# Patient Record
Sex: Female | Born: 1935 | Race: White | Hispanic: No | Smoking: Former smoker
Health system: Southern US, Community
[De-identification: ages and names within clinical notes are randomized; demographics above are authoritative.]

## PROBLEM LIST (undated history)

## (undated) DIAGNOSIS — D649 Anemia, unspecified: Secondary | ICD-10-CM

## (undated) DIAGNOSIS — H409 Unspecified glaucoma: Secondary | ICD-10-CM

## (undated) DIAGNOSIS — I10 Essential (primary) hypertension: Secondary | ICD-10-CM

## (undated) DIAGNOSIS — I639 Cerebral infarction, unspecified: Secondary | ICD-10-CM

## (undated) DIAGNOSIS — H269 Unspecified cataract: Secondary | ICD-10-CM

## (undated) DIAGNOSIS — K5790 Diverticulosis of intestine, part unspecified, without perforation or abscess without bleeding: Secondary | ICD-10-CM

## (undated) DIAGNOSIS — Z853 Personal history of malignant neoplasm of breast: Secondary | ICD-10-CM

## (undated) DIAGNOSIS — F32A Depression, unspecified: Secondary | ICD-10-CM

## (undated) DIAGNOSIS — R911 Solitary pulmonary nodule: Secondary | ICD-10-CM

## (undated) DIAGNOSIS — R32 Unspecified urinary incontinence: Secondary | ICD-10-CM

## (undated) DIAGNOSIS — G629 Polyneuropathy, unspecified: Secondary | ICD-10-CM

## (undated) DIAGNOSIS — F419 Anxiety disorder, unspecified: Secondary | ICD-10-CM

## (undated) DIAGNOSIS — E785 Hyperlipidemia, unspecified: Secondary | ICD-10-CM

## (undated) DIAGNOSIS — E039 Hypothyroidism, unspecified: Secondary | ICD-10-CM

## (undated) DIAGNOSIS — J309 Allergic rhinitis, unspecified: Secondary | ICD-10-CM

## (undated) DIAGNOSIS — F329 Major depressive disorder, single episode, unspecified: Secondary | ICD-10-CM

## (undated) DIAGNOSIS — N189 Chronic kidney disease, unspecified: Secondary | ICD-10-CM

## (undated) DIAGNOSIS — K589 Irritable bowel syndrome without diarrhea: Secondary | ICD-10-CM

## (undated) HISTORY — DX: Essential (primary) hypertension: I10

## (undated) HISTORY — DX: Unspecified glaucoma: H40.9

## (undated) HISTORY — DX: Personal history of malignant neoplasm of breast: Z85.3

## (undated) HISTORY — DX: Solitary pulmonary nodule: R91.1

## (undated) HISTORY — PX: VAGINAL HYSTERECTOMY: SUR661

## (undated) HISTORY — DX: Cerebral infarction, unspecified: I63.9

## (undated) HISTORY — DX: Chronic kidney disease, unspecified: N18.9

## (undated) HISTORY — DX: Hyperlipidemia, unspecified: E78.5

## (undated) HISTORY — DX: Hypothyroidism, unspecified: E03.9

## (undated) HISTORY — DX: Irritable bowel syndrome without diarrhea: K58.9

## (undated) HISTORY — DX: Allergic rhinitis, unspecified: J30.9

## (undated) HISTORY — PX: EYE SURGERY: SHX253

## (undated) HISTORY — PX: SPINE SURGERY: SHX786

## (undated) HISTORY — DX: Anxiety disorder, unspecified: F41.9

## (undated) HISTORY — DX: Unspecified cataract: H26.9

## (undated) HISTORY — DX: Major depressive disorder, single episode, unspecified: F32.9

## (undated) HISTORY — PX: OTHER SURGICAL HISTORY: SHX169

## (undated) HISTORY — DX: Polyneuropathy, unspecified: G62.9

## (undated) HISTORY — DX: Depression, unspecified: F32.A

## (undated) HISTORY — DX: Unspecified urinary incontinence: R32

## (undated) HISTORY — DX: Diverticulosis of intestine, part unspecified, without perforation or abscess without bleeding: K57.90

## (undated) HISTORY — PX: BREAST SURGERY: SHX581

## (undated) HISTORY — DX: Anemia, unspecified: D64.9

---

## 1991-01-03 ENCOUNTER — Encounter (INDEPENDENT_AMBULATORY_CARE_PROVIDER_SITE_OTHER): Payer: Self-pay | Admitting: *Deleted

## 1998-12-26 ENCOUNTER — Encounter: Payer: Self-pay | Admitting: Internal Medicine

## 1999-01-02 ENCOUNTER — Encounter: Payer: Self-pay | Admitting: Internal Medicine

## 1999-04-03 ENCOUNTER — Ambulatory Visit (HOSPITAL_COMMUNITY): Admission: RE | Admit: 1999-04-03 | Discharge: 1999-04-03 | Payer: Self-pay | Admitting: Pulmonary Disease

## 1999-04-03 ENCOUNTER — Encounter: Payer: Self-pay | Admitting: Pulmonary Disease

## 2002-03-20 ENCOUNTER — Encounter: Payer: Self-pay | Admitting: Pulmonary Disease

## 2002-03-20 ENCOUNTER — Inpatient Hospital Stay (HOSPITAL_COMMUNITY): Admission: AD | Admit: 2002-03-20 | Discharge: 2002-03-23 | Payer: Self-pay | Admitting: Pulmonary Disease

## 2002-03-20 ENCOUNTER — Encounter (INDEPENDENT_AMBULATORY_CARE_PROVIDER_SITE_OTHER): Payer: Self-pay | Admitting: *Deleted

## 2002-03-22 ENCOUNTER — Encounter: Payer: Self-pay | Admitting: Pulmonary Disease

## 2002-03-27 ENCOUNTER — Ambulatory Visit (HOSPITAL_COMMUNITY): Admission: RE | Admit: 2002-03-27 | Discharge: 2002-03-27 | Payer: Self-pay | Admitting: Pulmonary Disease

## 2002-03-27 ENCOUNTER — Encounter: Payer: Self-pay | Admitting: Pulmonary Disease

## 2003-06-04 ENCOUNTER — Ambulatory Visit (HOSPITAL_COMMUNITY): Admission: RE | Admit: 2003-06-04 | Discharge: 2003-06-04 | Payer: Self-pay | Admitting: Pulmonary Disease

## 2003-06-04 ENCOUNTER — Encounter: Payer: Self-pay | Admitting: Pulmonary Disease

## 2005-03-09 ENCOUNTER — Ambulatory Visit: Payer: Self-pay | Admitting: Pulmonary Disease

## 2005-09-17 ENCOUNTER — Ambulatory Visit: Payer: Self-pay | Admitting: Pulmonary Disease

## 2005-12-02 ENCOUNTER — Ambulatory Visit: Payer: Self-pay | Admitting: Pulmonary Disease

## 2006-02-25 ENCOUNTER — Ambulatory Visit: Payer: Self-pay | Admitting: Pulmonary Disease

## 2006-07-22 ENCOUNTER — Ambulatory Visit: Payer: Self-pay | Admitting: Pulmonary Disease

## 2006-09-15 ENCOUNTER — Ambulatory Visit: Payer: Self-pay | Admitting: Pulmonary Disease

## 2007-03-14 ENCOUNTER — Ambulatory Visit: Payer: Self-pay | Admitting: Pulmonary Disease

## 2007-03-14 ENCOUNTER — Ambulatory Visit: Payer: Self-pay | Admitting: Family Medicine

## 2007-03-14 LAB — CONVERTED CEMR LAB
ALT: 12 units/L (ref 0–40)
AST: 23 units/L (ref 0–37)
Albumin: 4.1 g/dL (ref 3.5–5.2)
Alkaline Phosphatase: 35 units/L — ABNORMAL LOW (ref 39–117)
Basophils Absolute: 0 10*3/uL (ref 0.0–0.1)
CO2: 30 meq/L (ref 19–32)
Chloride: 102 meq/L (ref 96–112)
Cholesterol: 178 mg/dL (ref 0–200)
Creatinine, Ser: 1.1 mg/dL (ref 0.4–1.2)
Eosinophils Relative: 0.7 % (ref 0.0–5.0)
GFR calc Af Amer: 62 mL/min
Glucose, Bld: 93 mg/dL (ref 70–99)
Hemoglobin: 13.7 g/dL (ref 12.0–15.0)
LDL Cholesterol: 73 mg/dL (ref 0–99)
Monocytes Relative: 9.6 % (ref 3.0–11.0)
Neutro Abs: 5 10*3/uL (ref 1.4–7.7)
Neutrophils Relative %: 69.1 % (ref 43.0–77.0)
RBC: 4.08 M/uL (ref 3.87–5.11)
RDW: 12 % (ref 11.5–14.6)
TSH: 0.33 microintl units/mL — ABNORMAL LOW (ref 0.35–5.50)
VLDL: 34 mg/dL (ref 0–40)

## 2007-09-16 ENCOUNTER — Ambulatory Visit: Payer: Self-pay | Admitting: Pulmonary Disease

## 2007-09-17 DIAGNOSIS — E7800 Pure hypercholesterolemia, unspecified: Secondary | ICD-10-CM | POA: Insufficient documentation

## 2007-09-17 DIAGNOSIS — K219 Gastro-esophageal reflux disease without esophagitis: Secondary | ICD-10-CM

## 2007-09-17 DIAGNOSIS — I10 Essential (primary) hypertension: Secondary | ICD-10-CM

## 2007-09-17 DIAGNOSIS — K589 Irritable bowel syndrome without diarrhea: Secondary | ICD-10-CM

## 2007-09-17 HISTORY — DX: Irritable bowel syndrome, unspecified: K58.9

## 2007-09-20 ENCOUNTER — Ambulatory Visit (HOSPITAL_BASED_OUTPATIENT_CLINIC_OR_DEPARTMENT_OTHER): Admission: RE | Admit: 2007-09-20 | Discharge: 2007-09-20 | Payer: Self-pay | Admitting: Urology

## 2007-10-26 ENCOUNTER — Ambulatory Visit: Payer: Self-pay | Admitting: Pulmonary Disease

## 2007-10-26 ENCOUNTER — Ambulatory Visit (HOSPITAL_COMMUNITY): Admission: RE | Admit: 2007-10-26 | Discharge: 2007-10-26 | Payer: Self-pay | Admitting: Pulmonary Disease

## 2007-10-26 LAB — CONVERTED CEMR LAB
Chloride: 101 meq/L (ref 96–112)
GFR calc non Af Amer: 51 mL/min
Potassium: 4.5 meq/L (ref 3.5–5.1)

## 2007-10-27 ENCOUNTER — Telehealth: Payer: Self-pay | Admitting: Pulmonary Disease

## 2008-03-14 ENCOUNTER — Ambulatory Visit: Payer: Self-pay | Admitting: Pulmonary Disease

## 2008-03-14 DIAGNOSIS — R32 Unspecified urinary incontinence: Secondary | ICD-10-CM

## 2008-03-14 DIAGNOSIS — J309 Allergic rhinitis, unspecified: Secondary | ICD-10-CM

## 2008-03-14 DIAGNOSIS — E039 Hypothyroidism, unspecified: Secondary | ICD-10-CM | POA: Insufficient documentation

## 2008-03-14 DIAGNOSIS — K5790 Diverticulosis of intestine, part unspecified, without perforation or abscess without bleeding: Secondary | ICD-10-CM

## 2008-03-14 HISTORY — DX: Diverticulosis of intestine, part unspecified, without perforation or abscess without bleeding: K57.90

## 2008-03-18 LAB — CONVERTED CEMR LAB
Basophils Absolute: 0 10*3/uL (ref 0.0–0.1)
Bilirubin, Direct: 0.1 mg/dL (ref 0.0–0.3)
CO2: 27 meq/L (ref 19–32)
Chloride: 100 meq/L (ref 96–112)
Cholesterol: 222 mg/dL (ref 0–200)
Creatinine, Ser: 1.1 mg/dL (ref 0.4–1.2)
Eosinophils Absolute: 0.1 10*3/uL (ref 0.0–0.7)
GFR calc Af Amer: 62 mL/min
HCT: 40 % (ref 36.0–46.0)
HDL: 108.5 mg/dL (ref >39.0)
Hemoglobin: 13.7 g/dL (ref 12.0–15.0)
MCHC: 34.1 g/dL (ref 30.0–36.0)
MCV: 96.6 fL (ref 78.0–100.0)
Neutro Abs: 2.9 10*3/uL (ref 1.4–7.7)
Platelets: 334 10*3/uL (ref 150–400)
Potassium: 3.6 meq/L (ref 3.5–5.1)
RDW: 11.9 % (ref 11.5–14.6)
Sodium: 139 meq/L (ref 135–145)
TSH: 1.33 microintl units/mL (ref 0.35–5.50)
Total Bilirubin: 1 mg/dL (ref 0.3–1.2)
Total Protein: 7.9 g/dL (ref 6.0–8.3)
Vit D, 1,25-Dihydroxy: 37 (ref 30–89)

## 2008-05-11 ENCOUNTER — Telehealth (INDEPENDENT_AMBULATORY_CARE_PROVIDER_SITE_OTHER): Payer: Self-pay | Admitting: *Deleted

## 2008-05-24 ENCOUNTER — Telehealth (INDEPENDENT_AMBULATORY_CARE_PROVIDER_SITE_OTHER): Payer: Self-pay

## 2008-06-26 ENCOUNTER — Telehealth (INDEPENDENT_AMBULATORY_CARE_PROVIDER_SITE_OTHER): Payer: Self-pay | Admitting: *Deleted

## 2008-07-03 ENCOUNTER — Encounter: Payer: Self-pay | Admitting: Pulmonary Disease

## 2008-07-12 ENCOUNTER — Encounter: Payer: Self-pay | Admitting: Pulmonary Disease

## 2008-07-19 ENCOUNTER — Telehealth: Payer: Self-pay | Admitting: Pulmonary Disease

## 2008-08-21 ENCOUNTER — Ambulatory Visit: Payer: Self-pay | Admitting: Pulmonary Disease

## 2008-09-02 DIAGNOSIS — H409 Unspecified glaucoma: Secondary | ICD-10-CM

## 2008-10-31 ENCOUNTER — Telehealth: Payer: Self-pay | Admitting: Pulmonary Disease

## 2008-12-05 ENCOUNTER — Telehealth (INDEPENDENT_AMBULATORY_CARE_PROVIDER_SITE_OTHER): Payer: Self-pay | Admitting: *Deleted

## 2009-02-20 ENCOUNTER — Ambulatory Visit: Payer: Self-pay | Admitting: Pulmonary Disease

## 2009-02-20 LAB — CONVERTED CEMR LAB
ALT: 11 units/L (ref 0–35)
BUN: 17 mg/dL (ref 6–23)
Bilirubin, Direct: 0.1 mg/dL (ref 0.0–0.3)
CO2: 26 meq/L (ref 19–32)
Cholesterol: 179 mg/dL (ref 0–200)
Eosinophils Absolute: 0.1 10*3/uL (ref 0.0–0.7)
Eosinophils Relative: 1.2 % (ref 0.0–5.0)
GFR calc non Af Amer: 56.63 mL/min (ref >60)
HDL: 80.6 mg/dL (ref >39.00)
Lymphocytes Relative: 26.4 % (ref 12.0–46.0)
Lymphs Abs: 1.6 10*3/uL (ref 0.7–4.0)
Monocytes Relative: 12 % (ref 3.0–12.0)
Platelets: 289 10*3/uL (ref 150.0–400.0)
Potassium: 3.3 meq/L — ABNORMAL LOW (ref 3.5–5.1)
Total CHOL/HDL Ratio: 2
Total Protein: 7.2 g/dL (ref 6.0–8.3)
Triglycerides: 176 mg/dL — ABNORMAL HIGH (ref 0.0–149.0)
VLDL: 35.2 mg/dL (ref 0.0–40.0)
WBC: 6 10*3/uL (ref 4.5–10.5)

## 2009-03-21 ENCOUNTER — Telehealth (INDEPENDENT_AMBULATORY_CARE_PROVIDER_SITE_OTHER): Payer: Self-pay | Admitting: *Deleted

## 2009-04-17 ENCOUNTER — Encounter: Payer: Self-pay | Admitting: Pulmonary Disease

## 2009-04-19 ENCOUNTER — Telehealth (INDEPENDENT_AMBULATORY_CARE_PROVIDER_SITE_OTHER): Payer: Self-pay | Admitting: *Deleted

## 2009-04-26 ENCOUNTER — Ambulatory Visit: Payer: Self-pay | Admitting: Pulmonary Disease

## 2009-04-26 ENCOUNTER — Telehealth (INDEPENDENT_AMBULATORY_CARE_PROVIDER_SITE_OTHER): Payer: Self-pay | Admitting: *Deleted

## 2009-05-03 ENCOUNTER — Telehealth (INDEPENDENT_AMBULATORY_CARE_PROVIDER_SITE_OTHER): Payer: Self-pay | Admitting: *Deleted

## 2009-08-21 ENCOUNTER — Ambulatory Visit: Payer: Self-pay | Admitting: Pulmonary Disease

## 2009-08-21 ENCOUNTER — Ambulatory Visit: Payer: Self-pay | Admitting: Family Medicine

## 2009-08-27 LAB — CONVERTED CEMR LAB
Albumin: 4.1 g/dL (ref 3.5–5.2)
Alkaline Phosphatase: 32 units/L — ABNORMAL LOW (ref 39–117)
Bilirubin, Direct: 0.1 mg/dL (ref 0.0–0.3)
GFR calc non Af Amer: 63.87 mL/min (ref >60)
Glucose, Bld: 93 mg/dL (ref 70–99)
HDL: 77.3 mg/dL (ref >39.00)
LDL Cholesterol: 70 mg/dL (ref 0–99)
Sodium: 139 meq/L (ref 135–145)
Total Bilirubin: 0.5 mg/dL (ref 0.3–1.2)
Total Protein: 7.5 g/dL (ref 6.0–8.3)
Triglycerides: 174 mg/dL — ABNORMAL HIGH (ref 0.0–149.0)
VLDL: 34.8 mg/dL (ref 0.0–40.0)

## 2009-08-30 ENCOUNTER — Encounter: Payer: Self-pay | Admitting: Pulmonary Disease

## 2009-09-05 ENCOUNTER — Encounter: Payer: Self-pay | Admitting: Pulmonary Disease

## 2009-09-06 ENCOUNTER — Telehealth: Payer: Self-pay | Admitting: Pulmonary Disease

## 2009-09-09 ENCOUNTER — Encounter: Payer: Self-pay | Admitting: Pulmonary Disease

## 2009-09-12 ENCOUNTER — Encounter: Payer: Self-pay | Admitting: Pulmonary Disease

## 2009-09-26 ENCOUNTER — Encounter (INDEPENDENT_AMBULATORY_CARE_PROVIDER_SITE_OTHER): Payer: Self-pay | Admitting: General Surgery

## 2009-09-26 ENCOUNTER — Ambulatory Visit (HOSPITAL_COMMUNITY): Admission: RE | Admit: 2009-09-26 | Discharge: 2009-09-27 | Payer: Self-pay | Admitting: General Surgery

## 2009-10-04 ENCOUNTER — Encounter: Payer: Self-pay | Admitting: Pulmonary Disease

## 2009-10-07 ENCOUNTER — Ambulatory Visit: Payer: Self-pay | Admitting: Oncology

## 2009-10-11 ENCOUNTER — Encounter: Payer: Self-pay | Admitting: Pulmonary Disease

## 2009-10-15 ENCOUNTER — Ambulatory Visit: Admission: RE | Admit: 2009-10-15 | Discharge: 2009-11-06 | Payer: Self-pay | Admitting: Radiation Oncology

## 2009-10-16 ENCOUNTER — Encounter: Payer: Self-pay | Admitting: Pulmonary Disease

## 2009-10-18 ENCOUNTER — Encounter: Payer: Self-pay | Admitting: Pulmonary Disease

## 2009-10-18 LAB — COMPREHENSIVE METABOLIC PANEL
ALT: 18 U/L (ref 0–35)
AST: 24 U/L (ref 0–37)
Albumin: 4.6 g/dL (ref 3.5–5.2)
Alkaline Phosphatase: 41 U/L (ref 39–117)
BUN: 21 mg/dL (ref 6–23)
Calcium: 10.2 mg/dL (ref 8.4–10.5)
Chloride: 101 mEq/L (ref 96–112)
Creatinine, Ser: 1.22 mg/dL — ABNORMAL HIGH (ref 0.40–1.20)
Potassium: 4 mEq/L (ref 3.5–5.3)

## 2009-10-18 LAB — CBC WITH DIFFERENTIAL/PLATELET
BASO%: 0.7 % (ref 0.0–2.0)
Basophils Absolute: 0 10*3/uL (ref 0.0–0.1)
EOS%: 3.6 % (ref 0.0–7.0)
MCH: 33.6 pg (ref 25.1–34.0)
MCHC: 34.4 g/dL (ref 31.5–36.0)
MCV: 97.6 fL (ref 79.5–101.0)
MONO%: 15.6 % — ABNORMAL HIGH (ref 0.0–14.0)
RDW: 12.7 % (ref 11.2–14.5)
lymph#: 2.3 10*3/uL (ref 0.9–3.3)

## 2009-11-25 ENCOUNTER — Telehealth: Payer: Self-pay | Admitting: Pulmonary Disease

## 2009-12-05 ENCOUNTER — Encounter: Payer: Self-pay | Admitting: Pulmonary Disease

## 2010-01-07 ENCOUNTER — Encounter: Payer: Self-pay | Admitting: Pulmonary Disease

## 2010-01-15 ENCOUNTER — Ambulatory Visit: Payer: Self-pay | Admitting: Oncology

## 2010-01-17 ENCOUNTER — Encounter: Payer: Self-pay | Admitting: Pulmonary Disease

## 2010-01-17 LAB — COMPREHENSIVE METABOLIC PANEL
ALT: 16 U/L (ref 0–35)
AST: 22 U/L (ref 0–37)
Glucose, Bld: 88 mg/dL (ref 70–99)
Potassium: 3.9 mEq/L (ref 3.5–5.3)

## 2010-01-17 LAB — CBC WITH DIFFERENTIAL/PLATELET
BASO%: 0.6 % (ref 0.0–2.0)
Basophils Absolute: 0 10*3/uL (ref 0.0–0.1)
EOS%: 2.6 % (ref 0.0–7.0)
Eosinophils Absolute: 0.1 10*3/uL (ref 0.0–0.5)
LYMPH%: 43.8 % (ref 14.0–49.7)
MCH: 33.9 pg (ref 25.1–34.0)
MONO#: 0.6 10*3/uL (ref 0.1–0.9)
MONO%: 15.5 % — ABNORMAL HIGH (ref 0.0–14.0)
NEUT#: 1.5 10*3/uL (ref 1.5–6.5)
NEUT%: 37.5 % — ABNORMAL LOW (ref 38.4–76.8)
Platelets: 246 10*3/uL (ref 145–400)
RDW: 13 % (ref 11.2–14.5)
lymph#: 1.8 10*3/uL (ref 0.9–3.3)

## 2010-02-19 ENCOUNTER — Ambulatory Visit: Payer: Self-pay | Admitting: Pulmonary Disease

## 2010-02-19 DIAGNOSIS — C50919 Malignant neoplasm of unspecified site of unspecified female breast: Secondary | ICD-10-CM

## 2010-02-23 LAB — CONVERTED CEMR LAB
ALT: 21 units/L (ref 0–35)
BUN: 31 mg/dL — ABNORMAL HIGH (ref 6–23)
Basophils Absolute: 0 10*3/uL (ref 0.0–0.1)
Basophils Relative: 0.8 % (ref 0.0–3.0)
CO2: 30 meq/L (ref 19–32)
Calcium: 10.7 mg/dL — ABNORMAL HIGH (ref 8.4–10.5)
Cholesterol: 199 mg/dL (ref 0–200)
Creatinine, Ser: 1.4 mg/dL — ABNORMAL HIGH (ref 0.4–1.2)
Eosinophils Absolute: 0.1 10*3/uL (ref 0.0–0.7)
HDL: 87.5 mg/dL (ref >39.00)
Lymphocytes Relative: 37.6 % (ref 12.0–46.0)
MCHC: 33.8 g/dL (ref 30.0–36.0)
MCV: 98.3 fL (ref 78.0–100.0)
Monocytes Relative: 13.6 % — ABNORMAL HIGH (ref 3.0–12.0)
Neutro Abs: 2.2 10*3/uL (ref 1.4–7.7)
Neutrophils Relative %: 45.6 % (ref 43.0–77.0)
Platelets: 283 10*3/uL (ref 150.0–400.0)
RBC: 3.8 M/uL — ABNORMAL LOW (ref 3.87–5.11)
RDW: 12.9 % (ref 11.5–14.6)
Total CHOL/HDL Ratio: 2
VLDL: 27.8 mg/dL (ref 0.0–40.0)

## 2010-04-14 ENCOUNTER — Ambulatory Visit: Payer: Self-pay | Admitting: Oncology

## 2010-04-16 ENCOUNTER — Encounter: Payer: Self-pay | Admitting: Pulmonary Disease

## 2010-04-16 LAB — COMPREHENSIVE METABOLIC PANEL
ALT: 12 U/L (ref 0–35)
AST: 20 U/L (ref 0–37)
BUN: 36 mg/dL — ABNORMAL HIGH (ref 6–23)
Chloride: 105 mEq/L (ref 96–112)
Glucose, Bld: 93 mg/dL (ref 70–99)
Total Bilirubin: 0.2 mg/dL — ABNORMAL LOW (ref 0.3–1.2)

## 2010-04-16 LAB — CBC WITH DIFFERENTIAL/PLATELET
LYMPH%: 35 % (ref 14.0–49.7)
MCHC: 34.4 g/dL (ref 31.5–36.0)
MCV: 97.3 fL (ref 79.5–101.0)
MONO#: 0.9 10*3/uL (ref 0.1–0.9)
NEUT#: 2.2 10*3/uL (ref 1.5–6.5)
RBC: 3.65 10*6/uL — ABNORMAL LOW (ref 3.70–5.45)
RDW: 12.7 % (ref 11.2–14.5)
WBC: 5 10*3/uL (ref 3.9–10.3)
lymph#: 1.7 10*3/uL (ref 0.9–3.3)

## 2010-05-29 ENCOUNTER — Encounter: Payer: Self-pay | Admitting: Pulmonary Disease

## 2010-05-30 ENCOUNTER — Telehealth: Payer: Self-pay | Admitting: Pulmonary Disease

## 2010-06-08 ENCOUNTER — Inpatient Hospital Stay (HOSPITAL_COMMUNITY): Admission: EM | Admit: 2010-06-08 | Discharge: 2010-06-10 | Payer: Self-pay | Admitting: Emergency Medicine

## 2010-06-09 ENCOUNTER — Encounter (INDEPENDENT_AMBULATORY_CARE_PROVIDER_SITE_OTHER): Payer: Self-pay | Admitting: Neurology

## 2010-06-10 ENCOUNTER — Encounter (INDEPENDENT_AMBULATORY_CARE_PROVIDER_SITE_OTHER): Payer: Self-pay | Admitting: *Deleted

## 2010-06-11 ENCOUNTER — Telehealth: Payer: Self-pay | Admitting: Pulmonary Disease

## 2010-06-26 ENCOUNTER — Ambulatory Visit: Payer: Self-pay | Admitting: Pulmonary Disease

## 2010-06-27 DIAGNOSIS — I679 Cerebrovascular disease, unspecified: Secondary | ICD-10-CM

## 2010-07-15 ENCOUNTER — Ambulatory Visit: Payer: Self-pay | Admitting: Oncology

## 2010-07-16 ENCOUNTER — Encounter: Payer: Self-pay | Admitting: Pulmonary Disease

## 2010-07-17 ENCOUNTER — Encounter: Payer: Self-pay | Admitting: Pulmonary Disease

## 2010-07-17 LAB — COMPREHENSIVE METABOLIC PANEL
ALT: 11 U/L (ref 0–35)
Albumin: 4.4 g/dL (ref 3.5–5.2)
Alkaline Phosphatase: 59 U/L (ref 39–117)
CO2: 25 mEq/L (ref 19–32)
Chloride: 104 mEq/L (ref 96–112)
Creatinine, Ser: 1.28 mg/dL — ABNORMAL HIGH (ref 0.40–1.20)
Sodium: 142 mEq/L (ref 135–145)
Total Bilirubin: 0.3 mg/dL (ref 0.3–1.2)

## 2010-07-17 LAB — CBC WITH DIFFERENTIAL/PLATELET
BASO%: 0.7 % (ref 0.0–2.0)
Eosinophils Absolute: 0.1 10*3/uL (ref 0.0–0.5)
HCT: 32.8 % — ABNORMAL LOW (ref 34.8–46.6)
HGB: 11.5 g/dL — ABNORMAL LOW (ref 11.6–15.9)
MCH: 33.9 pg (ref 25.1–34.0)
MCHC: 35 g/dL (ref 31.5–36.0)
MCV: 96.8 fL (ref 79.5–101.0)
MONO%: 19.1 % — ABNORMAL HIGH (ref 0.0–14.0)

## 2010-09-03 ENCOUNTER — Encounter: Payer: Self-pay | Admitting: Pulmonary Disease

## 2010-10-02 ENCOUNTER — Encounter: Payer: Self-pay | Admitting: Pulmonary Disease

## 2010-11-04 ENCOUNTER — Ambulatory Visit: Payer: Self-pay | Admitting: Oncology

## 2010-11-06 ENCOUNTER — Encounter: Payer: Self-pay | Admitting: Pulmonary Disease

## 2010-11-06 LAB — CBC WITH DIFFERENTIAL/PLATELET
BASO%: 0.7 % (ref 0.0–2.0)
HCT: 33.3 % — ABNORMAL LOW (ref 34.8–46.6)
MCH: 33.7 pg (ref 25.1–34.0)
MCHC: 34.3 g/dL (ref 31.5–36.0)
MCV: 98.1 fL (ref 79.5–101.0)
MONO%: 18.5 % — ABNORMAL HIGH (ref 0.0–14.0)
Platelets: 355 10*3/uL (ref 145–400)
WBC: 4.8 10*3/uL (ref 3.9–10.3)

## 2010-11-06 LAB — COMPREHENSIVE METABOLIC PANEL
Chloride: 104 mEq/L (ref 96–112)
Creatinine, Ser: 1.41 mg/dL — ABNORMAL HIGH (ref 0.40–1.20)
Potassium: 4.5 mEq/L (ref 3.5–5.3)
Sodium: 140 mEq/L (ref 135–145)
Total Bilirubin: 0.4 mg/dL (ref 0.3–1.2)

## 2010-12-25 ENCOUNTER — Ambulatory Visit: Admit: 2010-12-25 | Payer: Self-pay | Admitting: Pulmonary Disease

## 2010-12-25 ENCOUNTER — Ambulatory Visit (INDEPENDENT_AMBULATORY_CARE_PROVIDER_SITE_OTHER): Payer: Medicare Other | Admitting: Pulmonary Disease

## 2010-12-25 ENCOUNTER — Other Ambulatory Visit: Payer: Self-pay | Admitting: Pulmonary Disease

## 2010-12-25 ENCOUNTER — Other Ambulatory Visit: Payer: Medicare Other

## 2010-12-25 ENCOUNTER — Telehealth: Payer: Self-pay | Admitting: Internal Medicine

## 2010-12-25 ENCOUNTER — Encounter: Payer: Self-pay | Admitting: Pulmonary Disease

## 2010-12-25 DIAGNOSIS — D649 Anemia, unspecified: Secondary | ICD-10-CM

## 2010-12-25 DIAGNOSIS — E7800 Pure hypercholesterolemia, unspecified: Secondary | ICD-10-CM

## 2010-12-25 DIAGNOSIS — I10 Essential (primary) hypertension: Secondary | ICD-10-CM

## 2010-12-25 DIAGNOSIS — I679 Cerebrovascular disease, unspecified: Secondary | ICD-10-CM

## 2010-12-25 DIAGNOSIS — E039 Hypothyroidism, unspecified: Secondary | ICD-10-CM

## 2010-12-25 DIAGNOSIS — E785 Hyperlipidemia, unspecified: Secondary | ICD-10-CM

## 2010-12-25 LAB — HEPATIC FUNCTION PANEL
ALT: 11 U/L (ref 0–35)
AST: 22 U/L (ref 0–37)
Alkaline Phosphatase: 46 U/L (ref 39–117)
Bilirubin, Direct: 0.1 mg/dL (ref 0.0–0.3)

## 2010-12-25 LAB — BASIC METABOLIC PANEL
Calcium: 10.1 mg/dL (ref 8.4–10.5)
Creatinine, Ser: 1.3 mg/dL — ABNORMAL HIGH (ref 0.4–1.2)
GFR: 40.21 mL/min — ABNORMAL LOW (ref >60.00)

## 2010-12-25 LAB — CBC WITH DIFFERENTIAL/PLATELET
Basophils Absolute: 0 10*3/uL (ref 0.0–0.1)
Eosinophils Absolute: 0.1 10*3/uL (ref 0.0–0.7)
Eosinophils Relative: 1.7 % (ref 0.0–5.0)
Lymphocytes Relative: 28.3 % (ref 12.0–46.0)
Lymphs Abs: 1.4 10*3/uL (ref 0.7–4.0)
Monocytes Absolute: 0.7 10*3/uL (ref 0.1–1.0)
Neutro Abs: 2.8 10*3/uL (ref 1.4–7.7)
RDW: 13.1 % (ref 11.5–14.6)

## 2010-12-25 LAB — LIPID PANEL
Total CHOL/HDL Ratio: 3
VLDL: 32.6 mg/dL (ref 0.0–40.0)

## 2010-12-25 NOTE — Progress Notes (Signed)
Summary: rx req/ cough/ ears  Phone Note Call from Patient   Caller: Patient Call For: nadel Summary of Call: dry cough. ears stopped up. req rx/ nurse [facility]. [facility] [address]  Initial call taken by: Tivis Ringer,  May 11, 2008 2:18 PM  Follow-up for Phone Call        Dry cough x3-4 days. PND and ears pop. Pt is taking Zyrtec for allergies but would like to have something called to pharmacy for the cough. Michel Bickers [facility]  May 11, 2008 2:23 PM  Additional Follow-up for Phone Call Additional follow up Details #1::        per SN, pt is to take Mucinex DM 2 twice a day with lots of fluids, and tussionex 1tsp every 12 hours as needed for cough.  pt is aware, script called to pharmacy. Additional Follow-up by: Boone Master CNA,  May 11, 2008 4:55 PM    New/Updated Medications: MUCINEX DM 30-600 MG  TB12 (DEXTROMETHORPHAN-GUAIFENESIN) take 2 tablets by mouth two times a day with lots of fluids TUSSIONEX PENNKINETIC ER 8-10 MG/5ML  LQCR (CHLORPHENIRAMINE-HYDROCODONE) take one teaspoon by mouth every 12 hours as needed for cough   Prescriptions: TUSSIONEX PENNKINETIC ER 8-10 MG/5ML  LQCR (CHLORPHENIRAMINE-HYDROCODONE) take one teaspoon by mouth every 12 hours as needed for cough  #4 oz x 0   Entered and Authorized by:   Marijo File CMA   Signed by:   Boone Master CNA on 05/11/2008   Method used:   Telephoned to ...       [facility] [address]  [id number]       [address]       [place], [place]  [address]       Ph: [phone] or [phone]       Fax: [phone]   RxID:   [id number]

## 2010-12-25 NOTE — Letter (Signed)
Summary: [facility]   Imported By: Lester [place] 01/31/2010 09:20:45  _____________________________________________________________________  External Attachment:    Type:   Image     Comment:   External Document

## 2010-12-25 NOTE — Letter (Signed)
Summary: [facility]  [facility]   Imported By: Sherian Rein 08/04/2010 13:28:37  _____________________________________________________________________  External Attachment:    Type:   Image     Comment:   External Document

## 2010-12-25 NOTE — Letter (Signed)
[facility]   Imported By: Sherian Rein 05/12/2010 09:23:12  _____________________________________________________________________  External Attachment:    Type:   Image     Comment:   External Document

## 2010-12-25 NOTE — Letter (Signed)
[facility]   Imported By: Lester [place] 02/12/2010 09:25:15  _____________________________________________________________________  External Attachment:    Type:   Image     Comment:   External Document

## 2010-12-25 NOTE — Letter (Signed)
Summary: [facility]   Imported By: Sherian Rein 10/27/2010 08:34:18  _____________________________________________________________________  External Attachment:    Type:   Image     Comment:   External Document

## 2010-12-25 NOTE — Letter (Signed)
Summary: [facility]  [facility]   Imported By: Sherian Rein 11/18/2010 15:56:45  _____________________________________________________________________  External Attachment:    Type:   Image     Comment:   External Document

## 2010-12-25 NOTE — Letter (Signed)
Summary: [facility]   Imported By: Lester [place] 12/21/2009 11:35:48  _____________________________________________________________________  External Attachment:    Type:   Image     Comment:   External Document

## 2010-12-25 NOTE — Progress Notes (Signed)
Summary: need ov visit  Phone Note Call from Patient   Caller: Patient Call For: Adel Neyer Summary of Call: pt need 1 to 2 week hfu Initial call taken by: Rickard Patience,  June 11, 2010 8:16 AM  Follow-up for Phone Call        called and spoke with pt ---scheduled appt for 8-4 at 10:30 for hfu for pt.  pt is aware of date and time of appt. Randell Loop CMA  June 11, 2010 8:49 AM

## 2010-12-25 NOTE — Miscellaneous (Signed)
Summary: Plan of Care & Treatment/[facility]  Plan of Care & Treatment/[facility]   Imported By: Sherian Rein 07/23/2010 13:54:26  _____________________________________________________________________  External Attachment:    Type:   Image     Comment:   External Document

## 2010-12-25 NOTE — Progress Notes (Signed)
Summary: prescription  Phone Note Call from Patient Call back at Home Phone [phone]   Caller: Patient Call For: nadel Summary of Call: wants to speak to nurse, re: prescription. Initial call taken by: Darletta Moll,  November 25, 2009 8:39 AM  Follow-up for Phone Call        Pt request refills on Lisinopril, Atenolol, Palvix, simvastatin, levoxyl, potassium chloride. Pt also states she has stopped taking estradiol. I have removed this from med list. Pt requst rx be [facility]. RX sent. pt aware. Carron Curie CMA  November 25, 2009 9:36 AM     Prescriptions: LEVOXYL 50 MCG  TABS (LEVOTHYROXINE SODIUM) take one tablet on an empty stomach  #90 x 3   Entered by:   Carron Curie CMA   Authorized by:   Michele Mcalpine MD   Signed by:   Carron Curie CMA on 11/25/2009   Method used:   Faxed to ...       [facility]             , [place]         Ph:        Fax: [phone]   RxID:   [id number] SIMVASTATIN 20 MG  TABS (SIMVASTATIN) take one tablet at bedtime  #90 x 3   Entered by:   Carron Curie CMA   Authorized by:   Michele Mcalpine MD   Signed by:   Carron Curie CMA on 11/25/2009   Method used:   Faxed to ...       [facility]order[facility]             , [place]         Ph:        Fax: [phone]   RxID:   [id number] POTASSIUM CHLORIDE CRYS CR 20 MEQ  TBCR (POTASSIUM CHLORIDE CRYS CR) Take 1 tablet by mouth once a day  #90 x 3   Entered by:   Carron Curie CMA   Authorized by:   Michele Mcalpine MD   Signed by:   Carron Curie CMA on 11/25/2009   Method used:   Faxed to ...       [facility]order)             , [place]         Ph:        Fax: [phone]   RxID:   [id number] LISINOPRIL 20 MG  TABS (LISINOPRIL) take 1 tab by mouth once daily...  #90 x 3   Entered by:   Carron Curie CMA   Authorized by:    Michele Mcalpine MD   Signed by:   Carron Curie CMA on 11/25/2009   Method used:   Faxed to ...       [facility]             , [place]         Ph:        Fax: [phone]   RxID:   [id number] ATENOLOL-CHLORTHALIDONE 50-25 MG  TABS (ATENOLOL-CHLORTHALIDONE) Take 1 tablet by mouth once a day  #90 x 3   Entered by:   Carron Curie CMA   Authorized by:   Michele Mcalpine MD   Signed by:   Carron Curie CMA on 11/25/2009   Method used:   Faxed to .[facility] Kitchen[facility]  [facility]             , [place]         Ph:        Fax: [phone]   RxID:   [id number] PLAVIX 75 MG TABS (CLOPIDOGREL BISULFATE) take 1 tab by mouth once daily...  #90 x 3   Entered by:   Carron Curie CMA   Authorized by:   Michele Mcalpine MD   Signed by:   Carron Curie CMA on 11/25/2009   Method used:   Faxed to ...       [facility]             , [place]         Ph:        Fax: [phone]   RxID:   [id number]

## 2010-12-25 NOTE — Progress Notes (Signed)
Summary: JOINT PAIN  Phone Note Call from Patient   Caller: Patient Call For: NADEL Summary of Call: PAIN IN JOINTS ARMS  ELBOW AND FINGERS [facility] Initial call taken by: Rickard Patience,  May 30, 2010 9:43 AM  Follow-up for Phone Call        Pt c/o right elbow hot to the touch, bilateral elbow, hand pain x 1 week, not getting better even with taking Tylenol Arthritis 650mg  twice a day, and "just don't feel good". Please advise. Thanks. Zackery Barefoot CMA  May 30, 2010 10:00 AM    Additional Follow-up for Phone Call Additional follow up Details #1::        per SN---call in medrol dosepak  or a sterapred dosepak  5mg   6 day pack  #1  take as directed.  thanks Randell Loop CMA  May 30, 2010 11:41 AM     Additional Follow-up for Phone Call Additional follow up Details #2::    Rx sent pt informed. Zackery Barefoot CMA  May 30, 2010 11:56 AM   New/Updated Medications: PREDNISONE (PAK) 5 MG TABS (PREDNISONE) as directed x 6 days Prescriptions: PREDNISONE (PAK) 5 MG TABS (PREDNISONE) as directed x 6 days  #1 x 0   Entered by:   Zackery Barefoot CMA   Authorized by:   Michele Mcalpine MD   Signed by:   Zackery Barefoot CMA on 05/30/2010   Method used:   Electronically to        [facility] [address]       [address]       [place], [place]  [address]       Ph: [phone] or [phone]       Fax: [phone]   RxID:   [id number]

## 2010-12-25 NOTE — Assessment & Plan Note (Signed)
Summary: Alison Cordova   CC:  Post hosp follow up visit....  History of Present Illness: 77 y/o WF here for a follow up visit... she has mult med problems as noted below... she is HOH and had a cochlear implant yrs ago... she is 77 y/o and rather frail (lives w/ her daughter & her husb)...   ~  February 20, 2009:  she has been doing reasonably well over the last 50mo... notes some GERD symptoms intermittently and we discussed trial of Prilosec OTC daily for 1 month to see if her symptoms abate... also c/o pain on ball of left foot when walking- exam shows prob plantar wart in this area and we discussed refer to Podiatry...   ~  August 21, 2009:  DrHollander sent her to a retina specialist & she saw DrSanders 5/10 w/ macular edema & hemorrhage- s/p laser Rx (& repeated last week)... otherw stable without other complaints or concerns...   ~  February 19, 2010:  after she was here last she had an abn mammogram and subseq surg by [facility]-  left total mastectomy & sentinel node bx= 2.2cm invasive ductal carcinoma & 1/3 nodes pos for micromets... oncology per [facility] on aromatase inhibitor ARIMIDEX 1mg /d... last seen 01/17/10 f/u & note reviewed... she continues to see DrSanders for Ophthalmology w/ series of AVASTIN shots that seem to be helping according to the pt....  BP controlled on meds, due for CXR (NAD- RML scarring, left mastectomy, DJD sp) & fasting labs (all essent WNL- creat=1.4)... OK TDAP today.   ~  June 26, 2010:  Hosp 7/17-19 w/ TIA (right facial numbness & tingling which resolved spont)... MRI w/o acute change- atrophy, sm vessel dis, scat lacunes, CSpine dis;  MRA showed mild to mod atherosclerotic changes, 50% left vertebral stenosis, ?3mm aneurysmal dil right paraophthalmic art... ONLY MED CHANGE WAS PLAVIX SWITCHED TO "POINT STUDY" DRUG (Placebo vs Plavix)> followed by DrSethi... BP controlled on meds;  Chol looks good on Simva20;  Thyroid regulated w/ Levothy50;  Prilosec changed to PEPCID  20mg /d...     Current Problem List:  GLAUCOMA (ICD-365.9) - followed by DrHollander on gtts...  ~  5/10: sent to retina specialist & she saw DrSanders 5/10 w/ macular edema & hemorrhage- s/p laser Rx x2...  ~  subseq started on series of AVASTIN shots in the eyes that seem to be helping.  ALLERGIC RHINITIS (ICD-477.9) - on OTC Zyretek, FLONASE and ASTELIN prn...   BRONCHITIS, RECURRENT (ICD-491.9) - she is an ex-smoker... she had hx of LLL pneumonia in 2000... she denies cough, sputum, change in SOB, etc...  ~  CXR 3/11 showed RML scarring, otherw clear lungs, DJD sp, left mastectomy...  HYPERTENSION (ICD-401.9) - on ATENOLOL/Hct 50-25 daily + K20/d & LISINOPRIL 20mg /d...  BP=142/70 today and similar on home checks... she denies CP, palpit, change in SOB...   ~  labs 3/10 showed K= 3.3, BUN= 17, Creat= 1.0... rec> increased the KCl to .  ~  labs 9/10 showed K= 3.9, BUN= 17, Creat= 0.9... rec> OK to keep K at 1/d.  CEREBROVASCULAR DISEASE (ICD-437.9) - ** SEE ABOVE **  HYPERCHOLESTEROLEMIA (ICD-272.0) - on SIMVASTATIN 20mg /d + Fish Oil daily...  ~  FLP 4/08 on Simva20 showed TChol 178, TG 171, HDL 71, LDL 73  ~  FLP 4/09 showed TChol 222, TG 112, HDL 109, LDL 95... continue same- GREAT HDL!!!  ~  FLP 3/10 on Simva20 showed TChol 179, TG 176, HDL 81, LDL 63... continue same.  ~  FLP 9/10 on Simva 20 showed TChol 182, TG 174, HDL 77, LDL 70  ~  FLP 3/11 on Simva20 showed TChol 199, TG 139, HDL 88, LDL 84  ~  FLP 7/11 in hosp on Simva20 showed TChol 180, TG 186, HDL 60, LDL 83  HYPOTHYROIDISM (ICD-244.9) - on SYNTHROID 57mcg/d...  ~  labs 4/08 showed TSH = 0.33  ~  labs 4/09 showed TSH= 1.33  ~  labs 3/10 showed TSH= 1.42  ~  labs 3/11 showed TSH= 1.59  ~  labs 7/11 in hosp showed TSH= 1.63 GERD (ICD-530.81) - she has noted some GERD symptoms: rec> Pepcid 20mg  OTC daily but she uses Prn. DIVERTICULOSIS OF COLON (ICD-562.10) -   ~  last colonoscopy 11/06 by DrBrodie was  normal, no change from 2000... IRRITABLE BOWEL SYNDROME (ICD-564.1)  Hx of URINARY INCONTINENCE (ICD-788.30) - she saw DrPeterson and had urodynamic studies in 2008... she wears pads.  ADENOCARCINOMA, LEFT BREAST (ICD-174.9) - diagnosed w/ left breast adenocarcinoma 11/10 w/ total left mastectomy, sentinel node bx (micro mets in 1/3 nodes), & adjuvant hormonal therapy w/ ANASTROZOLE 1mg /d followed by Lenis Noon since 11/10.  ~  last seen by Oncology 5/11 & by GenSurg 7/11> notes reviewed...  DEGENERATIVE JOINT DISEASE, GENERALIZED (ICD-715.00)  CVA (ICD-434.91) - on ASA 325mg /d and now POINT Study drug (Placebo vs Plavix)...   ~  she was seen by [facility] in 2001 w/ studies showing bilat sm vessel ischemic strokes...  ~  [facility] 7/11 w/ TIA ** SEE ABOVE **  HEADACHE (ICD-784.0) Hx of PERIPHERAL NEUROPATHY (ICD-356.9) - takes NORTRIPTYLINE 10mg - 3tabsQhs (per Psyche)... c/o her feet bothering her at night...   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 incr stress- 56y/o son w/ MI...  ANEMIA (ICD-285.9)  ~  abs 4/09 showed Hg= 13.7  ~  labs 3/11 showed Hg= 12.6  ~  labs 7/11 in hosp showed Hg= 12.6  Health Maintenance - GYN = DrMcPail on Estradiol 1mg /d... Mammograms at [facility] (Mammogram 10/10 w/ left breast mass- referred to [facility] & Bx pos for infiltrating ductal cancer)...  BMD here 9/10 showed TScores +5.3 in spine & -0.2 in [facility]... Vit D level 4/09 was 37 & Rx w/ OTC 1000 u daily... she get yearly Flu shots;  had Pneumovax 104yrs ago;  given TDAP 3/11.   Preventive Screening-Counseling & Management  Alcohol-Tobacco     Smoking Status: quit     Year Quit: 1964  Allergies (verified): No Known Drug Allergies  Comments:  Nurse/Medical Assistant: The patient's medications and allergies were reviewed with the patient and were updated in the Medication and Allergy Lists.  Past History:  Past Medical  History: GLAUCOMA (ICD-365.9) - hx macular edema & hemorrhage Rx by DrSanders. ALLERGIC RHINITIS (ICD-477.9) BRONCHITIS, RECURRENT (ICD-491.9) HYPERTENSION (ICD-401.9) CEREBROVASCULAR DISEASE (ICD-437.9) HYPERCHOLESTEROLEMIA (ICD-272.0) HYPOTHYROIDISM (ICD-244.9) GERD (ICD-530.81) DIVERTICULOSIS OF COLON (ICD-562.10) IRRITABLE BOWEL SYNDROME (ICD-564.1) Hx of URINARY INCONTINENCE (ICD-788.30) ADENOCARCINOMA, LEFT BREAST (ICD-174.9) DEGENERATIVE JOINT DISEASE, GENERALIZED (ICD-715.00) CVA (ICD-434.91) HEADACHE (ICD-784.0) Hx of PERIPHERAL NEUROPATHY (ICD-356.9) DYSTHYMIA (ICD-300.4) ANEMIA (ICD-285.9)  Past Surgical History: S/P nissen fundoplication at [facility] S/P lumbar laminectomy S/P pubovaginal sling surgery for incontinence S/P left mastectomy & sentinel node bx 10/10 DrHIngram  Family History: Reviewed history from 02/19/2010 and no changes required. pt had left breast removed and 3 lymph nodes in nov 2010 due to cancer.  Review of Systems      See HPI       The  patient complains of decreased hearing and dyspnea on exertion.  The patient denies anorexia, fever, weight loss, weight gain, vision loss, hoarseness, chest pain, syncope, peripheral edema, prolonged cough, headaches, hemoptysis, abdominal pain, melena, hematochezia, severe indigestion/heartburn, hematuria, incontinence, muscle weakness, suspicious skin lesions, transient blindness, difficulty walking, depression, unusual weight change, abnormal bleeding, enlarged lymph nodes, and angioedema.    Vital Signs:  Patient profile:   77 year old female Height:      58 inches Weight:      116.25 pounds BMI:     24.38 O2 Sat:      97 % on Room air Temp:     97.6 degrees F oral Pulse rate:   80 / minute BP sitting:   142 / 78  (right arm) Cuff size:   regular  Vitals Entered By: Randell Loop CMA (June 26, 2010 10:23 AM)  O2 Sat at Rest %:  97 O2 Flow:  Room air CC: Post hosp follow up visit... Is Patient  Diabetic? No Pain Assessment Patient in pain? no      Comments meds updated today with pt   Physical Exam  Additional Exam:  WD, Thin, 77 y/o WF in NAD... she is chr ill appearing...  GENERAL:  Alert & oriented; pleasant & cooperative... HEENT:  [place]/AT, EACs-clear, TMs-wnl, Cochlear implant, NOSE-clear, THROAT-clear & wnl. NECK:  Supple w/ fairROM; no JVD; normal carotid impulses w/o bruits; no thyromegaly or nodules palpated; no lymphadenopathy. CHEST:  s/p left mastectomy, clear lungs w/o wheezing, rales, or rhonchi... HEART:  Regular Rhythm;  g 1/6 SEM without rubs or gallops... ABDOMEN:  Soft & nontender; normal bowel sounds; no organomegaly or masses detected. EXT: without deformities, mod arthritic changes; no varicose veins/ +venous insuffic/ no edema.  NEURO:  CN's intact;  no asymmetry;  no focal neuro deficits... DERM:  No lesions noted; no rash etc...    MISC. Report  Procedure date:  06/26/2010  Findings:      DATA REVIEWED:   ~  [facility] records w/ H&P, Consult note, DCSummary, XRays, & Labs all reviewed w/ pt in detail today...  ~  OV w/ DrHIngram 05/29/10...  ~  OV w/ DrHa 04/16/10...   Impression & Recommendations:  Problem # 1:  CVA (ICD-434.91) HOSP records reviewed w/ pt & her disch meds are the same x for Plavix changed to "POINT study drug" (Plavix vs Placebo) & followed by DrSethi...  The following medications were removed from the medication list:    Plavix 75 Mg Tabs (Clopidogrel bisulfate) .Marland Kitchen... Take 1 tab by mouth once daily... Her updated medication list for this problem includes:    Bufferin 325 Mg Tabs (Aspirin buf(cacarb-mgcarb-mgo)) .Marland Kitchen... Take 1 tab daily...  Problem # 2:  HYPERTENSION (ICD-401.9) Controlled>  same meds. Her updated medication list for this problem includes:    Atenolol-chlorthalidone 50-25 Mg Tabs (Atenolol-chlorthalidone) .Marland Kitchen... Take 1 tablet by mouth once a day    Lisinopril 20 Mg Tabs (Lisinopril) .Marland Kitchen... Take 1 tab by mouth  once daily...  Problem # 3:  HYPERCHOLESTEROLEMIA (ICD-272.0) Controlled>  same med + diet. Her updated medication list for this problem includes:    Simvastatin 20 Mg Tabs (Simvastatin) .Marland Kitchen... Take one tablet at bedtime  Problem # 4:  HYPOTHYROIDISM (ICD-244.9) Stable>  same med. Her updated medication list for this problem includes:    Levoxyl 50 Mcg Tabs (Levothyroxine sodium) .Marland Kitchen... Take one tablet on an empty stomach  Problem # 5:  GERD (ICD-530.81) GI is stable>  Prilosec  changed to PEPCID20 due to Plavix study but she only uses Prn anyway... The following medications were removed from the medication list:    Prilosec Otc 20 Mg Tbec (Omeprazole magnesium) .Marland Kitchen... Take 1 tab by mouth once daily (30 min before the first meal of the day) Her updated medication list for this problem includes:    Pepcid 20 Mg Tabs (Famotidine) .Marland Kitchen... Take 1 tab by mouth once daily...  Problem # 6:  ADENOCARCINOMA, LEFT BREAST (ICD-174.9) Followed by Lenis Noon & DrIngram>  stable on ARIMIDEX 1mg /d...  Problem # 7:  CEREBROVASCULAR DISEASE (ICD-437.9) Hosp studies reviewed>  sm vessel dis, atrophy, lacunar infarcts, mild to mod intracranial atherosclerotic changes, ?3mm left paraophthalmic art aneurysm...  Problem # 8:  DYSTHYMIA (ICD-300.4) Followed by Psychiatry on Luvox Rx...  Complete Medication List: 1)  Lumigan 0.03 % Soln (Bimatoprost) .Marland Kitchen.. 1 drop in each eye at night 2)  Flonase 50 Mcg/act Susp (Fluticasone propionate) .... Take 2 sprays in each nostril at bedtime.Marland KitchenMarland Kitchen 3)  Zyrtec Allergy 10 Mg Tabs (Cetirizine hcl) .... Take 1 tablet by mouth once a day 4)  Bufferin 325 Mg Tabs (Aspirin buf(cacarb-mgcarb-mgo)) .... Take 1 tab daily.Marland KitchenMarland Kitchen 5)  Study Drug  .... Take as directed by drsethi... 6)  Atenolol-chlorthalidone 50-25 Mg Tabs (Atenolol-chlorthalidone) .... Take 1 tablet by mouth once a day 7)  Lisinopril 20 Mg Tabs (Lisinopril) .... Take 1 tab by mouth once daily.Marland KitchenMarland Kitchen 8)  Potassium Chloride Crys Cr 20  Meq Tbcr (Potassium chloride crys cr) .... Take 1 tablet by mouth once a day 9)  Simvastatin 20 Mg Tabs (Simvastatin) .... Take one tablet at bedtime 10)  Fish Oil 1000 Mg Caps (Omega-3 fatty acids) .... Take 1 tablet by mouth once a day 11)  Levoxyl 50 Mcg Tabs (Levothyroxine sodium) .... Take one tablet on an empty stomach 12)  Pepcid 20 Mg Tabs (Famotidine) .... Take 1 tab by mouth once daily... 13)  Nortriptyline Hcl 10 Mg Caps (Nortriptyline hcl) .... Take 3 tablets at bedtime 14)  Fluvoxamine Maleate 100 Mg Tabs (Fluvoxamine maleate) .... Take 1 1/2 tablet at bedtime 15)  Tylenol Arthritis Pain 650 Mg Tbcr (Acetaminophen) .... Take 2 tablets in the morning and take 2 tablets at night 16)  Womens Multivitamin Plus Tabs (Multiple vitamins-minerals) .... Take 1 tab daily... 17)  Anastrozole 1 Mg Tabs (Anastrozole) .... Take one tablet by mouth every am  Patient Instructions: 1)  Today we updated your med list- see below.... 2)  Continue your current meds the same & be sure to follow up w/ DrSethi regarding your Study drug.Marland KitchenMarland Kitchen 3)  We reviewed your hospital studies and lab data... 4)  Let's plan a follow up visit in 6 months.Marland KitchenMarland Kitchen

## 2010-12-25 NOTE — Assessment & Plan Note (Signed)
Summary: 6 months/apc   CC:  6 month ROV & review of mult medical problems....  History of Present Illness: 77 y/o WF here for a follow up visit... she has mult med problems as noted below... she is HOH and had a cochlear implant yrs ago... she is 77 y/o and rather frail & lives w/ her daughter & her husb...   ~  February 20, 2009:  she has been doing reasonably well over the last 83mo... notes some GERD symptoms intermittently and we discussed trial of Prilosec OTC daily for 1 month to see if her symptoms abate... also c/o pain on ball of left foot when walking- exam shows prob plantar wart in this area and we discussed refer to Podiatry...   ~  August 21, 2009:  DrHollander sent her to a retina specialist & she saw DrSanders 5/10 w/ macular edema & hemorrhage- s/p laser Rx (& repeated last week)... otherw stable without other complaints or concerns...   ~  February 19, 2010:  after she was here lastshe had an abn mammogram and subseq surg by [facility]-  left total mastectomy & sentinel node bx= 2.2cm invasive ductal carcinoma & 1/3 nodes pos for micromets... oncology per [facility] on aromatase inhibitor ARIMIDEX 1mg /d... last seen 01/17/10 f/u & notereviewed... she continues to see DrSanders for Ophthalmology w/ series of AVASTIN shots that seem to be helping according to the pt....  BP controlled on meds, due for CXR & fasting labs... OK TDAP today.    Current Problem List:  GLAUCOMA (ICD-365.9) - followed by DrHollander on gtts...  ~  5/10: sent to retina specialist & she saw DrSanders 5/10 w/ macular edema & hemorrhage- s/p laser Rx x2...  ~  subseq started on series of AVASTIN shots in the eyes that seem to be helping.  ALLERGIC RHINITIS (ICD-477.9) - on OTC Zyretek, FLONASE and ASTELIN prn...   BRONCHITIS, RECURRENT (ICD-491.9) - she is an ex-smoker... she had hx of LLL pneumonia in 2000... she denies cough, sputum, change in SOB, etc...  ~  CXR 3/11 showed RML scarring, otherw clear lungs, DJD  sp, left mastectomy...  HYPERTENSION (ICD-401.9) - on ATENOLOL/Hct 50-25 daily + K20/d & LISINOPRIL 20mg /d...  BP=124/70 today and similar on home checks... she denies CP, palpit, change in SOB...   ~  labs 3/10 showed K= 3.3, BUN= 17, Creat= 1.0... rec> increased the KCl to .  ~  labs 9/10 showed K= 3.9, BUN= 17, Creat= 0.9... rec> OK to keep K at 1/d.  HYPERCHOLESTEROLEMIA (ICD-272.0) - on SIMVASTATIN 20mg /d + Fish Oil daily...  ~  FLP 4/08 showed TChol 178, TG 171, HDL 71, LDL 73  ~  FLP 4/09 showed TChol 222, TG 112, HDL 109, LDL 95... continue same- GREAT HDL!!!  ~  FLP 3/10 showed TChol 179, TG 176, HDL 81, LDL 63... continue same.  ~  FLP 9/10 showed TChol 182, TG 174, HDL 77, LDL 70  ~  FLP 3/11 showed TChol 199, TG 139, HDL 88, LDL 84  HYPOTHYROIDISM (ICD-244.9) - on SYNTHROID 42mcg/d...  ~  labs 4/08 showed TSH = 0.33  ~  labs 4/09 showed TSH= 1.33  ~  labs 3/10 showed TSH= 1.42  ~  labs 3/11 showed TSH= 1.59  GERD (ICD-530.81) - she has noted some GERD symptoms: rec> Prilosec OTC daily but she uses Prn. DIVERTICULOSIS OF COLON (ICD-562.10) -   ~  last colonoscopy 11/06 by DrBrodie was normal, no change from 2000... IRRITABLE BOWEL SYNDROME (ICD-564.1)  Hx of URINARY INCONTINENCE (ICD-788.30) - she saw DrPeterson and had urodynamic studies in 2008... she wears pads.  DEGENERATIVE JOINT DISEASE, GENERALIZED (ICD-715.00)  CVA (ICD-434.91) - on ASA 325mg /d and PLAVIX 75mg /d... she was seen by [facility] in [facility] w/ studies showing bilat sm vessel ischemic strokes... HEADACHE (ICD-784.0) Hx of PERIPHERAL NEUROPATHY (ICD-356.9) - takes NORTRIPTYLINE 10mg - 3tabsQhs (per Psyche)... c/o her feet bothering her at night...   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 incr stress- 56y/o son w/ MI...  ANEMIA (ICD-285.9)  ~  abs 4/09 showed Hg= 13.7  ~  labs 3/11 showed Hg= 12.6  Health Maintenance - GYN =  DrMcPail on Estradiol 1mg /d... Mammograms at [facility] (Mammogram 10/10 w/ left breast mass- referred to [facility] & Bx pos for infiltrating ductal cancer)...  BMD here 9/10 showed TScores +5.3 in spine & -0.2 in [facility]... Vit D level 4/09 was 37 & Rx w/ OTC 1000 u daily... she get yearly Flu shots;  had Pneumovax 52yrs ago;  OK TDAP today (3/11).   Allergies (verified): No Known Drug Allergies  Past History:  Past Medical History:  GLAUCOMA (ICD-365.9) ALLERGIC RHINITIS (ICD-477.9) BRONCHITIS, RECURRENT (ICD-491.9) HYPERTENSION (ICD-401.9) HYPERCHOLESTEROLEMIA (ICD-272.0) HYPOTHYROIDISM (ICD-244.9) GERD (ICD-530.81) DIVERTICULOSIS OF COLON (ICD-562.10) IRRITABLE BOWEL SYNDROME (ICD-564.1) Hx of URINARY INCONTINENCE (ICD-788.30) DEGENERATIVE JOINT DISEASE, GENERALIZED (ICD-715.00) CVA (ICD-434.91) HEADACHE (ICD-784.0) Hx of PERIPHERAL NEUROPATHY (ICD-356.9) DYSTHYMIA (ICD-300.4) ANEMIA (ICD-285.9)  Past Surgical History: S/P nissen fundoplication at [facility] S/P lumbar laminectomy S/P pubovaginal sling surgery for incontinence S/P left mastectomy & sentinel node bx 10/10 DrHIngram  Family History: Reviewed history and no changes required. pt had left breast removed and 3 lymph nodes in nov 2010 due to cancer  Social History: Reviewed history and no changes required.  Review of Systems      See HPI       The patient complains of dyspnea on exertion and difficulty walking.  The patient denies anorexia, fever, weight loss, weight gain, vision loss, decreased hearing, hoarseness, chest pain, syncope, peripheral edema, prolonged cough, headaches, hemoptysis, abdominal pain, melena, hematochezia, severe indigestion/heartburn, hematuria, incontinence, muscle weakness, suspicious skin lesions, transient blindness, depression, unusual weight change, abnormal bleeding, enlarged lymph nodes, and angioedema.    Vital Signs:  Patient profile:   77 year old female Height:      58  inches Weight:      110.50 pounds BMI:     23.18 O2 Sat:      97 % on Room air Temp:     97.0 degrees F oral Pulse rate:   62 / minute BP sitting:   124 / 70  (right arm) Cuff size:   regular  Vitals Entered By: Randell Loop CMA (February 19, 2010 9:20 AM)  O2 Sat at Rest %:  97 O2 Flow:  Room air CC: 6 month ROV & review of mult medical problems... Is Patient Diabetic? No Pain Assessment Patient in pain? no      Comments meds updated today   Physical Exam  Additional Exam:  WD, Thin, 77 y/o WF in NAD... she is chr ill appearing...  GENERAL:  Alert & oriented; pleasant & cooperative... HEENT:  [place]/AT, EACs-clear, TMs-wnl, Cochlear implant, NOSE-clear, THROAT-clear & wnl. NECK:  Supple w/ fairROM; no JVD; normal carotid impulses w/o bruits; no thyromegaly or nodules palpated; no lymphadenopathy. CHEST:  s/p left mastectomy, clear lungs w/o wheezing, rales, or rhonchi... HEART:  Regular Rhythm;  g 1/6 SEM without rubs or  gallops... ABDOMEN:  Soft & nontender; normal bowel sounds; no organomegaly or masses detected. EXT: without deformities, mod arthritic changes; no varicose veins/ +venous insuffic/ no edema.  NEURO:  CN's intact;  no focal neuro deficits... DERM:  No lesions noted; no rash etc...    MISC. Report  Procedure date:  02/19/2010  Findings:      Today pt had f/u CXR & FASTING blood work >> reviewed & pt notified by phone tree...  SN    Impression & Recommendations:  Problem # 1:  ADENOCARCINOMA, LEFT BREAST (ICD-174.9) Dx on routine mammogram 10/10, w/ surg DrIngram, and oncology from [facility]... On Arimedex 1mg /d now... Orders: T-2 View CXR (71020TC) TLB-Lipid Panel (80061-LIPID) TLB-BMP (Basic Metabolic Panel-BMET) (80048-METABOL) TLB-Hepatic/Liver Function Pnl (80076-HEPATIC) TLB-CBC Platelet - w/Differential (85025-CBCD) TLB-TSH (Thyroid Stimulating Hormone) (84443-TSH)  Problem # 2:  BRONCHITIS, RECURRENT (ICD-491.9) No recent problems...  Problem  # 3:  HYPERTENSION (ICD-401.9) Controlled-  same meds. Her updated medication list for this problem includes:    Atenolol-chlorthalidone 50-25 Mg Tabs (Atenolol-chlorthalidone) .Marland Kitchen... Take 1 tablet by mouth once a day    Lisinopril 20 Mg Tabs (Lisinopril) .Marland Kitchen... Take 1 tab by mouth once daily...  Problem # 4:  HYPERCHOLESTEROLEMIA (ICD-272.0) Controlled on med + diet... Her updated medication list for this problem includes:    Simvastatin 20 Mg Tabs (Simvastatin) .Marland Kitchen... Take one tablet at bedtime  Problem # 5:  HYPOTHYROIDISM (ICD-244.9) Stable on the Levothyroid... Her updated medication list for this problem includes:    Levoxyl 50 Mcg Tabs (Levothyroxine sodium) .Marland Kitchen... Take one tablet on an empty stomach  Problem # 6:  DIVERTICULOSIS OF COLON (ICD-562.10) GI is stable...  Problem # 7:  DEGENERATIVE JOINT DISEASE, GENERALIZED (ICD-715.00) Stable-  she exercises some & uses Tylenol Prn... Her updated medication list for this problem includes:    Bufferin 325 Mg Tabs (Aspirin buf(cacarb-mgcarb-mgo)) .Marland Kitchen... Take 1 tab daily...    Tylenol Arthritis Pain 650 Mg Tbcr (Acetaminophen) .Marland Kitchen... Take 2 tablets in the morning and take 2 tablets at night  Problem # 8:  DYSTHYMIA (ICD-300.4) Continue Rx from DrPPittman...  Complete Medication List: 1)  Lumigan 0.03 % Soln (Bimatoprost) .Marland Kitchen.. 1 drop in each eye at night 2)  Flonase 50 Mcg/act Susp (Fluticasone propionate) .... Take 2 sprays in each nostril at bedtime.Marland KitchenMarland Kitchen 3)  Zyrtec Allergy 10 Mg Tabs (Cetirizine hcl) .... Take 1 tablet by mouth once a day 4)  Bufferin 325 Mg Tabs (Aspirin buf(cacarb-mgcarb-mgo)) .... Take 1 tab daily.Marland KitchenMarland Kitchen 5)  Plavix 75 Mg Tabs (Clopidogrel bisulfate) .... Take 1 tab by mouth once daily.Marland KitchenMarland Kitchen 6)  Atenolol-chlorthalidone 50-25 Mg Tabs (Atenolol-chlorthalidone) .... Take 1 tablet by mouth once a day 7)  Lisinopril 20 Mg Tabs (Lisinopril) .... Take 1 tab by mouth once daily.Marland KitchenMarland Kitchen 8)  Potassium Chloride Crys Cr 20 Meq Tbcr  (Potassium chloride crys cr) .... Take 1 tablet by mouth once a day 9)  Simvastatin 20 Mg Tabs (Simvastatin) .... Take one tablet at bedtime 10)  Fish Oil 1000 Mg Caps (Omega-3 fatty acids) .... Take 1 tablet by mouth once a day 11)  Levoxyl 50 Mcg Tabs (Levothyroxine sodium) .... Take one tablet on an empty stomach 12)  Prilosec Otc 20 Mg Tbec (Omeprazole magnesium) .... Take 1 tab by mouth once daily (30 min before the first meal of the day) 13)  Nortriptyline Hcl 10 Mg Caps (Nortriptyline hcl) .... Take 3 tablets at bedtime 14)  Fluvoxamine Maleate 100 Mg Tabs (Fluvoxamine maleate) .... Take 1 1/2 tablet  at bedtime 15)  Tylenol Arthritis Pain 650 Mg Tbcr (Acetaminophen) .... Take 2 tablets in the morning and take 2 tablets at night 16)  Womens Multivitamin Plus Tabs (Multiple vitamins-minerals) .... Take 1 tab daily... 17)  Avastin 100 Mg/30ml Soln (Bevacizumab) .... 25 mg per syringe per dr. Allyne Gee 18)  Anastrozole 1 Mg Tabs (Anastrozole) .... Take one tablet by mouth every am  Other Orders: Tdap => 64yrs IM (09811) Admin 1st Vaccine (91478)  Patient Instructions: 1)  Today we updated your med list- see below.... 2)  Continue your current medications the same... 3)  Today we did your follow up CXR & FASTING blood work... please call the "phone tree" in a few days for your lab results.Marland KitchenMarland Kitchen 4)  We also gave you the combination TETANUS shot called the TDAP (thisshould be good for 70yrs)... 5)  Call for any problems.Marland KitchenMarland Kitchen 6)  Please schedule a follow-up appointment in 6 months.   Immunizations Administered:  Tetanus Vaccine:    Vaccine Type: Tdap    Site: right deltoid    Mfr: boostrix    Dose: 0.5 ml    Route: IM    Given by: Randell Loop CMA    Exp. Date: 01/18/2012    Lot #: [id number]    VIS given: 10/11/07 version given February 19, 2010.

## 2010-12-25 NOTE — Letter (Signed)
Summary: [facility]   Imported By: Sherian Rein 06/25/2010 14:21:30  _____________________________________________________________________  External Attachment:    Type:   Image     Comment:   External Document

## 2010-12-31 NOTE — Progress Notes (Signed)
Summary: Blood in stools-tranfer of care to Dr.Chauna Osoria?   Phone Note From Other Clinic   Caller: Almyra Free [phone] at Dr Jodelle Green office Call For: Dr Marina Goodell Reason for Call: Schedule Patient Appt Summary of Call: Would like pt seen sooner than first available on 01-29-2011 for blood in stools. Asked to see Dr Juanda Chance but when I puuled up the old system I saw  Colon on 01-02-1999 & office visit on 12-26-1998 by Dr Marina Goodell. PA appt would be ok.  Initial call taken by: Leanor Kail [facility],  December 25, 2010 12:21 PM  Follow-up for Phone Call        Message left for [facility] to call back.  Teryl Lucy RN  December 25, 2010 2:21 PM Chart received and pt. contacted.She would like to transfer her care to Dr.Eulala Newcombe.Says she was not unhappy with Dr.Perry's care she just prefers a female G.I. Dr.Is transfer o.k.if Dr.Eugen Jeansonne will accept her. Follow-up by: Teryl Lucy RN,  December 25, 2010 3:24 PM  Additional Follow-up for Phone Call Additional follow up Details #1::        Sure, no problem. Hilarie Fredrickson MD  December 25, 2010 5:05 PM     Additional Follow-up for Phone Call Additional follow up Details #2::    Dr. Juanda Chance will  you accept this pt.? She was a former Dr.Perry pt. but prefers a female Dr. Atlee Abide is o.k. with Dr.Perry.Please advise. Follow-up by: Teryl Lucy RN,  December 26, 2010 8:08 AM  Additional Follow-up for Phone Call Additional follow up Details #3:: Details for Additional Follow-up Action Taken: Please scan pt's GI record to EMR for my review. OK to transfer .She is on Aggrenox for TIA. Additional Follow-up by: Hart Carwin MD,  December 26, 2010 8:27 AM   Appended Document: Blood in stools-tranfer of care to Dr.Jaylyne Breese? Pt. ntfd. and given appt. with Dr.Manal Kreutzer for 01/12/11.

## 2011-01-01 ENCOUNTER — Other Ambulatory Visit: Payer: Medicare Other

## 2011-01-01 ENCOUNTER — Encounter (INDEPENDENT_AMBULATORY_CARE_PROVIDER_SITE_OTHER): Payer: Self-pay | Admitting: *Deleted

## 2011-01-01 ENCOUNTER — Other Ambulatory Visit: Payer: Self-pay | Admitting: Pulmonary Disease

## 2011-01-01 DIAGNOSIS — Z1289 Encounter for screening for malignant neoplasm of other sites: Secondary | ICD-10-CM

## 2011-01-01 LAB — HEMOCCULT SLIDES (X 3 CARDS)
Fecal Occult Blood: NEGATIVE
OCCULT 2: NEGATIVE
OCCULT 5: NEGATIVE

## 2011-01-06 ENCOUNTER — Telehealth (INDEPENDENT_AMBULATORY_CARE_PROVIDER_SITE_OTHER): Payer: Self-pay | Admitting: *Deleted

## 2011-01-12 ENCOUNTER — Ambulatory Visit (INDEPENDENT_AMBULATORY_CARE_PROVIDER_SITE_OTHER): Payer: Medicare Other | Admitting: Internal Medicine

## 2011-01-12 ENCOUNTER — Encounter: Payer: Self-pay | Admitting: Internal Medicine

## 2011-01-12 DIAGNOSIS — R195 Other fecal abnormalities: Secondary | ICD-10-CM

## 2011-01-14 NOTE — Discharge Summary (Signed)
Summary: TIA    NAME:  Cordova, Alison                  ACCOUNT NO.:  [id number]      MEDICAL RECORD NO.:  [id number]          PATIENT TYPE:  INP      LOCATION:  3739                         FACILITY:  [facility]      PHYSICIAN:  Conley Canal, MD      DATE OF BIRTH:  07-19-1926      DATE OF ADMISSION:  06/08/2010   DATE OF DISCHARGE:  06/10/2010                                  DISCHARGE SUMMARY      PRIMARY CARE PHYSICIAN:  Dr. Alroy Dust from [facility].      PSYCHIATRIST:  Dr. Jamas Lav.      OPHTHALMOLOGIST:  Dr. Nelle Don.      ONCOLOGIST:  Dr. Conley Canal PHYSICIANS:  Dr. Delia Heady, Neurology.      DISCHARGE DIAGNOSES:   1. Transient ischemic attack.  The patient participating in POINT       trial.   2. 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   3. Hypertension.   4. Hyperlipidemia.   5. Hypothyroidism.   6. Gastroesophageal reflux disease.   7. Irritable bowel syndrome.   8. History of urinary incontinence.   9. Degenerative joint disease.   10.History of CVA in 1997.   11.Peripheral neuropathy.   12.Dysthymia, depression.      DISCHARGE MEDICATIONS:   1. Famotidine 20 mg daily for cerebral stroke.   2. Plavix once daily.   3. Anastrozole 1 mg daily.   4. Atenolol/chlorthalidone combination 50/25 mg daily.   5. Cetirizine OTC 1 tablet daily.   6. Ecotrin 325 mg daily.   7. Fluvoxamine 150 mg daily.   8. Synthroid 50 mcg daily.   9. Lisinopril 20 mg daily.   10.Lumigan eyedrops in both eyes q.h.s.   11.Multivitamins 1 tablet daily.   12.Nortriptyline 30 mg q.h.s.   13.KCl 20 mEq daily.   14.Zocor 20 mg q.h.s.   15.Tobramycin ophthalmic eyedrops daily.   16.Tylenol OTC 1 tablet daily.   17.MegaRed 300 mg OTC 1 tablet daily.      PROCEDURES PERFORMED:   1. MRI, MRA of the brain without contrast on July 18 showed no       significant flow-reducing lesion observed.  There was question of 3-       mm  aneurysm, right para-ophthalmic internal carotid artery.   2. Chest x-ray on July 17 showed no acute cardiopulmonary disease.       Also showed a 1-cm nodule at right lung base.      1. Carotid duplex on July 18 showed EF 55% to 60%, mild aortic and       mitral regurgitation.   2. A 2-D echocardiogram on July 18, result pending at time of       discharge.      HOSPITAL COURSE:  [name] is an 77 year old female with multiple   comorbidities admitted with right facial numbness and tingling with   suspicion for TIA.  The  patient was seen by Neurology in the emergency   room and was enrolled for POINT trial.  She had workup for possible CVA   with MRI, 2-D echocardiogram, carotid duplex as reported above.  Her   symptoms improved on their own.  Her labs were unremarkable, including   normal CBC.  Electrolytes significant for sodium 140, potassium 4, BUN   24, creatinine 1.22, calcium 9.4.  Hemoglobin A1c 5.6.  TSH, sed rate,   vitamin B12 level, cardiac enzymes negative.  She is being discharged to   follow with Neurology in 2 months.  She will call Dr. Marlis Edelson office for   her appointment.      Time spent for discharge preparation less than 30 minutes.               Conley Canal, MD            SR/MEDQ  D:  06/10/2010  T:  06/10/2010  Job:  [id number]      Electronically Signed by Conley Canal  on 06/15/2010 10:08:52 PM

## 2011-01-14 NOTE — Procedures (Signed)
Summary: 24 Hour PH Probe  24 Hour PH Probe   Imported By: Lamona Curl CMA (AAMA) 01/08/2011 10:57:24  _____________________________________________________________________  External Attachment:    Type:   Image     Comment:   External Document

## 2011-01-14 NOTE — Procedures (Signed)
Summary: Office Note  Office Note   Imported By: Lamona Curl CMA (AAMA) 01/06/2011 15:17:18  _____________________________________________________________________  External Attachment:    Type:   Image     Comment:   External Document

## 2011-01-14 NOTE — Progress Notes (Signed)
Summary: clarification on prescription  Phone Note From Pharmacy   Caller: megan with  Caller: megan with [facility] Call For: nadel  Summary of Call: Megan with [employer] called regarding Ms. Patella she needs clarification on the prescription Levoxyl. She can be reached at [phone] and reference number [id number].  Initial call taken by: Vedia Coffer,  January 06, 2011 1:20 PM  Follow-up for Phone Call        Spoke with Aundra Millet and advised levoxyl should be taken 1 once daily on empty stomach.   Follow-up by: Vernie Murders,  January 06, 2011 2:40 PM
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
## 2011-01-14 NOTE — Procedures (Signed)
Summary: COLON (Dr Marina Goodell)  COLON (Dr Marina Goodell)   Imported By: Lamona Curl CMA (AAMA) 01/06/2011 15:16:52  _____________________________________________________________________  External Attachment:    Type:   Image     Comment:   External Document

## 2011-01-14 NOTE — Assessment & Plan Note (Signed)
Summary: 6 months   Vital Signs:  Patient profile:   76 year old female Height:      58 inches Weight:      111.50 pounds O2 Sat:      99 % on Room air Temp:     98.0 degrees F oral Pulse rate:   91 / minute BP sitting:   142 / 70  (right arm) Cuff size:   regular  Vitals Entered By: Randell Loop CMA (December 25, 2010 10:19 AM)  O2 Sat at Rest %:  99 O2 Flow:  Room air CC: 6 month ROV & review of mult medical problems... Is Patient Diabetic? No Pain Assessment Patient in pain? no      Comments meds updated today with pt   CC:  6 month ROV & review of mult medical problems....  History of Present Illness: 77 y/o WF here for a follow up visit... she has mult med problems as noted below... she is HOH and had a cochlear implant yrs ago... she is 77 y/o and rather frail (lives w/ her daughter & her husb)...   ~  February 19, 2010:  after she was here last she had an abn mammogram and surg 11/10 by [facility]-  left total mastectomy & sentinel node bx= 2.2cm invasive ductal carcinoma & 1/3 nodes pos for micromets... oncology per [facility] on aromatase inhibitor ARIMIDEX 1mg /d... last seen 01/17/10 f/u & note reviewed... she continues to see DrSanders for Ophthalmology w/ series of AVASTIN shots that seem to be helping according to the pt....  BP controlled on meds, due for CXR (NAD- RML scarring, left mastectomy, DJD sp) & fasting labs (all essent WNL- creat=1.4)... OK TDAP today.   ~  June 26, 2010:  Hosp 7/17-19 w/ TIA (right facial numbness & tingling which resolved spont)... MRI w/o acute change- atrophy, sm vessel dis, scat lacunes, CSpine dis;  MRA showed mild to mod atherosclerotic changes, 50% left vertebral stenosis, ?3mm aneurysmal dil right paraophthalmic art... ONLY MED CHANGE WAS PLAVIX SWITCHED TO "POINT STUDY" DRUG (Placebo vs Plavix)> followed by DrSethi... BP controlled on meds;  Chol looks good on Simva20;  Thyroid regulated w/ Levothy50;  Prilosec changed to PEPCID 20mg /d...      ~  December 25, 2010:    99mo ROV- doing satis overall> f/u labs are satis x FLP not at goal on Simva20 & reminded re: low chol/ low fat diet... she states stools were pos for blood per GYN & needs f/u DrDBrodie (stool cards here are neg)...    She tells me that DrSethi switched her from ASA + study drug to Aggrenox Rx but she is upset w/ the cost factor & will discuss alternatives w/ Neurology...    She had Oncology f/u [facility] 12/11> doing satis on Anasttozole1mg /d, no changes made... she also saw her surgeon, DrIngram 11/11 & similarly stable, doing well, no changes.   Current Problem List:  GLAUCOMA (ICD-365.9) - followed by DrHollander on gtts...  ~  5/10: sent to retina spec & saw DrSanders 5/10 w/ macular edema & hemorrhage- s/p laser Rx x2.  ~  subseq started on series of AVASTIN shots in the eyes that seem to be helping.  ALLERGIC RHINITIS (ICD-477.9) - on OTC Zyretek, FLONASE and ASTELIN prn...   BRONCHITIS, RECURRENT (ICD-491.9) - she is an ex-smoker... she had hx of LLL pneumonia in 2000... she denies cough, sputum, change in SOB, etc...  ~  CXR 3/11 showed RML scarring, otherw clear  lungs, DJD sp, left mastectomy...  HYPERTENSION (ICD-401.9) - on ATENOLOL/Hct 50-25 daily + K20/d & LISINOPRIL 20mg /d...  BP=142/70 today and similar on home checks... she denies CP, palpit, change in SOB...   CEREBROVASCULAR DISEASE (ICD-437.9) - Hosp 7/11 w/ TIA (right facial numbness & tingling which resolved spont)...   ~  MRI 7/11w/o acute change- atrophy, sm vessel dis, scat lacunes, CSpine dis;  MRA showed mild to mod atherosclerotic changes, 50% left vertebral stenosis, ?3mm aneurysmal dil right paraophthalmic art... followed by DrSethi> initially on study drug, then switched to AGGRENOX Bid.  HYPERCHOLESTEROLEMIA (ICD-272.0) - on SIMVASTATIN 20mg /d + Fish Oil daily...  ~  FLP 4/08 on Simva20 showed TChol 178, TG 171, HDL 71, LDL 73  ~  FLP 4/09 showed TChol 222, TG 112, HDL 109, LDL 95...  continue same- GREAT HDL!!!  ~  FLP 3/10 on Simva20 showed TChol 179, TG 176, HDL 81, LDL 63... continue same.  ~  FLP 9/10 on Simva 20 showed TChol 182, TG 174, HDL 77, LDL 70  ~  FLP 3/11 on Simva20 showed TChol 199, TG 139, HDL 88, LDL 84  ~  FLP 7/11 in hosp on Simva20 showed TChol 180, TG 186, HDL 60, LDL 83  ~  FLP 21/12 on Simva20 showed TChol 217, TG 163, HDL 68, LDL 128... reminded about diet + med.  HYPOTHYROIDISM (ICD-244.9) - on SYNTHROID 45mcg/d...  ~  labs 4/08 showed TSH = 0.33  ~  labs 4/09 showed TSH= 1.33  ~  labs 3/10 showed TSH= 1.42  ~  labs 3/11 showed TSH= 1.59  ~  labs 7/11 in hosp showed TSH= 1.63  ~  labs 2/12 showed TSH= 1.89  GERD (ICD-530.81) - she has noted some GERD symptoms: rec> Pepcid 20mg  OTC daily but she uses Prn. DIVERTICULOSIS OF COLON (ICD-562.10) -   ~  last colonoscopy 11/06 by DrBrodie was normal, no change from 2000... IRRITABLE BOWEL SYNDROME (ICD-564.1)  Hx of URINARY INCONTINENCE (ICD-788.30) - she saw DrPeterson and had urodynamic studies in 2008... she wears pads.  ADENOCARCINOMA, LEFT BREAST (ICD-174.9) - diagnosed w/ left breast adenocarcinoma 11/10 w/ total left mastectomy, sentinel node bx (micro mets in 1/3 nodes), & adjuvant hormonal therapy w/ ANASTROZOLE 1mg /d followed by Lenis Noon since 11/10.  DEGENERATIVE JOINT DISEASE, GENERALIZED (ICD-715.00)  CVA (ICD-434.91) - on ASA 325mg /d and then POINT Study drug (Placebo vs Plavix)> now on AGGRENOX Bid per neuro.  ~  she was seen by [facility] in [facility] w/ studies showing bilat sm vessel ischemic strokes...  ~  [facility] 7/11 w/ TIA ** SEE ABOVE **  HEADACHE (ICD-784.0) Hx of PERIPHERAL NEUROPATHY (ICD-356.9) - takes NORTRIPTYLINE 10mg - 3tabsQhs (per Psyche)... c/o her feet bothering her at night...   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 incr stress- 56y/o son w/ MI...  ANEMIA (ICD-285.9)  ~  abs 4/09 showed Hg= 13.7   ~  labs 3/11 showed Hg= 12.6  ~  labs 7/11 in hosp showed Hg= 12.6  ~  labs 2/12 showed Hg= 12.6  Health Maintenance - GYN = prev DrMcPail, but saw new GYN 2011 & told no further evals needed... Mammograms at [facility] (Mammogram 10/10 w/ left breast mass- referred to [facility] & Bx pos for infiltrating ductal cancer)...  BMD here 9/10 showed TScores +5.3 in spine & -0.2 in [facility]... Vit D level 4/09 was 37 & Rx w/ OTC 1000 u daily... she gets yearly Flu shots;  had  Pneumovax 23yrs ago;  given TDAP 3/11.   Preventive Screening-Counseling & Management  Alcohol-Tobacco     Smoking Status: quit     Year Quit: 1964  Allergies (verified): No Known Drug Allergies  Comments:  Nurse/Medical Assistant: The patient's medications and allergies were reviewed with the patient and were updated in the Medication and Allergy Lists.  Past History:  Past Medical History: GLAUCOMA (ICD-365.9) - hx macular edema & hemorrhage Rx by DrSanders. ALLERGIC RHINITIS (ICD-477.9) BRONCHITIS, RECURRENT (ICD-491.9) HYPERTENSION (ICD-401.9) CEREBROVASCULAR DISEASE (ICD-437.9) HYPERCHOLESTEROLEMIA (ICD-272.0) HYPOTHYROIDISM (ICD-244.9) GERD (ICD-530.81) DIVERTICULOSIS OF COLON (ICD-562.10) IRRITABLE BOWEL SYNDROME (ICD-564.1) Hx of URINARY INCONTINENCE (ICD-788.30) ADENOCARCINOMA, LEFT BREAST (ICD-174.9) DEGENERATIVE JOINT DISEASE, GENERALIZED (ICD-715.00) CVA (ICD-434.91) HEADACHE (ICD-784.0) Hx of PERIPHERAL NEUROPATHY (ICD-356.9) DYSTHYMIA (ICD-300.4) ANEMIA (ICD-285.9)  Past Surgical History: S/P nissen fundoplication at [facility] S/P lumbar laminectomy S/P pubovaginal sling surgery for incontinence S/P left mastectomy & sentinel node bx 10/10 DrHIngram  Family History: Reviewed history from 06/26/2010 and no changes required. pt had left breast removed and 3 lymph nodes in nov 2010 due to cancer.  Social History: Reviewed history and no changes required.  Review of Systems      See HPI        The patient complains of dyspnea on exertion.  The patient denies anorexia, fever, weight loss, weight gain, vision loss, decreased hearing, hoarseness, chest pain, syncope, peripheral edema, prolonged cough, headaches, hemoptysis, abdominal pain, melena, hematochezia, severe indigestion/heartburn, hematuria, incontinence, muscle weakness, suspicious skin lesions, transient blindness, difficulty walking, depression, unusual weight change, abnormal bleeding, enlarged lymph nodes, and angioedema.    Physical Exam  Additional Exam:  WD, Thin, 77 y/o WF in NAD... she is chr ill appearing...  GENERAL:  Alert & oriented; pleasant & cooperative... HEENT:  [place]/AT, EACs-clear, TMs-wnl, Cochlear implant, NOSE-clear, THROAT-clear & wnl. NECK:  Supple w/ fairROM; no JVD; normal carotid impulses w/o bruits; no thyromegaly or nodules palpated; no lymphadenopathy. CHEST:  s/p left mastectomy, clear lungs w/o wheezing, rales, or rhonchi... HEART:  Regular Rhythm;  g 1/6 SEM without rubs or gallops... ABDOMEN:  Soft & nontender; normal bowel sounds; no organomegaly or masses detected. EXT: without deformities, mod arthritic changes; no varicose veins/ +venous insuffic/ no edema.  NEURO:  CN's intact;  no asymmetry;  no focal neuro deficits... DERM:  No lesions noted; no rash etc...    Impression & Recommendations:  Problem # 1:  GLAUCOMA (ICD-365.9) Visual problems & macular degen w/ shots per DrSanders...  Problem # 2:  HYPERTENSION (ICD-401.9) Control is fair> reminded to take meds regularly & monitor BP at home... Her updated medication list for this problem includes:    Atenolol-chlorthalidone 50-25 Mg Tabs (Atenolol-chlorthalidone) .Marland Kitchen... Take 1 tablet by mouth once a day    Lisinopril 20 Mg Tabs (Lisinopril) .Marland Kitchen... Take 1 tab by mouth once daily...  Orders: TLB-BMP (Basic Metabolic Panel-BMET) (80048-METABOL) TLB-Hepatic/Liver Function Pnl (80076-HEPATIC) TLB-CBC Platelet - w/Differential  (85025-CBCD) TLB-Lipid Panel (80061-LIPID) TLB-TSH (Thyroid Stimulating Hormone) (84443-TSH)  Problem # 3:  CVA (ICD-434.91) She had TIA that cleared>  still followed by drSethi & she reports ch to [facility]... asked to request notes sent to [place] for our records... The following medications were removed from the medication list:    Bufferin 325 Mg Tabs (Aspirin buf(cacarb-mgcarb-mgo)) .Marland Kitchen... Take 1 tab daily... Her updated medication list for this problem includes:    Aggrenox 25-200 Mg Xr12h-cap (Aspirin-dipyridamole) .Marland Kitchen... Take one tablet by mouth two times a day  Problem # 4:  HYPERCHOLESTEROLEMIA (ICD-272.0) FLP looks worse &  she doesn't want to incr the dose of simva... therefore reminded to take the simva20 daily + diet/ exercise... Her updated medication list for this problem includes:    Simvastatin 20 Mg Tabs (Simvastatin) .Marland Kitchen... Take one tablet at bedtime  Problem # 5:  HYPOTHYROIDISM (ICD-244.9) Stable, same med... Her updated medication list for this problem includes:    Levoxyl 50 Mcg Tabs (Levothyroxine sodium) .Marland Kitchen... Take one tablet on an empty stomach  Problem # 6:  GI >>> She reports +hidden blood per GYN... we will recheck stool cards and refer to DrDBrodie for further eval...  Problem # 7:  ADENOCARCINOMA, LEFT BREAST (ICD-174.9) Followed by Lenis Noon & DrIngram... continue Anastrozole...  Problem # 8:  OTHER MEEDICAL PROBLEMS AS NOTED>>>  Complete Medication List: 1)  Lumigan 0.03 % Soln (Bimatoprost) .Marland Kitchen.. 1 drop in each eye at night 2)  Flonase 50 Mcg/act Susp (Fluticasone propionate) .... Take 2 sprays in each nostril at bedtime.Marland KitchenMarland Kitchen 3)  Zyrtec Allergy 10 Mg Tabs (Cetirizine hcl) .... Take 1 tablet by mouth once a day 4)  Aggrenox 25-200 Mg Xr12h-cap (Aspirin-dipyridamole) .... Take one tablet by mouth two times a day 5)  Atenolol-chlorthalidone 50-25 Mg Tabs (Atenolol-chlorthalidone) .... Take 1 tablet by mouth once a day 6)  Lisinopril 20 Mg Tabs (Lisinopril) .... Take 1  tab by mouth once daily.Marland KitchenMarland Kitchen 7)  Potassium Chloride Crys Cr 20 Meq Tbcr (Potassium chloride crys cr) .... Take 1 tablet by mouth once a day 8)  Simvastatin 20 Mg Tabs (Simvastatin) .... Take one tablet at bedtime 9)  Fish Oil 1000 Mg Caps (Omega-3 fatty acids) .... Take 1 tablet by mouth once a day 10)  Levoxyl 50 Mcg Tabs (Levothyroxine sodium) .... Take one tablet on an empty stomach 11)  Pepcid 20 Mg Tabs (Famotidine) .... Take 1 tab by mouth once daily... 12)  Nortriptyline Hcl 10 Mg Caps (Nortriptyline hcl) .... Take 3 tablets at bedtime 13)  Fluvoxamine Maleate 100 Mg Tabs (Fluvoxamine maleate) .... Take 1 1/2 tablet at bedtime 14)  Tylenol Arthritis Pain 650 Mg Tbcr (Acetaminophen) .... Take 2 tablets in the morning and take 2 tablets at night 15)  Womens Multivitamin Plus Tabs (Multiple vitamins-minerals) .... Take 1 tab daily... 16)  Anastrozole 1 Mg Tabs (Anastrozole) .... Take one tablet by mouth every am 17)  Fluocinonide 0.05 % Crea (Fluocinonide) .... Apply as directed...  Other Orders: Gastroenterology Referral (GI)  Patient Instructions: 1)  Today we updated your med list- see below.... 2)  We refilled your meds for 2012 per request... 3)  Today we did your follow up FASTING lab work... 4)  please call the "phone tree" in a few days for your lab results.Marland KitchenMarland Kitchen 5)  Stay as active as poss... 6)  Please collect the "stool cards" & return them to [place] so that we may check for hidden blood... 7)  We will set up an appt w/ Dr. Lina Sar.Marland KitchenMarland Kitchen 8)  Call for any questions.Marland KitchenMarland Kitchen 9)  Please schedule a follow-up appointment in 6 months. Prescriptions: LEVOXYL 50 MCG  TABS (LEVOTHYROXINE SODIUM) take one tablet on an empty stomach  #90 x 4   Entered and Authorized by:   Michele Mcalpine MD   Signed by:   Michele Mcalpine MD on 12/25/2010   Method used:   Print then Give to Patient   RxID:   [id number] SIMVASTATIN 20 MG  TABS (SIMVASTATIN) take one tablet at bedtime  #90 x 4   Entered and  Authorized by:  Michele Mcalpine MD   Signed by:   Michele Mcalpine MD on 12/25/2010   Method used:   Print then Give to Patient   RxID:   [id number] POTASSIUM CHLORIDE CRYS CR 20 MEQ  TBCR (POTASSIUM CHLORIDE CRYS CR) Take 1 tablet by mouth once a day  #90 x 4   Entered and Authorized by:   Michele Mcalpine MD   Signed by:   Michele Mcalpine MD on 12/25/2010   Method used:   Print then Give to Patient   RxID:   [id number] LISINOPRIL 20 MG  TABS (LISINOPRIL) take 1 tab by mouth once daily...  #90 x 4   Entered and Authorized by:   Michele Mcalpine MD   Signed by:   Michele Mcalpine MD on 12/25/2010   Method used:   Print then Give to Patient   RxID:   [id number] ATENOLOL-CHLORTHALIDONE 50-25 MG  TABS (ATENOLOL-CHLORTHALIDONE) Take 1 tablet by mouth once a day  #90 x 4   Entered and Authorized by:   Michele Mcalpine MD   Signed by:   Michele Mcalpine MD on 12/25/2010   Method used:   Print then Give to Patient   RxID:   [id number] FLONASE 50 MCG/ACT SUSP (FLUTICASONE PROPIONATE) Take 2 sprays in each nostril at bedtime...  #1 x prn   Entered and Authorized by:   Michele Mcalpine MD   Signed by:   Michele Mcalpine MD on 12/25/2010   Method used:   Print then Give to Patient   RxID:   [id number] FLUOCINONIDE 0.05 % CREA (FLUOCINONIDE) apply as directed...  #1 tube x prn   Entered and Authorized by:   Michele Mcalpine MD   Signed by:   Michele Mcalpine MD on 12/25/2010   Method used:   Print then Mail to Patient   RxID:   [id number]    Orders Added: 1)  Est. Patient Level IV [76283] 2)  Gastroenterology Referral [GI] 3)  TLB-BMP (Basic Metabolic Panel-BMET) [80048-METABOL] 4)  TLB-Hepatic/Liver Function Pnl [80076-HEPATIC] 5)  TLB-CBC Platelet - w/Differential [85025-CBCD] 6)  TLB-Lipid Panel [80061-LIPID] 7)  TLB-TSH (Thyroid Stimulating Hormone) [15176-HYW]

## 2011-01-16 ENCOUNTER — Encounter: Payer: Self-pay | Admitting: Internal Medicine

## 2011-01-16 ENCOUNTER — Telehealth: Payer: Self-pay | Admitting: Internal Medicine

## 2011-01-20 NOTE — Progress Notes (Addendum)
Summary: Questions   Phone Note Call from Patient Call back at Home Phone [phone]   Caller: Patient Call For: Dr. Juanda Chance Reason for Call: Talk to Nurse Summary of Call: Pt has questions about her prep and when to stop her medication prior to procedure Initial call taken by: [place] Johnson,  January 16, 2011 11:00 AM  Follow-up for Phone Call        Patient calling to report she is on Aggrenox and will be on it until the end of March then she will start Aspirin 325 mg daily. She is scheduled for an endo/colon on 02/19/11. She wants to know what she should do about taking the Aggrenox. Please, advise. Follow-up by: Jesse Fall RN,  January 16, 2011 11:12 AM  Additional Follow-up for Phone Call Additional follow up Details #1::        She  was intentionally scheduled for EGD/Colon after Aggrenox d/c'd,  so she will have to be off Aggrenox foe 5 days before EGD/Colon. Please check with her PCP if it is OK to D/C Aggrenox 1 week earlier or just reschedule both procedures for April what ever is easier for You, Additional Follow-up by: Hart Carwin MD,  January 16, 2011 12:02 PM    Additional Follow-up for Phone Call Additional follow up Details #2::    Spoke with patient and the MD that has her on the Aggrenox is Dr. Darcella Cheshire. Letter faxed re: anticoagulation therapy. Patient aware that we are checking with Dr. Pearlean Brownie to see if she can hold the Aggrenox. Flag sent to me to look for the answer from Dr. Pearlean Brownie. Follow-up by: Jesse Fall RN,  January 16, 2011 2:40 PM   Appended Document: Questions Spoke with patient. Dr. Pearlean Brownie has agreed to stopping the Aggrenox 5 days prior to procedure(02/14/11) Patient aware of this.

## 2011-01-20 NOTE — Assessment & Plan Note (Signed)
Summary: BLOOD IN STOOLS/YF    History of Present Illness Visit Type: Initial Consult Primary GI MD: Lina Sar MD Primary Provider: Alroy Dust, MD Requesting Provider: Alroy Dust, MD Chief Complaint: On a physical exam in November at pt's OBGYN, she has pos heme cards. Pt states she has noticed darker stools. Pt denies any change in bowels. Dr. Kriste Basque did Hemoccult slides that were all negative. History of Present Illness:   This is an 77 year old [place] female with Hemoccult positive stool on pelvic exam by her gynecologist. 6 Hemoccult cards given by Dr. Kriste Basque were negative. She denies any GI symptoms. Her last screening colonoscopy in 2000 was done because of Hemoccult-positive stool and it was a normal exam ti TI by Dr Marina Goodell. She denies any upper or lower GI symptoms. Her most recent hemoglobin and lab tests in January 2012 were normal with a hemoglobin of 12.6, hematocrit of 36.2 and MCV of 100.3. Patient denies taking alcohol. She has a history of CVA in 1997 and TIA in August 2011. She was on Plavix and subsequently on Aggrenox. Aggrenox will be discontinued in 30 days due to high cost  and she will take  aspirin 325 mg a day. There is a history of breast cancer and  is status post left mastectomy. She also had a Nissen fundoplication in the past.   GI Review of Systems    Reports belching and  dysphagia with solids.      Denies abdominal pain, acid reflux, bloating, chest pain, dysphagia with liquids, heartburn, loss of appetite, nausea, vomiting, vomiting blood, weight loss, and  weight gain.      Reports constipation, diarrhea, and  heme positive stool.     Denies anal fissure, black tarry stools, change in bowel habit, diverticulosis, fecal incontinence, hemorrhoids, irritable bowel syndrome, jaundice, light color stool, liver problems, rectal bleeding, and  rectal pain.    Current Medications (verified): 1)  Lumigan 0.03 %  Soln (Bimatoprost) .Marland Kitchen.. 1 Drop in Each Eye At Night 2)   Flonase 50 Mcg/act Susp (Fluticasone Propionate) .... Take 2 Sprays in Each Nostril At Bedtime.Marland KitchenMarland Kitchen 3)  Zyrtec Allergy 10 Mg  Tabs (Cetirizine Hcl) .... Take 1 Tablet By Mouth Once A Day 4)  Aggrenox 25-200 Mg Xr12h-Cap (Aspirin-Dipyridamole) .... Take One Tablet By Mouth Two Times A Day 5)  Atenolol-Chlorthalidone 50-25 Mg  Tabs (Atenolol-Chlorthalidone) .... Take 1 Tablet By Mouth Once A Day 6)  Lisinopril 20 Mg  Tabs (Lisinopril) .... Take 1 Tab By Mouth Once Daily.Marland KitchenMarland Kitchen 7)  Potassium Chloride Crys Cr 20 Meq  Tbcr (Potassium Chloride Crys Cr) .... Take 1 Tablet By Mouth Once A Day 8)  Simvastatin 20 Mg  Tabs (Simvastatin) .... Take One Tablet At Bedtime 9)  Fish Oil 1000 Mg  Caps (Omega-3 Fatty Acids) .... Take 1 Tablet By Mouth Once A Day 10)  Levoxyl 50 Mcg  Tabs (Levothyroxine Sodium) .... Take One Tablet On An Empty Stomach 11)  Pepcid 20 Mg Tabs (Famotidine) .... Take 1 Tab By Mouth Once Daily... 12)  Nortriptyline Hcl 10 Mg  Caps (Nortriptyline Hcl) .... Take 3 Tablets At Bedtime 13)  Fluvoxamine Maleate 100 Mg  Tabs (Fluvoxamine Maleate) .... Take 1 1/2 Tablet At Bedtime 14)  Tylenol Arthritis Pain 650 Mg  Tbcr (Acetaminophen) .... Take 2 Tablets in The Morning and Take 2 Tablets At Night 15)  Womens Multivitamin Plus  Tabs (Multiple Vitamins-Minerals) .... Take 1 Tab Daily... 16)  Anastrozole 1 Mg Tabs (Anastrozole) .... Take One  Tablet By Mouth Every Am 17)  Fluocinonide 0.05 % Crea (Fluocinonide) .... Apply As Directed...  Allergies (verified): No Known Drug Allergies  Past History:  Past Medical History: Reviewed history from 12/25/2010 and no changes required. GLAUCOMA (ICD-365.9) - hx macular edema & hemorrhage Rx by DrSanders. ALLERGIC RHINITIS (ICD-477.9) BRONCHITIS, RECURRENT (ICD-491.9) HYPERTENSION (ICD-401.9) CEREBROVASCULAR DISEASE (ICD-437.9) HYPERCHOLESTEROLEMIA (ICD-272.0) HYPOTHYROIDISM (ICD-244.9) GERD (ICD-530.81) DIVERTICULOSIS OF COLON (ICD-562.10) IRRITABLE  BOWEL SYNDROME (ICD-564.1) Hx of URINARY INCONTINENCE (ICD-788.30) ADENOCARCINOMA, LEFT BREAST (ICD-174.9) DEGENERATIVE JOINT DISEASE, GENERALIZED (ICD-715.00) CVA (ICD-434.91) HEADACHE (ICD-784.0) Hx of PERIPHERAL NEUROPATHY (ICD-356.9) DYSTHYMIA (ICD-300.4) ANEMIA (ICD-285.9)  Past Surgical History: Reviewed history from 01/06/2011 and no changes required. S/P [facility]issen fundoplication at [facility] S/P lumbar laminectomy S/P pubovaginal sling surgery for incontinence S/P left mastectomy & sentinel node bx 10/10 [facility] hysterectomy Tonsillectomy & Adnoidectomy  Family History: Reviewed history from 01/06/2011 and no changes required. pt had left breast removed and 3 lymph nodes in nov 2010 due to cancer. Family History of Heart Disease: Mother  Social History: Reviewed history from 01/06/2011 and no changes required. Widowed Patient is a former smoker. Alcohol Use - yes-occasional wine Illicit Drug Use - no  Review of Systems       The patient complains of allergy/sinus, arthritis/joint pain, change in vision, depression-new, fatigue, shortness of breath, sleeping problems, and urine leakage.  The patient denies anemia, anxiety-new, back pain, blood in urine, breast changes/lumps, confusion, cough, coughing up blood, fainting, fever, headaches-new, hearing problems, heart murmur, heart rhythm changes, itching, menstrual pain, muscle pains/cramps, night sweats, nosebleeds, pregnancy symptoms, skin rash, sore throat, swelling of feet/legs, swollen lymph glands, thirst - excessive , urination - excessive , urination changes/pain, vision changes, and voice change.         Pertinent positive and negative review of systems were noted in the above HPI. All other ROS was otherwise negative.   Vital Signs:  Patient profile:   77 year old female Height:      58 inches Weight:      111.50 pounds Pulse rate:   90 / minute Pulse rhythm:   regular BP sitting:   146 / 74  (right arm) Cuff  size:   regular  Vitals Entered By: Christie Nottingham CMA Duncan Dull) (January 12, 2011 2:53 PM)  Physical Exam  General:  Well developed, well nourished, no acute distress. Eyes:  PERRLA, no icterus. Mouth:  No deformity or lesions, dentition normal. Neck:  Supple; no masses or thyromegaly. Lungs:  Clear throughout to auscultation. Heart:  Regular rate and rhythm; no murmurs, rubs,  or bruits. Abdomen:  protuberant abdomen with tympany and normal bowel sounds. No tenderness. No palpable mass. No fluid wave. Liver edge at costal margin. Rectal:  normal perianal area. Normal rectal sphincter tone. Formed Hemoccult-negative stool. Extremities:  No clubbing, cyanosis, edema or deformities noted. Skin:  Intact without significant lesions or rashes. Psych:  Alert and cooperative. Normal mood and affect.   Impression & Recommendations:  Problem # 1:  ANEMIA (ICD-285.9) Patient's most recent hemoglobin was 12.6 and her hematocrit was 36.2 with an MCV of 100.3  Problem # 2:  DIVERTICULOSIS OF COLON (ICD-562.10) Patient had Hemoccult-positive stool on a her GYN  exam. However, it was not re produced on today's exam in the office. Her last colonoscopy was 12 years ago. We will proceed with a colonoscopy exam to rule out colon neoplasm. If negative, we will also proceed with an upper endoscopy. She has a history of Nissen fundoplication but has no  symptoms of reflux.  Problem # 3:  CEREBROVASCULAR DISEASE (ICD-437.9) on Aggrenox till end of March 2012,  Problem # 4:  ADENOCARCINOMA, LEFT BREAST (ICD-174.9) Assessment: Comment Only  Other Orders: Colon/Endo (Colon/Endo)  Patient Instructions: 1)  You have been scheduled for a colonoscopy for evaluation of heme positive stool. 2)  You have been scheduled for a possible upper endoscopy if colonoscopy is negative. We will schedule both procedures. Continue Citrucel 2 teaspoons daily. 3)  Procedures will be scheduled for late March 2012. She is  scheduled to Dr Kriste Basque. (patient will be off of Aggrenox by that time) 4)  Copy sent to : Alroy Dust, MD 5)  The medication list was reviewed and reconciled.  All changed / newly prescribed medications were explained.  A complete medication list was provided to the patient / caregiver. Prescriptions: MOVIPREP 100 GM  SOLR (PEG-KCL-NACL-NASULF-NA ASC-C) As per prep instructions.  #1 x 0   Entered by:   Lamona Curl CMA (AAMA)   Authorized by:   Hart Carwin MD   Signed by:   Lamona Curl CMA (AAMA) on 01/12/2011   Method used:   Electronically to        [facility] [phone]* (retail)       [address] [place], [place]  [address]       Ph: [phone]       Fax: [phone]   RxID:   [id number]

## 2011-01-20 NOTE — Letter (Signed)
Summary: Anticoagulation Modification Letter  [facility]  [address] [place], [place] [address]   Phone: [phone]  Fax: [phone]    January 16, 2011  Re:    Alison Cordova DOB:    1926/10/02 MRN:    [id number]    Dear Dr. Pearlean Brownie:  We have scheduled the above patient for an endoscopic procedure. Our records show that  he/she is on anticoagulation therapy. Please advise as to how long the patient may come off their therapy of  Aggrenox  prior to the scheduled procedure(s) on 02/19/2011.   Please fax back/or route the completed form to [facility] at [phone].  Thank you for your help with this matter.  Sincerely,  Jesse Fall RN   Physician Recommendation:    Other: Hold Aggrenox x 5 days prior to procedure_______     Appended Document: Anticoagulation Modification Letter Letter faxed to Dr. Marlis Edelson office

## 2011-01-20 NOTE — Letter (Signed)
Summary: [facility] [place] [facility] Instructions  [facility]  [address] [place], [place] [address]   Phone: [phone]  Fax: [phone]       Alison Cordova    [birth year]    MRN: [id number]        Procedure Day /Date: Thursday 02/19/11     Arrival Time: 2:30 pm     Procedure Time: 3:30 pm     Location of Procedure:                    _ x_  [facility] (4th Floor)  PREPARATION FOR COLONOSCOPY WITH MOVIPREP   Starting 5 days prior to your procedure 02/14/11 do not eat nuts, seeds, popcorn, corn, beans, peas,  salads, or any raw vegetables.  Do not take any fiber supplements (e.g. Metamucil, Citrucel, and Benefiber).  THE DAY BEFORE YOUR PROCEDURE         DATE: 02/18/11  DAY: Wednsday  1.  Drink clear liquids the entire day-NO SOLID FOOD  2.  Do not drink anything colored red or purple.  Avoid juices with pulp.  No orange juice.  3.  Drink at least 64 oz. (8 glasses) of fluid/clear liquids during the day to prevent dehydration and help the prep work efficiently.  CLEAR LIQUIDS INCLUDE: Water Jello Ice Popsicles Tea (sugar ok, no milk/cream) Powdered fruit flavored drinks Coffee (sugar ok, no milk/cream) Gatorade Juice: apple, Franko grape, Herendeen cranberry  Lemonade Clear bullion, consomm, broth Carbonated beverages (any kind) Strained chicken noodle soup Hard Candy                             4.  In the morning, mix first dose of MoviPrep solution:    Empty 1 Pouch A and 1 Pouch B into the disposable container    Add lukewarm drinking water to the top line of the container. Mix to dissolve    Refrigerate (mixed solution should be used within 24 hrs)  5.  Begin drinking the prep at 5:00 p.m. The MoviPrep container is divided by 4 marks.   Every 15 minutes drink the solution down to the next mark (approximately 8 oz) until the full liter is complete.   6.  Follow completed prep with 16 oz of clear liquid of your choice (Nothing red or purple).  Continue  to drink clear liquids until bedtime.  7.  Before going to bed, mix second dose of MoviPrep solution:    Empty 1 Pouch A and 1 Pouch B into the disposable container    Add lukewarm drinking water to the top line of the container. Mix to dissolve    Refrigerate  THE DAY OF YOUR PROCEDURE      DATE: 02/19/11 DAY: Thursday  Beginning at 10:30 a.m. (5 hours before procedure):         1. Every 15 minutes, drink the solution down to the next mark (approx 8 oz) until the full liter is complete.  2. Follow completed prep with 16 oz. of clear liquid of your choice.    3. You may drink clear liquids until 1:30 pm (2 HOURS BEFORE PROCEDURE).   MEDICATION INSTRUCTIONS  Unless otherwise instructed, you should take regular prescription medications with a small sip of water   as early as possible the morning of your procedure.  You may continue Aspirin per Dr Juanda Chance         OTHER INSTRUCTIONS  You  will need a responsible adult at least 75 years of age to accompany you and drive you home.   This person must remain in the waiting room during your procedure.  Wear loose fitting clothing that is easily removed.  Leave jewelry and other valuables at home.  However, you may wish to bring a book to read or  an iPod/MP3 player to listen to music as you wait for your procedure to start.  Remove all body piercing jewelry and leave at home.  Total time from sign-in until discharge is approximately 2-3 hours.  You should go home directly after your procedure and rest.  You can resume normal activities the  day after your procedure.  The day of your procedure you should not:   Drive   Make legal decisions   Operate machinery   Drink alcohol   Return to work  You will receive specific instructions about eating, activities and medications before you leave.    The above instructions have been reviewed and explained to me by   _______________________    I fully understand and can  verbalize these instructions _____________________________ Date _________

## 2011-01-29 ENCOUNTER — Ambulatory Visit: Payer: Medicare Other | Admitting: Internal Medicine

## 2011-01-29 NOTE — Letter (Signed)
Summary: Anticoagulation/[facility]  Anticoagulation/[facility]   Imported By: Sherian Rein 01/21/2011 14:59:32  _____________________________________________________________________  External Attachment:    Type:   Image     Comment:   External Document

## 2011-02-07 LAB — DIFFERENTIAL
Basophils Absolute: 0.1 10*3/uL (ref 0.0–0.1)
Basophils Relative: 1 % (ref 0–1)
Eosinophils Absolute: 0.2 10*3/uL (ref 0.0–0.7)
Eosinophils Relative: 3 % (ref 0–5)
Monocytes Absolute: 1.1 10*3/uL — ABNORMAL HIGH (ref 0.1–1.0)

## 2011-02-07 LAB — COMPREHENSIVE METABOLIC PANEL
AST: 17 U/L (ref 0–37)
Albumin: 3.7 g/dL (ref 3.5–5.2)
Alkaline Phosphatase: 45 U/L (ref 39–117)
CO2: 25 mEq/L (ref 19–32)
Chloride: 103 mEq/L (ref 96–112)
Creatinine, Ser: 1.18 mg/dL (ref 0.4–1.2)
GFR calc Af Amer: 53 mL/min — ABNORMAL LOW (ref >60)
GFR calc non Af Amer: 44 mL/min — ABNORMAL LOW (ref >60)
Potassium: 4.3 mEq/L (ref 3.5–5.1)
Total Bilirubin: 0.4 mg/dL (ref 0.3–1.2)

## 2011-02-07 LAB — CBC
HCT: 35.7 % — ABNORMAL LOW (ref 36.0–46.0)
HCT: 36.7 % (ref 36.0–46.0)
Hemoglobin: 12.4 g/dL (ref 12.0–15.0)
MCH: 33.7 pg (ref 26.0–34.0)
MCV: 97.5 fL (ref 78.0–100.0)
MCV: 97.8 fL (ref 78.0–100.0)
Platelets: 370 10*3/uL (ref 150–400)
RBC: 3.67 MIL/uL — ABNORMAL LOW (ref 3.87–5.11)
RBC: 3.75 MIL/uL — ABNORMAL LOW (ref 3.87–5.11)
RDW: 12.9 % (ref 11.5–15.5)
RDW: 13 % (ref 11.5–15.5)
WBC: 5.6 10*3/uL (ref 4.0–10.5)
WBC: 7.4 10*3/uL (ref 4.0–10.5)

## 2011-02-07 LAB — ABO/RH: ABO/RH(D): O POS

## 2011-02-07 LAB — SEDIMENTATION RATE: Sed Rate: 25 mm/hr — ABNORMAL HIGH (ref 0–22)

## 2011-02-07 LAB — URINALYSIS, ROUTINE W REFLEX MICROSCOPIC
Glucose, UA: NEGATIVE mg/dL
Hgb urine dipstick: NEGATIVE
Specific Gravity, Urine: 1.012 (ref 1.005–1.030)

## 2011-02-07 LAB — BASIC METABOLIC PANEL
BUN: 24 mg/dL — ABNORMAL HIGH (ref 6–23)
Chloride: 104 mEq/L (ref 96–112)
Creatinine, Ser: 1.22 mg/dL — ABNORMAL HIGH (ref 0.4–1.2)
Glucose, Bld: 89 mg/dL (ref 70–99)

## 2011-02-07 LAB — GLUCOSE, CAPILLARY: Glucose-Capillary: 86 mg/dL (ref 70–99)

## 2011-02-07 LAB — LIPID PANEL
Cholesterol: 180 mg/dL (ref 0–200)
LDL Cholesterol: 83 mg/dL (ref 0–99)
Triglycerides: 186 mg/dL — ABNORMAL HIGH (ref <150)
VLDL: 37 mg/dL (ref 0–40)

## 2011-02-07 LAB — TYPE AND SCREEN

## 2011-02-07 LAB — CARDIAC PANEL(CRET KIN+CKTOT+MB+TROPI)
Troponin I: 0.02 ng/mL (ref 0.00–0.06)
Troponin I: 0.02 ng/mL (ref 0.00–0.06)

## 2011-02-07 LAB — URINE MICROSCOPIC-ADD ON

## 2011-02-07 LAB — CK TOTAL AND CKMB (NOT AT ARMC): CK, MB: 1.2 ng/mL (ref 0.3–4.0)

## 2011-02-07 LAB — PROTIME-INR: Prothrombin Time: 12.9 seconds (ref 11.6–15.2)

## 2011-02-07 LAB — VITAMIN B12: Vitamin B-12: 440 pg/mL (ref 211–911)

## 2011-02-07 LAB — HEMOGLOBIN A1C: Hgb A1c MFr Bld: 5.6 % (ref <5.7)

## 2011-02-18 ENCOUNTER — Encounter: Payer: Self-pay | Admitting: Internal Medicine

## 2011-02-19 ENCOUNTER — Ambulatory Visit (AMBULATORY_SURGERY_CENTER): Payer: Medicare Other | Admitting: Internal Medicine

## 2011-02-19 ENCOUNTER — Encounter: Payer: Self-pay | Admitting: Internal Medicine

## 2011-02-19 VITALS — BP 145/78 | HR 76 | Temp 98.3°F | Resp 12 | Ht 59.0 in | Wt 109.0 lb

## 2011-02-19 DIAGNOSIS — K219 Gastro-esophageal reflux disease without esophagitis: Secondary | ICD-10-CM

## 2011-02-19 DIAGNOSIS — K2900 Acute gastritis without bleeding: Secondary | ICD-10-CM

## 2011-02-19 DIAGNOSIS — D649 Anemia, unspecified: Secondary | ICD-10-CM

## 2011-02-19 DIAGNOSIS — D1330 Benign neoplasm of unspecified part of small intestine: Secondary | ICD-10-CM

## 2011-02-19 DIAGNOSIS — R195 Other fecal abnormalities: Secondary | ICD-10-CM

## 2011-02-19 DIAGNOSIS — K5730 Diverticulosis of large intestine without perforation or abscess without bleeding: Secondary | ICD-10-CM

## 2011-02-19 DIAGNOSIS — K648 Other hemorrhoids: Secondary | ICD-10-CM

## 2011-02-19 DIAGNOSIS — D131 Benign neoplasm of stomach: Secondary | ICD-10-CM

## 2011-02-19 NOTE — Patient Instructions (Addendum)
Please read over Patient Discharge Instructions and Patient teaching(green sheet). Finding per Dr.Brodie are Hemorrhoids and Gastritis, see patient education sheets given on Hemorrhoids,gastritis, and high fiber diet please review. Stomach biopsies taken and you will receive a letter in your mail with result in about 2-3 weeks. Resume medical care with primary physician.

## 2011-02-20 ENCOUNTER — Telehealth: Payer: Self-pay | Admitting: *Deleted

## 2011-02-20 NOTE — Telephone Encounter (Signed)

## 2011-02-24 ENCOUNTER — Encounter: Payer: Self-pay | Admitting: Internal Medicine

## 2011-02-24 NOTE — Procedures (Signed)
Summary: Upper Endoscopy  Patient: [name] Note: All result statuses are Final unless otherwise noted.  Tests: (1) Upper Endoscopy (EGD)   EGD Upper Endoscopy       DONE     [facility]     [address]     [place], [place]  [address]          ENDOSCOPY PROCEDURE REPORT          PATIENT:  Alison Cordova, Alison Cordova  MR#:  [id number]     BIRTHDATE:  Sep 12, 1926, 84 yrs. old  GENDER:  female          ENDOSCOPIST:  Hedwig Morton. Juanda Chance, MD     Referred by:  Ancil Boozer, M.D.     Alroy Dust, M.D.          PROCEDURE DATE:  02/19/2011     PROCEDURE:  EGD with biopsy, [address]     ASA CLASS:  Class III     INDICATIONS:  hemoccult positive stool hx of Nissen Fundoplication               MEDICATIONS:   There was residual sedation effect present from     prior procedure. 4 mg IV, Fentanyl 37.5 mcg IV     TOPICAL ANESTHETIC:  Exactacain Spray          DESCRIPTION OF PROCEDURE:   After the risks and benefits of the     procedure were explained, informed consent was obtained.  The LB     GIF-H180 [id number] endoscope was introduced through the mouth and     advanced to the first portion of the duodenum.  The instrument was     slowly withdrawn as the mucosa was fully examined.     <<PROCEDUREIMAGES>>          s/p fundoplication (see image7 and image9).  Mild gastritis was     found. With standard forceps, a biopsy was obtained and sent to     pathology (see image8, image6, and image5).  Otherwise the     examination was normal. With standard forceps, a biopsy was     obtained and sent to pathology (see image2 and image3). duodenal     biopsies to r/o sprue    Retroflexed views revealed prior     fundoplication and no abnormalities.    The scope was then     withdrawn from the patient and the procedure completed.          COMPLICATIONS:  None          ENDOSCOPIC IMPRESSION:     1) S/p fundoplication     2) Mild gastritis     3) Otherwise normal examination     4) Prior  fundoplication     nothing to account for heme positive stool     RECOMMENDATIONS:     1) Await biopsy results     cont. meds          ______________________________     Hedwig Morton. Juanda Chance, MD          CC:          n.     eSIGNED:   Hedwig Morton. Lindyn Vossler at 02/19/2011 04:30 PM          [place], [place], [id number]  Note: An exclamation mark (!) indicates a result that was not dispersed into the flowsheet. Document Creation Date: 02/19/2011 4:30 PM _______________________________________________________________________  (1) Order result status: Final Collection or observation date-time:  02/19/2011 15:57 Requested date-time:  Receipt date-time:  Reported date-time:  Referring Physician:   Ordering Physician: Lina Sar [phone]) Specimen Source:  Source: Launa Grill Order Number: [id number] Lab site:

## 2011-02-24 NOTE — Procedures (Signed)
Summary: Colonoscopy  Patient: Alison Cordova Note: All result statuses are Final unless otherwise noted.  Tests: (1) Colonoscopy (COL)   COL Colonoscopy           DONE     [facility]     [address]     [place], [place]  [address]          COLONOSCOPY PROCEDURE REPORT          PATIENT:  Alison Cordova, Alison Cordova  MR#:  [id number]     BIRTHDATE:  1926/01/02, 84 yrs. old  GENDER:  female     ENDOSCOPIST:  Hedwig Morton. Juanda Chance, MD     REF. BY:  DR Alroy Dust     PROCEDURE DATE:  02/19/2011     PROCEDURE:  Colonoscopy 95[address]     ASA CLASS:  Class II     INDICATIONS:  heme positive stool on GYN exam, home stool test     negative for blood, last colon 2000 was normal     MEDICATIONS:   none          DESCRIPTION OF PROCEDURE:   After the risks benefits and     alternatives of the procedure were thoroughly explained, informed     consent was obtained.  Digital rectal exam was performed and     revealed no rectal masses.   The  endoscope was introduced through     the anus and advanced to the cecum, which was identified by both     the appendix and ileocecal valve, without limitations.  The     quality of the prep was good, using MoviPrep.  The instrument was     then slowly withdrawn as the colon was fully examined.          FINDINGS:  The examination was normal with no endoscopic findings.     Retroflexion was not performed.  The scope was then withdrawn from     the patient and the procedure completed. There were first grae     internal hemorrhoids          COMPLICATIONS:  None     ENDOSCOPIC IMPRESSION: First grade internal hemorrhoids, likely     source of heme positive stool          RECOMMENDATIONS: High fiber diet,          REPEAT EXAM:  No recall due to age          ______________________________     Hedwig Morton. Juanda Chance, MD          CC:          n.     eSIGNED:   Hedwig Morton. Luigi Stuckey at 02/19/2011 04:20 PM          [name], [name], [id number]  Note: An exclamation mark (!)  indicates a result that was not dispersed into the flowsheet. Document Creation Date: 02/19/2011 4:20 PM _______________________________________________________________________  (1) Order result status: Final Collection or observation date-time: 02/19/2011 16:11 Requested date-time:  Receipt date-time:  Reported date-time:  Referring Physician:   Ordering Physician: Lina Sar [phone]) Specimen Source:  Source: Launa Grill Order Number: [id number] Lab site:

## 2011-02-25 LAB — CBC
HCT: 39.2 % (ref 36.0–46.0)
Hemoglobin: 13.8 g/dL (ref 12.0–15.0)
MCHC: 35.1 g/dL (ref 30.0–36.0)
RDW: 11.9 % (ref 11.5–15.5)

## 2011-02-25 LAB — COMPREHENSIVE METABOLIC PANEL
Alkaline Phosphatase: 38 U/L — ABNORMAL LOW (ref 39–117)
BUN: 18 mg/dL (ref 6–23)
CO2: 26 mEq/L (ref 19–32)
GFR calc non Af Amer: 39 mL/min — ABNORMAL LOW (ref >60)
Glucose, Bld: 85 mg/dL (ref 70–99)
Potassium: 3.7 mEq/L (ref 3.5–5.1)
Total Protein: 8.3 g/dL (ref 6.0–8.3)

## 2011-02-25 LAB — DIFFERENTIAL
Basophils Absolute: 0 10*3/uL (ref 0.0–0.1)
Basophils Relative: 1 % (ref 0–1)
Monocytes Relative: 16 % — ABNORMAL HIGH (ref 3–12)
Neutro Abs: 2 10*3/uL (ref 1.7–7.7)
Neutrophils Relative %: 42 % — ABNORMAL LOW (ref 43–77)

## 2011-02-25 LAB — URINALYSIS, ROUTINE W REFLEX MICROSCOPIC
Glucose, UA: NEGATIVE mg/dL
Nitrite: NEGATIVE
Protein, ur: NEGATIVE mg/dL
pH: 6 (ref 5.0–8.0)

## 2011-02-25 LAB — URINE MICROSCOPIC-ADD ON

## 2011-02-25 LAB — CANCER ANTIGEN 27.29: CA 27.29: 21 U/mL (ref 0–39)

## 2011-03-19 ENCOUNTER — Other Ambulatory Visit: Payer: Self-pay | Admitting: Oncology

## 2011-03-19 ENCOUNTER — Encounter (HOSPITAL_BASED_OUTPATIENT_CLINIC_OR_DEPARTMENT_OTHER): Payer: Medicare Other | Admitting: Oncology

## 2011-03-19 DIAGNOSIS — Z8673 Personal history of transient ischemic attack (TIA), and cerebral infarction without residual deficits: Secondary | ICD-10-CM

## 2011-03-19 DIAGNOSIS — E876 Hypokalemia: Secondary | ICD-10-CM

## 2011-03-19 DIAGNOSIS — Z170 Estrogen receptor positive status [ER+]: Secondary | ICD-10-CM

## 2011-03-19 DIAGNOSIS — C50919 Malignant neoplasm of unspecified site of unspecified female breast: Secondary | ICD-10-CM

## 2011-03-19 LAB — CBC WITH DIFFERENTIAL/PLATELET
BASO%: 0.6 % (ref 0.0–2.0)
EOS%: 2.5 % (ref 0.0–7.0)
LYMPH%: 39.2 % (ref 14.0–49.7)
MCH: 34.6 pg — ABNORMAL HIGH (ref 25.1–34.0)
MCHC: 34.8 g/dL (ref 31.5–36.0)
MONO#: 1 10*3/uL — ABNORMAL HIGH (ref 0.1–0.9)
Platelets: 301 10*3/uL (ref 145–400)
RBC: 3.72 10*6/uL (ref 3.70–5.45)
WBC: 5.6 10*3/uL (ref 3.9–10.3)

## 2011-03-19 LAB — COMPREHENSIVE METABOLIC PANEL
ALT: 10 U/L (ref 0–35)
AST: 18 U/L (ref 0–37)
Alkaline Phosphatase: 48 U/L (ref 39–117)
CO2: 23 mEq/L (ref 19–32)
Creatinine, Ser: 1.53 mg/dL — ABNORMAL HIGH (ref 0.40–1.20)
Sodium: 142 mEq/L (ref 135–145)
Total Bilirubin: 0.4 mg/dL (ref 0.3–1.2)
Total Protein: 7.6 g/dL (ref 6.0–8.3)

## 2011-03-23 ENCOUNTER — Telehealth: Payer: Self-pay | Admitting: Internal Medicine

## 2011-03-23 NOTE — Telephone Encounter (Signed)
Spoke with patient and read her the letter mailed on 03/10/11. She states she did not get the letter. Mailed patient a copy again.

## 2011-04-07 NOTE — Op Note (Signed)
NAME:  Radle, Shamya                  ACCOUNT NO.:  [id number]   MEDICAL RECORD NO.:  [id number]          PATIENT TYPE:  AMB   LOCATION:  [facility]                         FACILITY:  [facility]   PHYSICIAN:  Martina Sinner, MD DATE OF BIRTH:  01/18/1926   DATE OF PROCEDURE:  09/20/2007  DATE OF DISCHARGE:                               OPERATIVE REPORT   PREOPERATIVE DIAGNOSIS:  Urethral stricture.   POSTOPERATIVE DIAGNOSIS:  Urethral stricture.   PROCEDURE:  Cystoscopy, urethral dilation.   Murle Hellstrom has urinary incontinence, risk factors for neurogenic  bladder, and on physical examination and during attempted cystoscopy,  she has a working diagnosis of a urethral stricture.   She does have obstructive voiding symptoms.   The patient was prepped and draped in the usual fashion.  She was given  preoperative antibiotics.   I used a 21-French scope for the examination.  With the obturator in  place, I was able to negotiate the scope on different angles and  navigate through the stricture.  I then cystoscoped the patient.  She  had grade 1 out for 4 bladder trabeculation.  The bladder mucosa and  trigone were normal.  There was no stitch, foreign body, or carcinoma.  I had a good look inside the urethra, and the urethra actually looked  soft and supple.   It is interesting to note whether or not she truly had a stricture or  whether or not the urethral axis was such that the usual scope or  catheter was catching at 6 o'clock.  When I felt tilted the scope  upward, it easily pass.  Having said that, I still felt that she was  dilated with the 21-French sheath.  I dilated again at the end of the  case with the obturator.  I did not feel it needed to be dilated further  to minimize urethral trauma.   I suspect Ms. Koppen will have a successful trial of voiding.  Hopefully  this will help some of her symptoms.  We will then start managing her  incontinence and poor flow.     ______________________________  Martina Sinner, MD  Electronically Signed     SAM/MEDQ  D:  09/20/2007  T:  09/20/2007  Job:  [id number]

## 2011-04-10 NOTE — Consult Note (Signed)
[place]. [facility]  Patient:    Alison Cordova, Alison Cordova Visit Number: [id number] MRN: [id number]          Service Type: MED Location: [phone] Attending Physician:  Verdene Lennert Dictated by:   Lewayne Bunting, M.D. [facility] Proc. Date: 03/20/02 Admit Date:  03/20/2002   CC:         Lorin Picket M. Kriste Basque, M.D. [facility]   Consultation Report  CHIEF COMPLAINT:  "I dont feel good, and I have chest pain as well as back pain (between shoulder blades."  HISTORY OF PRESENT ILLNESS:  The patient is a 77 year old female with a history of previous TIA and hypertension.  The patients husband was a former patient of Glennon Hamilton and died approximately a year ago.  The patient is followed by Dr. Kriste Basque.  Over the last few days, she has complained of chest pressure associated with back pain, she reports on the was back to Thursday. She was seen a week ago, was found to have some pollen exposures and was treated with antibiotics.  Due to unrelenting pain over the last few days, the patient took some muscle relaxants.  However, when she went back to see Dr. Kriste Basque, an electrocardiogram was done and showed no significant abnormalities.  However, due to ongoing pain which has been unrelenting with radiation to the back, she is now being admitted for further evaluation.  The patient also reports increased shortness of breath on exertion.  This is also a new finding over the last several days.  She has no orthopnea, PND, or signs of syncope.  Overall she feels weak and worn down.  The patient currently has pain of approximately 5/10 with radiation to the back and into the lower neck area.  However, there are no associated electrocardiographic changes.  ALLERGIES:  No known drug allergies.  MEDICATIONS: 1. Aspirin 325 mg. 2. Monopril 5 mg a day. 3. Tenoretic 50/12.5 mg p.o. q.d. 4. Synthroid 0.05 mg p.o. q.d. 5. Estradiol 1 q.d. 6. Pamelor 10 q.d. 7. Luvox 50 one and one-half tablets a  day. 8. Aricept q.h.s. 9. K-Dur 20 mEq q.d.  PAST MEDICAL HISTORY:  Ischemia workup in June 1997 negative Cardiolite.  TE May 1988, normal LV, mild MR and TR.  History of multiple TIAs. Diverticulosis, history of degenerative spine disease including lumbar spine. She has incontinence, hypothyroidism.  Cataracts, glaucoma, questionable surgery for reflux, history of hysterectomy, history of lumbar laminectomy.  SOCIAL HISTORY:  The patient lives in [place] with her daughter and son-in-law.  The patient is retired.  She has been widowed for one year.  The patients son is also a cardiac patient.  The patient stopped smoking three years ago, drinks occasional wine.  She denies drug use.  FAMILY HISTORY:  Mother died in her 49s of valvular heart disease.  Father died of suicide in World War I.  She has no siblings.  REVIEW OF SYSTEMS:  Occasional chills, no fevers.  No headache or sore throat. No rash or lesions.  Positive for chest pain and shortness of breath as outlined above.  No orthopnea or PND.  No frequency or dysuria.  Positive for weakness.  Back and shoulder pain, also myalgia and arthralgia.   No polyuria or polydipsia.  PHYSICAL EXAMINATION:  VITAL SIGNS:  Blood pressure 145/75, heart rate 67 beats per minute, respirations 16, O2 saturation 98% on room air.  GENERAL:  [place] female in no apparent distress.  HEENT:  Normocephalic, atraumatic.  PERRLA.  EOMI.  Sclerae clear.  NECK:  Supple.  No bruit, no JVD, no lymphadenopathy.  HEART:  Regular rate and rhythm, normal S1 and S2.   No murmurs, rubs, or gallops.  Pulses 2+ and equal without bruits.  LUNGS:  Clear, no wheezes.  SKIN:  No rash or lesions.  ABDOMEN:  Soft, nontender.  No hepatosplenomegaly.  GU/RECTAL:  Deferred.  EXTREMITIES:  No cyanosis, clubbing, or edema.  NEUROLOGIC:  The patient is alert and oriented.  Exam is nonfocal.  MUSCULOSKELETAL:  There is point tenderness in the lower back as well  as along the entire spinal column.  DIAGNOSTIC DATA:  Portable chest x-ray currently pending; it was done stat.  EKG: Sinus bradycardia, heart rate 57.  Normal intervals.  Nonspecific ST-T wave changes.  Hemoglobin 12.6, hematocrit 35.6, Tre[place]ow count 12.4, platelet count 372. Sodium 140, potassium 3.7, chloride 99, bicarb 30, BUN 15, creatinine 0.9. Cardiac enzymes within normal limits.  IMPRESSION:  Chest pain.  The patient reports both anterior and posterior chest pain radiation to the back.  Her presentation is rather atypical for an acute coronary syndrome despite her risk factor profile.  Chest pain has been going on now for several days, never really abated, and not associated with electrocardiographic changes.  She was also ruled out by one set of enzymes. The pain, to some extent, appeared to be positional and is reproduced by palpation of the spinal column.  However, I would suggest to rule out aortic dissection due to referred back pain.  A CT scan of the chest will be obtained tonight to rule out dissection.  Also, D-dimer level will be obtained to rule out the possibility of pulmonary embolism.  I do feel the patient will require an ischemia workup in hospital, and this can be achieved by doing a 2-D echocardiogram and Cardiolite study in the morning if at all possible.  DISPOSITION:  The patient will be evaluated with the above tests, and further recommendations will be given pending results. Dictated by:   Lewayne Bunting, M.D. [facility] Attending Physician:  Verdene Lennert DD:  03/20/02 TD:  03/20/02 Job: [id number]
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
## 2011-04-10 NOTE — H&P (Signed)
[place]. [facility]  Patient:    Alison Cordova, Alison Cordova Visit Number: [id number] MRN: [id number]          Service Type: MED Location: [phone] Attending Physician:  Verdene Lennert Dictated by:   Tammy Parrett, N.P. Admit Date:  03/20/2002   CC:         Cecil Cranker, M.D. [facility], cardiologist   History and Physical  DATE OF BIRTH:  13-Dec-1925  CARDIOLOGIST:  Cecil Cranker, M.D.  CHIEF COMPLAINT:  Chest pain, "feels as if something is sitting on my chest."  HISTORY OF PRESENT ILLNESS:  The patient is a 77 year old [name] patient of Dr. Kirstie Mirza who presents to the office for acute work-in visit for a three day history of chest pressure which she describes as constant in nature with intermittent heavy sensations of pressure.  Pain radiates into the back shoulder blades bilaterally, is increased with inspiration and movement. Patient has had a recent upper respiratory infection and treated with a Z pack on March 13, 2002 and states her cough has resolved.  She denies any associated diaphoresis, nausea, vomiting, extremity pain, palpitations, syncope, leg swelling, fever, recent travel.  Does complain that she has felt more short of breath, especially with exertion.  EKG was done in the office with noted lateral ST changes and poor R wave progression which seems to be a change since her EKG on January 2002.  Patient has a history significant for hypertension, hyperlipidemia, cerebrovascular disease, and a positive family history.  Her last Cardiolite was in 1997 which showed no ischemia and ejection fraction of 55%.  Due to her EKG changes and increased risk factors patient will need to be admitted for further evaluation to rule out possible myocardial infarction.  PAST MEDICAL HISTORY:  1. Asthmatic bronchitis in a former smoker.  2. Hypertension.  3. History of ischemic stroke in 1997.  4. Diet controlled diabetes mellitus.  5.  Hyperlipidemia.  6. Irritable bowel syndrome.  7. Diverticulosis.  8. Gastroesophageal reflux disease status post hiatal hernia repair.  9. Osteoarthritis status post lumbar laminectomy. 10. Hypothyroidism. 11. Depression. 12. Stress/urge urinary incontinence status post suprapubic vaginal sling. 13. Status post hysterectomy. 14. Status post tonsillectomy and adenoidectomy.  CURRENT MEDICATIONS:  1. Aspirin 325 mg daily.  2. Monopril 10 mg one-half tablet daily.  3. Tenoretic 50 mg/25 mg daily.  4. Synthroid 0.05 mg daily.  5. Estradiol 1 mg daily.  6. Xanax 0.25 mg one-half q.h.s. and p.r.n.  7. Pamelor 10 mg daily.  8. Luvox 50 mg one-and-a-half q.h.s.  9. Aricept 10 mg q.h.s. 10. Klor-Con 20 mEq daily. 11. Protegra daily. 12. Calcium 600 mg b.i.d. 13. Multivitamin daily. 14. Tylenol Arthritis p.r.n. 15. Lactaid p.r.n.  DRUG ALLERGIES:  No known drug allergies.  SOCIAL HISTORY:  Patient is widowed.  She has three children.  Remote smoking greater than 40 years ago.  Drinks one glass of wine daily.  FAMILY HISTORY:  Positive for coronary artery disease; otherwise, noncontributory.  REVIEW OF SYSTEMS:  NEUROLOGIC:  Patient denies any slurred speech, visual changes, extremity weakness.  HEENT:  Denies any headaches.  PULMONARY: Denies any wheezing, cough, fever.  CARDIAC:  Denies any presyncopal or syncopal episodes, palpitations.  GI:  Denies any melena, abdominal pain, nausea, vomiting, hematochezia.  EXTREMITIES:  Denies any joint pain or swelling.  SKIN:  Denies any rash.  PHYSICAL EXAMINATION:  GENERAL:  Patient is a frail, elderly, somewhat anxious [place] female  in no acute distress.  VITAL SIGNS:  Temperature 97.1, blood pressure 130/60, pulse 59, respiratory rate 16, O2 saturation 99% on room air, weight 106.5.  HEENT:  PERRLA.  EOMI without nystagmus.  Tongue is midline.  Posterior pharynx is clear.  TMs are normal.  Sclerae nonicteric.  NECK:  Supple  without adenopathy.  No JVD.  Carotids are equal with positive upstrokes bilaterally without any bruits.  No thyromegaly.  LUNGS:  Clear to auscultation bilaterally without any wheezing or crackles. Patient does have anterior chest wall tenderness to palpation.  No deformity or ecchymosis noted.  CARDIAC:  S1 and S2 with a grade 1/6 systolic murmur.  PMI is nondisplaced.  ABDOMEN:  Bowel sounds are positive throughout all four quadrants.  No palpable hepatosplenomegaly.  No abdominal masses or bruits appreciated.  No guarding or rebound noted.  Femoral pulses are intact without any bruits.  EXTREMITIES:  No calf tenderness, cyanosis, clubbing, or edema.  Moves all extremities well.  Positive pulses.  SKIN:  Without rash.  NEUROLOGIC:  Alert and oriented x3.  Somewhat anxious.  No focal deficits detected.  LABORATORY DATA:  EKG revealed some lateral ST changes and poor R wave progression.  Rate is at 58.  IMPRESSION AND PLAN:  Chest pain, rule out myocardial infarction.  Patient will be admitted to [facility] for further evaluation and possible management.  Cardiac enzymes, chest x-ray, EKG, and 2-D echo are currently pending.  Will follow up accordingly.  [facility] has been contacted for consultation.  We will follow up accordingly.Dictated by:   Tammy Parrett, N.P. Attending Physician:  Verdene Lennert DD:  03/20/02 TD:  03/20/02 Job: [id number]

## 2011-04-24 ENCOUNTER — Encounter (INDEPENDENT_AMBULATORY_CARE_PROVIDER_SITE_OTHER): Payer: Self-pay | Admitting: General Surgery

## 2011-06-25 ENCOUNTER — Ambulatory Visit: Payer: Medicare Other | Admitting: Pulmonary Disease

## 2011-07-03 ENCOUNTER — Encounter: Payer: Self-pay | Admitting: Pulmonary Disease

## 2011-07-03 ENCOUNTER — Ambulatory Visit (INDEPENDENT_AMBULATORY_CARE_PROVIDER_SITE_OTHER)
Admission: RE | Admit: 2011-07-03 | Discharge: 2011-07-03 | Disposition: A | Discharge status: Home / Self Care | Payer: Medicare Other | Source: Ambulatory Visit | Attending: Pulmonary Disease | Admitting: Pulmonary Disease

## 2011-07-03 ENCOUNTER — Ambulatory Visit (INDEPENDENT_AMBULATORY_CARE_PROVIDER_SITE_OTHER): Payer: Medicare Other | Admitting: Pulmonary Disease

## 2011-07-03 ENCOUNTER — Telehealth: Payer: Self-pay | Admitting: Pulmonary Disease

## 2011-07-03 DIAGNOSIS — I679 Cerebrovascular disease, unspecified: Secondary | ICD-10-CM

## 2011-07-03 DIAGNOSIS — J42 Unspecified chronic bronchitis: Secondary | ICD-10-CM

## 2011-07-03 DIAGNOSIS — I6350 Cerebral infarction due to unspecified occlusion or stenosis of unspecified cerebral artery: Secondary | ICD-10-CM

## 2011-07-03 DIAGNOSIS — I10 Essential (primary) hypertension: Secondary | ICD-10-CM

## 2011-07-03 DIAGNOSIS — M159 Polyosteoarthritis, unspecified: Secondary | ICD-10-CM

## 2011-07-03 DIAGNOSIS — C50919 Malignant neoplasm of unspecified site of unspecified female breast: Secondary | ICD-10-CM

## 2011-07-03 DIAGNOSIS — K589 Irritable bowel syndrome without diarrhea: Secondary | ICD-10-CM

## 2011-07-03 DIAGNOSIS — E7800 Pure hypercholesterolemia, unspecified: Secondary | ICD-10-CM

## 2011-07-03 DIAGNOSIS — E039 Hypothyroidism, unspecified: Secondary | ICD-10-CM

## 2011-07-03 DIAGNOSIS — K5730 Diverticulosis of large intestine without perforation or abscess without bleeding: Secondary | ICD-10-CM

## 2011-07-03 DIAGNOSIS — F341 Dysthymic disorder: Secondary | ICD-10-CM

## 2011-07-03 DIAGNOSIS — K219 Gastro-esophageal reflux disease without esophagitis: Secondary | ICD-10-CM

## 2011-07-03 DIAGNOSIS — J309 Allergic rhinitis, unspecified: Secondary | ICD-10-CM

## 2011-07-03 MED ORDER — FAMOTIDINE 20 MG PO TABS: 20.0000 mg | ORAL_TABLET | Freq: Two times a day (BID) | ORAL | Status: DC | PRN

## 2011-07-03 MED ORDER — FLUTICASONE PROPIONATE 50 MCG/ACT NA SUSP: 2.0000 | Freq: Every day | NASAL | Status: DC

## 2011-07-03 NOTE — Patient Instructions (Signed)
Today we updated your med list in EPIC...    We refilled your [facility] per request...  Today we did your follow up CXR...    Please call the [facility]REE in a few days for your results...    Dial [id number] & when prompted enter your patient number followed by the # symbol...    Your patient number is:  [id number]#  Stay as active as possible...  Call for any questions...  Let's plan a follow up visit in 6 months.Marland KitchenMarland Kitchen

## 2011-07-03 NOTE — Progress Notes (Signed)
Subjective:    Patient ID: Alison Cordova, female    DOB: August 28, 1926, 77 y.o.   MRN: [id number]  HPI 77 y/o WF here for a follow up visit... she has mult med problems as noted below... she is HOH and had a cochlear implant yrs ago... she is 77 y/o and rather frail (lives w/ her daughter & her family)...  ~  February 19, 2010:  after she was here last she had an abn mammogram and surg 11/10 by [facility]-  left total mastectomy & sentinel node bx= 2.2cm invasive ductal carcinoma & 1/3 nodes pos for micromets... oncology per [facility] on aromatase inhibitor ARIMIDEX 1mg /d... last seen 01/17/10 f/u & note reviewed... she continues to see DrSanders for Ophthalmology w/ series of AVASTIN shots that seem to be helping according to the pt....  BP controlled on meds, due for CXR (NAD- RML scarring, left mastectomy, DJD sp) & fasting labs (all essent WNL- creat=1.4)... OK TDAP today.  ~  June 26, 2010:  Hosp 7/17-19 w/ TIA (right facial numbness & tingling which resolved spont)... MRI w/o acute change- atrophy, sm vessel dis, scat lacunes, CSpine dis;  MRA showed mild to mod atherosclerotic changes, 50% left vertebral stenosis, ?3mm aneurysmal dil right paraophthalmic art... ONLY MED CHANGE WAS PLAVIX SWITCHED TO "POINT STUDY" DRUG (Placebo vs Plavix)> followed by DrSethi... BP controlled on meds;  Chol looks good on Simva20;  Thyroid regulated w/ Levothy50;  Prilosec changed to PEPCID 20mg /d...   ~  December 25, 2010:  32mo ROV- doing satis overall> f/u labs are satis x FLP not at goal on Simva20 & reminded re: low chol/ low fat diet... she states stools were pos for blood per GYN & needs f/u DrDBrodie (stool cards here are neg)...    She tells me that DrSethi switched her from ASA + study drug to Aggrenox Rx but she is upset w/ the cost factor & will discuss alternatives w/ Neurology...    She had Oncology f/u [facility] 12/11> doing satis on Anastozole1mg /d, no changes made... she also saw her surgeon, DrIngram 11/11 &  similarly stable, doing well, no changes.  ~  July 03, 2011:  32mo ROV & she reports stable; we reviewed meds but she didn't bring her bottles> she notes that DrSethi changed her Aggrenox (too $$) to a coated ASA 325mg /d & she's been stable w/o cerebral ischemic symptoms;  She has hx recurrent bronchitic infections in the past but no recent problems;  BP controlled on Aten/Hct;  Chol stable on simva20 but needs better diet;  Etc...    She saw DrDBrodie 3/12 for GI eval due to heme pos stools> Colonoscopy showed Hems otherw neg, & rec hi fiber diet; She also has hx remote Nissen & EGD showed mild gastitis- REC to take Prilosec vs Pepcid...     She also saw DrHa for Oncology f/u 4/12 & stable on Arimidex...    CXR today shows ?12mm nodule right lung base> ?assoc w/ ant tip of rib? We will sched CT Chest for Further delineation...          Problem List:  GLAUCOMA (ICD-365.9) - followed by DrHollander on gtts... ~  5/10: sent to retina spec & saw DrSanders 5/10 w/ macular edema & hemorrhage- s/p laser Rx x2. ~  subseq started on series of AVASTIN shots in the eyes that seem to be helping.  ALLERGIC RHINITIS (ICD-477.9) - on OTC Zyretek, & FLONASE...  BRONCHITIS, RECURRENT (ICD-491.9) - she is an ex-smoker... she  had hx of LLL pneumonia in 2000... she denies cough, sputum, change in SOB, etc... ~  CXR 3/11 showed RML scarring, otherw clear lungs, DJD sp, left mastectomy... ~  CXR 8/12 showed ?12mm RLL nodule (near an ant rib tip) ==> we will proceed w/ CTChest  HYPERTENSION (ICD-401.9) - on ATENOLOL/Hct 50-25 daily + K20/d & LISINOPRIL 20mg /d...  BP=136/64 today and similar on home checks... she denies CP, palpit, change in SOB...   CEREBROVASCULAR DISEASE (ICD-437.9) - Hosp 7/11 w/ TIA (right facial numbness & tingling which resolved spont)...  ~  MRI 7/11w/o acute change- atrophy, sm vessel dis, scat lacunes, CSpine dis;  MRA showed mild to mod atherosclerotic changes, 50% left vertebral  stenosis, ?3mm aneurysmal dil right paraophthalmic art... followed by DrSethi> initially on study drug, then switched to AGGRENOX Bid, but she refused due to cost, then changed to ASA 325mg /d.  HYPERCHOLESTEROLEMIA (ICD-272.0) - on SIMVASTATIN 20mg /d + Fish Oil daily... ~  FLP 4/08 on Simva20 showed TChol 178, TG 171, HDL 71, LDL 73 ~  FLP 4/09 showed TChol 222, TG 112, HDL 109, LDL 95... continue same- GREAT HDL!!! ~  FLP 3/10 on Simva20 showed TChol 179, TG 176, HDL 81, LDL 63... continue same. ~  FLP 9/10 on Simva 20 showed TChol 182, TG 174, HDL 77, LDL 70 ~  FLP 3/11 on Simva20 showed TChol 199, TG 139, HDL 88, LDL 84 ~  FLP 7/11 in hosp on Simva20 showed TChol 180, TG 186, HDL 60, LDL 83 ~  FLP 2/12 on Simva20 showed TChol 217, TG 163, HDL 68, LDL 128... reminded about diet + med.  HYPOTHYROIDISM (ICD-244.9) - on SYNTHROID 16mcg/d... ~  labs 4/08 showed TSH = 0.33 ~  labs 4/09 showed TSH= 1.33 ~  labs 3/10 showed TSH= 1.42 ~  labs 3/11 showed TSH= 1.59 ~  labs 7/11 in hosp showed TSH= 1.63 ~  labs 2/12 showed TSH= 1.89  GERD (ICD-530.81) - she has noted some GERD symptoms: rec> Pepcid 20mg  OTC daily but she uses Prn. ~  EGD 3/12 by DrDBrodie showed evid of prev fundoplication, mild gastitis... DIVERTICULOSIS OF COLON (ICD-562.10) -  ~  Colonoscopy 11/06 by DrBrodie was normal, no change from 2000... IRRITABLE BOWEL SYNDROME (ICD-564.1) HEMORRHOIDS >> heme pos stool 3/12 was evaluated by DrDBrodie & determined to be due to Hems... ~  Colonoscopy 3/12 was neg x internal hemorrhoids  Hx of URINARY INCONTINENCE (ICD-788.30) - she saw DrPeterson and had urodynamic studies in 2008... she wears pads.  ADENOCARCINOMA, LEFT BREAST (ICD-174.9) - diagnosed w/ left breast adenocarcinoma 11/10 w/ total left mastectomy, sentinel node bx (micro mets in 1/3 nodes), & adjuvant hormonal therapy w/ ANASTROZOLE 1mg /d followed by Lenis Noon since 11/10.  DEGENERATIVE JOINT DISEASE, GENERALIZED  (ICD-715.00)  CVA (ICD-434.91) - on ASA 325mg /d and then POINT Study drug (Placebo vs Plavix)> then AGGRENOX Bid per neuro but she couldn't afford & switched to ASA 325mg /d. ~  she was seen by Autumn Patty in 2001 w/ studies showing bilat sm vessel ischemic strokes... ~  [facility] 7/11 w/ TIA << SEE ABOVE >>  HEADACHE (ICD-784.0) Hx of PERIPHERAL NEUROPATHY (ICD-356.9) - takes NORTRIPTYLINE 10mg - 2tabsQhs (per Psyche)... c/o her feet bothering her at night...   DYSTHYMIA (ICD-300.4) - on LUVOX + NORTRIPTYLINE per Psychiatry, DrPamPittman... incr stress- 56y/o son w/ MI...  ANEMIA (ICD-285.9) ~  abs 4/09 showed Hg= 13.7 ~  labs 3/11 showed Hg= 12.6 ~  labs 7/11 in hosp showed Hg= 12.6 ~  labs 2/12 showed  Hg= 12.6  Health Maintenance - GYN = prev DrMcPail, but saw new GYN 2011 & told no further evals needed... Mammograms at [facility] (Mammogram 10/10 w/ left breast mass- referred to [facility] & Bx pos for infiltrating ductal cancer)...  BMD here 9/10 showed TScores +5.3 in spine & -0.2 in [facility]... Vit D level 4/09 was 37 & Rx w/ OTC 1000 u daily... she gets yearly Flu shots;  had Pneumovax 71yrs ago;  given TDAP 3/11.   Past Surgical History  Procedure Date  . Vaginal hysterectomy   . Spine surgery   .  fundiplication   . Eye surgery     TO DESOLVE AN AREA IN HER LEFT EYE?    Outpatient Encounter Prescriptions as of 07/03/2011  Medication Sig Dispense Refill  . acetaminophen (TYLENOL) 650 MG CR tablet Take 650 mg by mouth every 8 (eight) hours as needed.        Marland Kitchen anastrozole (ARIMIDEX) 1 MG tablet Take 1 mg by mouth daily.        Marland Kitchen aspirin 325 MG tablet Take 325 mg by mouth daily.        Marland Kitchen atenolol-chlorthalidone (TENORETIC) 50-25 MG per tablet Take 1 tablet by mouth daily.        . Calcium Carbonate-Vitamin D (CALCIUM 600+D) 600-400 MG-UNIT per tablet Take 1 tablet by mouth 2 (two) times daily.        . cetirizine (ZYRTEC) 10 MG tablet Take 10 mg by mouth daily.        . famotidine (PEPCID)  20 MG tablet Take 20 mg by mouth 2 (two) times daily as needed.        . fish oil-omega-3 fatty acids 1000 MG capsule Take 2 g by mouth daily.        . fluocinonide (LIDEX) 0.05 % cream Apply topically 2 (two) times daily.        . fluticasone (FLONASE) 50 MCG/ACT nasal spray 2 sprays by Nasal route daily.        . fluvoxaMINE (LUVOX) 100 MG tablet Take 100 mg by mouth at bedtime.        Marland Kitchen latanoprost (XALATAN) 0.005 % ophthalmic solution Place 1 drop into both eyes at bedtime.        Marland Kitchen levothyroxine (SYNTHROID, LEVOTHROID) 50 MCG tablet Take 50 mcg by mouth daily.        Marland Kitchen lisinopril (PRINIVIL,ZESTRIL) 20 MG tablet Take 20 mg by mouth daily.        . Multiple Vitamins-Minerals (WOMENS MULTIVITAMIN PLUS PO) Take 1 tablet by mouth daily.        . nortriptyline (PAMELOR) 10 MG capsule Take 10 mg by mouth at bedtime.        . potassium chloride SA (K-DUR,KLOR-CON) 20 MEQ tablet Take 20 mEq by mouth 2 (two) times daily.        . simvastatin (ZOCOR) 20 MG tablet Take 20 mg by mouth at bedtime.          No Known Allergies   Current Medications, Allergies, Past Medical History, Past Surgical History, Family History, and Social History were reviewed in [employer] record.    Review of Systems       See HPI - all other systems neg except as noted...  The patient complains of dyspnea on exertion.  The patient denies anorexia, fever, weight loss, weight gain, vision loss, decreased hearing, hoarseness, chest pain, syncope, peripheral edema, prolonged cough, headaches, hemoptysis, abdominal pain, melena, hematochezia, severe indigestion/heartburn,  hematuria, incontinence, muscle weakness, suspicious skin lesions, transient blindness, difficulty walking, depression, unusual weight change, abnormal bleeding, enlarged lymph nodes, and angioedema.     Objective:   Physical Exam     WD, Thin, 77 y/o WF in NAD... she is chr ill appearing...  GENERAL:  Alert & oriented; pleasant &  cooperative... HEENT:  [place]/AT, EACs-clear, TMs-wnl, Cochlear implant, NOSE-clear, THROAT-clear & wnl. NECK:  Supple w/ fairROM; no JVD; normal carotid impulses w/o bruits; no thyromegaly or nodules palpated; no lymphadenopathy. CHEST:  s/p left mastectomy, clear lungs w/o wheezing, rales, or rhonchi... HEART:  Regular Rhythm;  g 1/6 SEM without rubs or gallops... ABDOMEN:  Soft & nontender; normal bowel sounds; no organomegaly or masses detected. EXT: without deformities, mod arthritic changes; no varicose veins/ +venous insuffic/ no edema.  NEURO:  CN's intact;  no asymmetry;  no focal neuro deficits... DERM:  No lesions noted; no rash etc...   Assessment & Plan:   Hx recurrent bronchitis/ ?RLL nodule>  We will proceed w/ CTChest for further eval...  HBP>  Controlled on meds, continue same...  CEREBROVASC Dis/ TIA>  As noted she is currently taking ASA 325mg /d & has not had any cerebral ischemic symptoms.  CHOL>  On Simva20 + diet & reminded to take med regularly...  HYPOTHYROID>  Stable on Synthroid 68mcg/d...  GI>  Gastitis, Hx NissenFundoplication, Divertics, IBS, Hems>  Recent GI eval DrDBrodie reviewed- BRB in stool [facility] from [facility] & rx by GI...  Breast Cancer>  Followed by Lenis Noon on Armidex & stable...  DJD>  She uses OTC anti-inflamm as needed...  DYSTHYMIA>  Followed by DrPPittman on Luvox & Nortriptyline.Marland KitchenMarland Kitchen

## 2011-07-03 NOTE — Telephone Encounter (Signed)
pepcid has been sent to the pharmacy---called and spoke with the pt and she is aware.

## 2011-07-04 ENCOUNTER — Encounter: Payer: Self-pay | Admitting: Pulmonary Disease

## 2011-07-06 ENCOUNTER — Telehealth: Payer: Self-pay | Admitting: Pulmonary Disease

## 2011-07-06 ENCOUNTER — Other Ambulatory Visit: Payer: Self-pay | Admitting: Pulmonary Disease

## 2011-07-06 DIAGNOSIS — R911 Solitary pulmonary nodule: Secondary | ICD-10-CM

## 2011-07-06 DIAGNOSIS — I10 Essential (primary) hypertension: Secondary | ICD-10-CM

## 2011-07-06 HISTORY — DX: Solitary pulmonary nodule: R91.1

## 2011-07-06 NOTE — Telephone Encounter (Signed)
Pt returned call unsure who to call, checked with LA and she advised she did call pt, transferred to LA.

## 2011-07-08 ENCOUNTER — Other Ambulatory Visit (INDEPENDENT_AMBULATORY_CARE_PROVIDER_SITE_OTHER): Payer: Medicare Other

## 2011-07-08 DIAGNOSIS — I10 Essential (primary) hypertension: Secondary | ICD-10-CM

## 2011-07-08 LAB — BASIC METABOLIC PANEL
BUN: 25 mg/dL — ABNORMAL HIGH (ref 6–23)
CO2: 29 mEq/L (ref 19–32)
Calcium: 10.1 mg/dL (ref 8.4–10.5)
Creatinine, Ser: 1.6 mg/dL — ABNORMAL HIGH (ref 0.4–1.2)
GFR: 32.73 mL/min — ABNORMAL LOW (ref >60.00)
Glucose, Bld: 96 mg/dL (ref 70–99)

## 2011-07-10 ENCOUNTER — Ambulatory Visit (INDEPENDENT_AMBULATORY_CARE_PROVIDER_SITE_OTHER)
Admission: RE | Admit: 2011-07-10 | Discharge: 2011-07-10 | Disposition: A | Discharge status: Home / Self Care | Payer: Medicare Other | Source: Ambulatory Visit | Attending: Pulmonary Disease | Admitting: Pulmonary Disease

## 2011-07-10 DIAGNOSIS — J984 Other disorders of lung: Secondary | ICD-10-CM

## 2011-07-10 DIAGNOSIS — R911 Solitary pulmonary nodule: Secondary | ICD-10-CM

## 2011-07-10 MED ORDER — IOHEXOL 300 MG/ML  SOLN
80.0000 mL | Freq: Once | INTRAMUSCULAR | Status: AC | PRN
  Administered 2011-07-10: 80 mL via INTRAVENOUS

## 2011-07-13 ENCOUNTER — Telehealth: Payer: Self-pay | Admitting: *Deleted

## 2011-07-13 NOTE — Telephone Encounter (Signed)
Per SN advise the pt that CT chest on 8-17 was negative-it looks good. No suspicious nodules, no adenopathy, just some hardening of the arteries and arthritis in spine. Pt aware of results.Carron Curie, CMA

## 2011-07-22 ENCOUNTER — Encounter (HOSPITAL_BASED_OUTPATIENT_CLINIC_OR_DEPARTMENT_OTHER): Payer: Medicare Other | Admitting: Oncology

## 2011-07-22 ENCOUNTER — Telehealth: Payer: Self-pay | Admitting: Pulmonary Disease

## 2011-07-22 ENCOUNTER — Other Ambulatory Visit: Payer: Self-pay | Admitting: Oncology

## 2011-07-22 DIAGNOSIS — M1990 Unspecified osteoarthritis, unspecified site: Secondary | ICD-10-CM

## 2011-07-22 DIAGNOSIS — Z8673 Personal history of transient ischemic attack (TIA), and cerebral infarction without residual deficits: Secondary | ICD-10-CM

## 2011-07-22 DIAGNOSIS — E876 Hypokalemia: Secondary | ICD-10-CM

## 2011-07-22 DIAGNOSIS — Z170 Estrogen receptor positive status [ER+]: Secondary | ICD-10-CM

## 2011-07-22 DIAGNOSIS — C50919 Malignant neoplasm of unspecified site of unspecified female breast: Secondary | ICD-10-CM

## 2011-07-22 LAB — COMPREHENSIVE METABOLIC PANEL
ALT: 13 U/L (ref 0–35)
AST: 18 U/L (ref 0–37)
Alkaline Phosphatase: 42 U/L (ref 39–117)
BUN: 29 mg/dL — ABNORMAL HIGH (ref 6–23)
Calcium: 10.2 mg/dL (ref 8.4–10.5)
Creatinine, Ser: 1.66 mg/dL — ABNORMAL HIGH (ref 0.50–1.10)
Total Bilirubin: 0.4 mg/dL (ref 0.3–1.2)

## 2011-07-22 LAB — CBC WITH DIFFERENTIAL/PLATELET
BASO%: 0.5 % (ref 0.0–2.0)
Basophils Absolute: 0 10*3/uL (ref 0.0–0.1)
EOS%: 2.2 % (ref 0.0–7.0)
HCT: 33.6 % — ABNORMAL LOW (ref 34.8–46.6)
HGB: 11.8 g/dL (ref 11.6–15.9)
MCH: 34 pg (ref 25.1–34.0)
MCHC: 35 g/dL (ref 31.5–36.0)
MCV: 97.2 fL (ref 79.5–101.0)
MONO%: 15.7 % — ABNORMAL HIGH (ref 0.0–14.0)
NEUT%: 44 % (ref 38.4–76.8)

## 2011-07-22 NOTE — Telephone Encounter (Signed)
[id number].Jennifer Castillo, CMA  

## 2011-07-23 NOTE — Telephone Encounter (Signed)
Pt says that Dr. Gaylyn Rong is requesting that the pt have a BMD due to chemo treatment and pt wants to have this done here at our facility. Pls advise if this is okay to order under SN. Also, will diagnosis of DJD cover this test? Pls advise.

## 2011-07-23 NOTE — Telephone Encounter (Signed)
Per Leigh, it is okay to send order for BMD under SN and DJD should be a good diagnosis code for this. Order sent to [facility]. Pt made aware

## 2011-07-28 ENCOUNTER — Ambulatory Visit
Admission: RE | Admit: 2011-07-28 | Discharge: 2011-07-28 | Disposition: A | Discharge status: Home / Self Care | Payer: Medicare Other | Source: Ambulatory Visit | Attending: Pulmonary Disease | Admitting: Pulmonary Disease

## 2011-07-28 DIAGNOSIS — M1990 Unspecified osteoarthritis, unspecified site: Secondary | ICD-10-CM

## 2011-08-27 ENCOUNTER — Ambulatory Visit (INDEPENDENT_AMBULATORY_CARE_PROVIDER_SITE_OTHER)
Admission: RE | Admit: 2011-08-27 | Discharge: 2011-08-27 | Disposition: A | Discharge status: Home / Self Care | Payer: Medicare Other | Source: Ambulatory Visit | Attending: Pulmonary Disease | Admitting: Pulmonary Disease

## 2011-08-27 DIAGNOSIS — Z13820 Encounter for screening for osteoporosis: Secondary | ICD-10-CM

## 2011-09-01 ENCOUNTER — Encounter: Payer: Self-pay | Admitting: Pulmonary Disease

## 2011-09-02 LAB — POCT I-STAT 4, (NA,K, GLUC, HGB,HCT)
Glucose, Bld: 83
HCT: 45
Hemoglobin: 15.3 — ABNORMAL HIGH
Potassium: 3.4 — ABNORMAL LOW

## 2011-09-17 ENCOUNTER — Encounter: Payer: Self-pay | Admitting: Pulmonary Disease

## 2011-10-26 ENCOUNTER — Encounter (INDEPENDENT_AMBULATORY_CARE_PROVIDER_SITE_OTHER): Payer: Self-pay | Admitting: General Surgery

## 2011-10-26 ENCOUNTER — Encounter: Payer: Self-pay | Admitting: *Deleted

## 2011-10-26 ENCOUNTER — Ambulatory Visit (INDEPENDENT_AMBULATORY_CARE_PROVIDER_SITE_OTHER): Payer: Medicare Other | Admitting: General Surgery

## 2011-10-26 VITALS — BP 162/72 | HR 68 | Temp 97.6°F | Resp 18 | Ht 59.0 in | Wt 115.1 lb

## 2011-10-26 DIAGNOSIS — Z853 Personal history of malignant neoplasm of breast: Secondary | ICD-10-CM

## 2011-10-26 NOTE — Patient Instructions (Signed)
Your breast exam today shows no evidence of cancer on either side. Your recent mammograms of the right breast are normal. We have discussed long-term followup. You have decided that you will get annual mammograms and you will get annual breast exam either through Dr. Gaylyn Rong or Dr. Kriste Basque. Return to see me if new problems arise.

## 2011-10-26 NOTE — Progress Notes (Signed)
Patient ID: Alison Cordova, female   DOB: 07/14/1926, 77 y.o.   MRN: [id number]  Chief Complaint  Patient presents with  . Breast Cancer Long Term Follow Up    breast check     HPI Alison Cordova is a 77 y.o. female.    Alison Cordova underwent left total mastectomy and left sentinel biopsy on September 26, 2009.  Final pathologic stage was T2, N1, receptor positive, Ki-67 26%, HER-2 negative.  She has no complaints about her left mastectomy wound or her right breast. Mammograms were performed at [facility] on September 08, 2011. They are category 2, benign findings.  She continues to follow with Dr. Jethro Bolus on a regular basis. She is taking a rim and fracture. She continues to follow regularly with Dr. Alroy Dust who manages her numerous medical problems, including hypertension, stroke, hyperlipidemia, GERD, hypothyroidism. HPI  Past Medical History  Diagnosis Date  . Allergy     SEASONAL  . Depression   . Anxiety   . Arthritis   . Cancer     LEFT BREAST CANCER  . Cataract   . Stroke   . Glaucoma   . Hyperlipidemia   . Hypertension   . Thyroid disease     Past Surgical History  Procedure Date  . Vaginal hysterectomy   . Spine surgery   .  fundiplication   . Eye surgery     TO DESOLVE AN AREA IN HER LEFT EYE?  . Breast surgery     left breast mastectomy     History reviewed. No pertinent family history.  Social History History  Substance Use Topics  . Smoking status: Former Smoker    Types: Cigarettes    Quit date: 11/23/1962  . Smokeless tobacco: Never Used  . Alcohol Use: Yes     PT STATES DRINKS A BEER OR WINE OCCASSIONALLY, NOT WEEKLY    No Known Allergies  Current Outpatient Prescriptions  Medication Sig Dispense Refill  . acetaminophen (TYLENOL) 650 MG CR tablet Take 650 mg by mouth every 8 (eight) hours as needed.        Marland Kitchen anastrozole (ARIMIDEX) 1 MG tablet Take 1 mg by mouth daily.        Marland Kitchen aspirin 325 MG tablet Take 325 mg by mouth daily.          Marland Kitchen atenolol-chlorthalidone (TENORETIC) 50-25 MG per tablet Take 1 tablet by mouth daily.        . Calcium Carbonate-Vitamin D (CALCIUM 600+D) 600-400 MG-UNIT per tablet Take 1 tablet by mouth 2 (two) times daily.        . cetirizine (ZYRTEC) 10 MG tablet Take 10 mg by mouth daily.        . famotidine (PEPCID) 20 MG tablet Take 1 tablet (20 mg total) by mouth 2 (two) times daily as needed.  60 tablet  11  . fish oil-omega-3 fatty acids 1000 MG capsule Take 2 g by mouth daily.        . fluocinonide (LIDEX) 0.05 % cream Apply topically 2 (two) times daily.        . fluticasone (FLONASE) 50 MCG/ACT nasal spray Place 2 sprays into the nose daily.  16 g  11  . fluvoxaMINE (LUVOX) 100 MG tablet Take as directed by Dr. Raquel James      . latanoprost (XALATAN) 0.005 % ophthalmic solution Place 1 drop into both eyes at bedtime.        Marland Kitchen levothyroxine (SYNTHROID, LEVOTHROID) 50  MCG tablet Take 50 mcg by mouth daily.        Marland Kitchen lisinopril (PRINIVIL,ZESTRIL) 20 MG tablet Take 20 mg by mouth daily.        . Multiple Vitamins-Minerals (WOMENS MULTIVITAMIN PLUS PO) Take 1 tablet by mouth daily.        . nortriptyline (PAMELOR) 10 MG capsule Take 10 mg by mouth at bedtime.        . potassium chloride SA (K-DUR,KLOR-CON) 20 MEQ tablet Take 20 mEq by mouth daily.       . simvastatin (ZOCOR) 20 MG tablet Take 20 mg by mouth at bedtime.          Review of Systems Review of Systems 12 system review of systems is performed and is negative except as described above. Blood pressure 162/72, pulse 68, temperature 97.6 F (36.4 C), temperature source Temporal, resp. rate 18, height 4\' 11"  (1.499 m), weight 115 lb 2 oz (52.22 kg).  Physical Exam Physical Exam Constitutional: the patient is elderly but alert. She and her legs independently. In no distress.  Neck:       no adenopathy. No mass. No jugular venous distention.  Lungs:     clear to auscultation. No chest wall tenderness.  Breasts: left mastectomy wound is  well-healed. No nodules, ulceration, or adenopathy. Right breast is soft, a little atrophic, no skin changes, no mass, no adenopathy.  Skin:        warm and dry. No rashes. Data Reviewed I have reviewed her old surgical records and pathology reports. I have reviewed the notes from the [facility] cancer [facility]. I reviewed her recent mammograms.  Assessment    Invasive carcinoma, left breast, 2.2 cm, pathologic stage T2, N1, receptor positive, Ki-67 of 26%, HER-2 negative.  No evidence of recurrence 2 years following left total mastectomy and sentinel liver biopsy.  Local medical problems include hypertension, stroke, hyperlipidemia, hypothyroidism, GERD.    Plan    She is advised to get annual mammography, and she states she will do that.  She will need annual breast exam. We spent a long time talking about this, and it was her desire to continue regular followup with Dr. Milus Glazier Dr. Alroy Dust and she will ask them to do her annual breast exam.  Return to see me p.r.n.       Reno Clasby M 10/26/2011, 9:44 AM

## 2011-11-02 ENCOUNTER — Telehealth: Payer: Self-pay | Admitting: Pulmonary Disease

## 2011-11-02 MED ORDER — AZITHROMYCIN 250 MG PO TABS: ORAL_TABLET | ORAL | Status: AC

## 2011-11-02 NOTE — Telephone Encounter (Signed)
Spoke with pt. She is c/o dry cough and chest congestion x 2 days- states that she has had no fever, but has had chills and sweats. She states that she generally just "feels weak and bad". She states would like something called to pharm for this. Please advise, thanks! No Known Allergies

## 2011-11-02 NOTE — Telephone Encounter (Signed)
Called and spoke with pt and she is aware per SN---zpak to take as directed, tylenol, rest and increase fluids.  Pt voiced her understanding of this and will call back for any problems

## 2011-11-04 ENCOUNTER — Other Ambulatory Visit: Payer: Self-pay | Admitting: Oncology

## 2011-11-04 ENCOUNTER — Telehealth: Payer: Self-pay | Admitting: *Deleted

## 2011-11-04 ENCOUNTER — Telehealth: Payer: Self-pay | Admitting: Oncology

## 2011-11-04 NOTE — Telephone Encounter (Signed)
S/w the pt's dtr regarding the pt's r/s appts from dec to [facility] 2013

## 2011-11-04 NOTE — Telephone Encounter (Signed)
Rec'd call in triage from patient that she and several family members have been sick, she has been started on antibiotics and does not feel she needs to be here near patients tomorrow for the risk of infecting others. Agreed with patient that it would be best to stay home if this is a routine appt. Patient states she has enough of meds to last until she can reschedule appt on 12/13 @0830 . Call forwarded to scheduling. Will notify MD and desk nurse.

## 2011-11-05 ENCOUNTER — Ambulatory Visit: Payer: Medicare Other | Admitting: Oncology

## 2011-11-05 ENCOUNTER — Other Ambulatory Visit: Payer: Medicare Other | Admitting: Lab

## 2011-11-23 ENCOUNTER — Other Ambulatory Visit: Payer: Self-pay | Admitting: *Deleted

## 2011-11-23 MED ORDER — FLUTICASONE PROPIONATE 50 MCG/ACT NA SUSP: 2.0000 | Freq: Every day | NASAL | Status: DC

## 2011-11-23 MED ORDER — FAMOTIDINE 20 MG PO TABS: 20.0000 mg | ORAL_TABLET | Freq: Two times a day (BID) | ORAL | Status: DC | PRN

## 2011-11-23 MED ORDER — FLUOCINONIDE 0.05 % EX CREA: TOPICAL_CREAM | Freq: Two times a day (BID) | CUTANEOUS | Status: DC

## 2011-11-23 MED ORDER — LEVOTHYROXINE SODIUM 50 MCG PO TABS: 50.0000 ug | ORAL_TABLET | Freq: Every day | ORAL | Status: DC

## 2011-11-23 MED ORDER — SIMVASTATIN 20 MG PO TABS: 20.0000 mg | ORAL_TABLET | Freq: Every day | ORAL | Status: DC

## 2011-11-23 MED ORDER — POTASSIUM CHLORIDE CRYS ER 20 MEQ PO TBCR: 20.0000 meq | EXTENDED_RELEASE_TABLET | Freq: Every day | ORAL | Status: DC

## 2011-11-23 MED ORDER — LISINOPRIL 20 MG PO TABS: 20.0000 mg | ORAL_TABLET | Freq: Every day | ORAL | Status: DC

## 2011-11-23 MED ORDER — ATENOLOL-CHLORTHALIDONE 50-25 MG PO TABS: 1.0000 | ORAL_TABLET | Freq: Every day | ORAL | Status: DC

## 2011-11-23 MED ORDER — ANASTROZOLE 1 MG PO TABS: 1.0000 mg | ORAL_TABLET | Freq: Every day | ORAL | Status: DC

## 2011-12-03 ENCOUNTER — Telehealth: Payer: Self-pay | Admitting: Pulmonary Disease

## 2011-12-03 MED ORDER — LEVOTHYROXINE SODIUM 50 MCG PO TABS: 50.0000 ug | ORAL_TABLET | Freq: Every day | ORAL | Status: DC

## 2011-12-03 NOTE — Telephone Encounter (Signed)
RX HAS BEEN SENT AND PT Alison Cordova

## 2011-12-07 ENCOUNTER — Other Ambulatory Visit: Payer: Medicare Other | Admitting: Lab

## 2011-12-07 ENCOUNTER — Ambulatory Visit: Payer: Medicare Other | Admitting: Oncology

## 2011-12-10 ENCOUNTER — Ambulatory Visit (HOSPITAL_BASED_OUTPATIENT_CLINIC_OR_DEPARTMENT_OTHER): Payer: Medicare Other | Admitting: Oncology

## 2011-12-10 ENCOUNTER — Other Ambulatory Visit (HOSPITAL_BASED_OUTPATIENT_CLINIC_OR_DEPARTMENT_OTHER): Payer: Medicare Other

## 2011-12-10 VITALS — BP 149/69 | HR 66 | Temp 97.0°F | Ht 59.0 in | Wt 113.0 lb

## 2011-12-10 DIAGNOSIS — D649 Anemia, unspecified: Secondary | ICD-10-CM

## 2011-12-10 DIAGNOSIS — C50919 Malignant neoplasm of unspecified site of unspecified female breast: Secondary | ICD-10-CM

## 2011-12-10 LAB — CBC WITH DIFFERENTIAL/PLATELET
Basophils Absolute: 0 10*3/uL (ref 0.0–0.1)
Eosinophils Absolute: 0.1 10*3/uL (ref 0.0–0.5)
HCT: 33.2 % — ABNORMAL LOW (ref 34.8–46.6)
HGB: 11 g/dL — ABNORMAL LOW (ref 11.6–15.9)
LYMPH%: 42.7 % (ref 14.0–49.7)
MONO#: 0.7 10*3/uL (ref 0.1–0.9)
NEUT#: 1.9 10*3/uL (ref 1.5–6.5)
NEUT%: 39.3 % (ref 38.4–76.8)
Platelets: 227 10*3/uL (ref 145–400)
WBC: 4.7 10*3/uL (ref 3.9–10.3)
lymph#: 2 10*3/uL (ref 0.9–3.3)

## 2011-12-10 LAB — COMPREHENSIVE METABOLIC PANEL
Albumin: 4.6 g/dL (ref 3.5–5.2)
BUN: 32 mg/dL — ABNORMAL HIGH (ref 6–23)
Calcium: 10.6 mg/dL — ABNORMAL HIGH (ref 8.4–10.5)
Chloride: 103 mEq/L (ref 96–112)
Glucose, Bld: 86 mg/dL (ref 70–99)
Potassium: 4.9 mEq/L (ref 3.5–5.3)

## 2011-12-10 NOTE — Progress Notes (Signed)
[facility] OFFICE PROGRESS NOTE  Cc:  Michele Mcalpine, MD, MD  DIAGNOSIS: History of a left breast 2.2cm, grade 2 invasive ductal carcinoma, status post simple mastectomy on September 26, 2009 without angiolymphatic invasions or perineural invasion.  Margin of the invasive component was 0.2 cm from the deep margin and in situ component of 0.5 cm from the deep margin; ER 96%, PR 100%, Ki-67 26%, HER2/neu by CISH was negative for ratio of 0.81.  One out of 3 nodes with micrometastatic disease.    CURENT THERAPY:  adjuvant hormonal therapy Arimidex started in November 2010.  INTERVAL HISTORY: Alison Cordova 78 y.o. female returns for regular follow up.  She reports doing well.  She has not had any more CVA episode since the one last year.  She has not noticed any palpable breast mass, skin thickening, erythema.  She tolerates Arimidex without hot flash, myalgia/arthralgia.    Patient denies fatigue, headache, visual changes, confusion, drenching night sweats, palpable lymph node swelling, mucositis, odynophagia, dysphagia, nausea vomiting, jaundice, chest pain, palpitation, shortness of breath, dyspnea on exertion, productive cough, gum bleeding, epistaxis, hematemesis, hemoptysis, abdominal pain, abdominal swelling, early satiety, melena, hematochezia, hematuria, skin rash, spontaneous bleeding, joint swelling, joint pain, heat or cold intolerance, bowel bladder incontinence, back pain, focal motor weakness, paresthesia, depression, suicidal or homocidal ideation, feeling hopelessness.   MEDICAL HISTORY: Past Medical History  Diagnosis Date  . Allergy     SEASONAL  . Depression   . Anxiety   . Arthritis   . Cancer     LEFT BREAST CANCER  . Cataract   . Stroke   . Glaucoma   . Hyperlipidemia   . Hypertension   . Thyroid disease     SURGICAL HISTORY:  Past Surgical History  Procedure Date  . Vaginal hysterectomy   . Spine surgery   .  fundiplication   . Eye surgery     TO  DESOLVE AN AREA IN HER LEFT EYE?  . Breast surgery     left breast mastectomy     MEDICATIONS: Current Outpatient Prescriptions  Medication Sig Dispense Refill  . acetaminophen (TYLENOL) 650 MG CR tablet Take 650 mg by mouth every 8 (eight) hours as needed.        Marland Kitchen anastrozole (ARIMIDEX) 1 MG tablet Take 1 tablet (1 mg total) by mouth daily.  90 tablet  3  . aspirin 325 MG tablet Take 325 mg by mouth daily.        Marland Kitchen atenolol-chlorthalidone (TENORETIC) 50-25 MG per tablet Take 1 tablet by mouth daily.  90 tablet  3  . Calcium Carbonate-Vitamin D (CALCIUM 600+D) 600-400 MG-UNIT per tablet Take 1 tablet by mouth 2 (two) times daily.        . cetirizine (ZYRTEC) 10 MG tablet Take 10 mg by mouth daily.        . famotidine (PEPCID) 20 MG tablet Take 1 tablet (20 mg total) by mouth 2 (two) times daily as needed.  180 tablet  3  . fish oil-omega-3 fatty acids 1000 MG capsule Take 2 g by mouth daily.        . fluocinonide cream (LIDEX) 0.05 % Apply topically 2 (two) times daily.  90 g  3  . fluticasone (FLONASE) 50 MCG/ACT nasal spray Place 2 sprays into the nose daily.  48 g  3  . fluvoxaMINE (LUVOX) 100 MG tablet Take as directed by Dr. Raquel James      . latanoprost (XALATAN) 0.005 %  ophthalmic solution Place 1 drop into both eyes at bedtime.        Marland Kitchen levothyroxine (SYNTHROID, LEVOTHROID) 50 MCG tablet Take 1 tablet (50 mcg total) by mouth daily.  90 tablet  3  . lisinopril (PRINIVIL,ZESTRIL) 20 MG tablet Take 1 tablet (20 mg total) by mouth daily.  90 tablet  3  . Multiple Vitamins-Minerals (WOMENS MULTIVITAMIN PLUS PO) Take 1 tablet by mouth daily.        . nortriptyline (PAMELOR) 10 MG capsule Take 10 mg by mouth at bedtime.        . potassium chloride SA (K-DUR,KLOR-CON) 20 MEQ tablet Take 1 tablet (20 mEq total) by mouth daily.  90 tablet  3  . simvastatin (ZOCOR) 20 MG tablet Take 1 tablet (20 mg total) by mouth at bedtime.  90 tablet  3    ALLERGIES:   has no known allergies.  REVIEW OF  SYSTEMS:  The rest of the 14-point review of system was negative.   Filed Vitals:   12/10/11 1028  BP: 149/69  Pulse: 66  Temp: 97 F (36.1 C)   Wt Readings from Last 3 Encounters:  12/10/11 113 lb (51.256 kg)  07/22/11 114 lb 4.8 oz (51.846 kg)  10/26/11 115 lb 2 oz (52.22 kg)   ECOG Performance status: 0-1  PHYSICAL EXAMINATION:  General:  Thin-appearing woman in no acute distress.  Eyes:  no scleral icterus.  ENT:  There were no oropharyngeal lesions.  Neck was without thyromegaly.  Lymphatics:  Negative cervical, supraclavicular or axillary adenopathy.  Respiratory: lungs were clear bilaterally without wheezing or crackles.  Cardiovascular:  Regular rate and rhythm, S1/S2, without murmur, rub or gallop.  There was no pedal edema.  GI:  abdomen was soft, flat, nontender, nondistended, without organomegaly.  Muscoloskeletal:  no spinal tenderness of palpation of vertebral spine.  Skin exam was without echymosis, petichae.  Neuro exam was nonfocal.  Patient was able to get on and off exam table without assistance.  Gait was normal.  Patient was alerted and oriented.  Attention was good.   Language was appropriate.  Mood was normal without depression.  Speech was not pressured.  Thought content was not tangential.  Bilateral breast exam showed left mastectomy scar without nodule, thickening.  Right breast was negative.    LABORATORY/RADIOLOGY DATA:  Lab Results  Component Value Date   WBC 4.7 12/10/2011   HGB 11.0* 12/10/2011   HCT 33.2* 12/10/2011   PLT 227 12/10/2011   GLUCOSE 91 07/22/2011   GLUCOSE 91 07/22/2011   CHOL 217* 12/25/2010   TRIG 163.0* 12/25/2010   HDL 67.60 12/25/2010   LDLDIRECT 127.9 12/25/2010   LDLCALC  Value: 83        Total Cholesterol/HDL:CHD Risk Coronary Heart Disease Risk Table                     Men   Women  1/2 Average Risk   3.4   3.3  Average Risk       5.0   4.4  2 X Average Risk   9.6   7.1  3 X Average Risk  23.4   11.0        Use the calculated Patient Ratio  above and the CHD Risk Table to determine the patient's CHD Risk.        ATP III CLASSIFICATION (LDL):  <100     mg/dL   Optimal  657-846  mg/dL   Near or Above  Optimal  130-159  mg/dL   Borderline  161-096  mg/dL   High  >045     mg/dL   Very High 03/01/8118   ALT 13 07/22/2011   ALT 13 07/22/2011   AST 18 07/22/2011   AST 18 07/22/2011   NA 141 07/22/2011   NA 141 07/22/2011   K 4.3 07/22/2011   K 4.3 07/22/2011   CL 104 07/22/2011   CL 104 07/22/2011   CREATININE 1.66* 07/22/2011   CREATININE 1.66* 07/22/2011   BUN 29* 07/22/2011   BUN 29* 07/22/2011   CO2 28 07/22/2011   CO2 28 07/22/2011   TSH 1.89 12/25/2010   INR 0.98 06/08/2010   HGBA1C  Value: 5.6 (NOTE)                                                                       According to the ADA Clinical Practice Recommendations for 2011, when HbA1c is used as a screening test:   >=6.5%   Diagnostic of Diabetes Mellitus           (if abnormal result  is confirmed)  5.7-6.4%   Increased risk of developing Diabetes Mellitus  References:Diagnosis and Classification of Diabetes Mellitus,Diabetes Care,2011,34(Suppl 1):S62-S69 and Standards of Medical Care in         Diabetes - 2011,Diabetes Care,2011,34  (Suppl 1):S11-S61. 06/08/2010     ASSESSMENT AND PLAN:   1. History of breast cancer.  I discussed with Ms. Umland that she has no evidence of disease recurrence or metastatic disease on clinical history, physical exam, laboratory tests.  She had a mammogram done with [facility] in October 2012 which was negative.  Next mammogram is due in October 2013.  Bone density in 07/2011 did not showed osteoporosis.  I encouraged her to continue with Cal/Vitamin D prophylaxis.  2. Hypertension.  She is on atenolol, lisinopril per PCP. 3. Hyperlipidemia.  She is on simvastatin per PCP. 4. History of CVA.  She is on aspirin, simvastatin and beta blocker per PCP. 5. Hypothyroidism.  She is on levothyroxine per PCP. 6. Renal insufficiency:  most likely due  to chronic HTN vs. slight elevation of Cr from Lisinopril.   I appreciate follow up by PCP.  7. Slight anemia:  Most likely due to anemia from chronic renal insufficiency.  There is no active bleeding.  There is no need for transfusion. Past fecal occult was negative. I may consider further work up if her anemia significantly worsens.  8. Followup.  With me in about 6 months.   The length of time of the face-to-face encounter was 15 minutes. More than 50% of time was spent counseling and coordination of care.

## 2011-12-28 NOTE — Progress Notes (Signed)
Faxed most recent office notes and labs to Dr. Alroy Dust @ [facility] [phone]), per patient's request.

## 2012-01-04 ENCOUNTER — Ambulatory Visit (INDEPENDENT_AMBULATORY_CARE_PROVIDER_SITE_OTHER): Payer: Medicare Other | Admitting: Pulmonary Disease

## 2012-01-04 ENCOUNTER — Other Ambulatory Visit (INDEPENDENT_AMBULATORY_CARE_PROVIDER_SITE_OTHER): Payer: Medicare Other

## 2012-01-04 ENCOUNTER — Encounter: Payer: Self-pay | Admitting: Pulmonary Disease

## 2012-01-04 DIAGNOSIS — J42 Unspecified chronic bronchitis: Secondary | ICD-10-CM

## 2012-01-04 DIAGNOSIS — M159 Polyosteoarthritis, unspecified: Secondary | ICD-10-CM

## 2012-01-04 DIAGNOSIS — E7800 Pure hypercholesterolemia, unspecified: Secondary | ICD-10-CM

## 2012-01-04 DIAGNOSIS — E039 Hypothyroidism, unspecified: Secondary | ICD-10-CM

## 2012-01-04 DIAGNOSIS — I10 Essential (primary) hypertension: Secondary | ICD-10-CM

## 2012-01-04 DIAGNOSIS — K219 Gastro-esophageal reflux disease without esophagitis: Secondary | ICD-10-CM

## 2012-01-04 DIAGNOSIS — F341 Dysthymic disorder: Secondary | ICD-10-CM

## 2012-01-04 DIAGNOSIS — K5730 Diverticulosis of large intestine without perforation or abscess without bleeding: Secondary | ICD-10-CM

## 2012-01-04 DIAGNOSIS — R51 Headache: Secondary | ICD-10-CM

## 2012-01-04 DIAGNOSIS — I679 Cerebrovascular disease, unspecified: Secondary | ICD-10-CM

## 2012-01-04 DIAGNOSIS — K589 Irritable bowel syndrome without diarrhea: Secondary | ICD-10-CM

## 2012-01-04 DIAGNOSIS — C50919 Malignant neoplasm of unspecified site of unspecified female breast: Secondary | ICD-10-CM

## 2012-01-04 LAB — LIPID PANEL
HDL: 70.2 mg/dL (ref >39.00)
Triglycerides: 115 mg/dL (ref 0.0–149.0)

## 2012-01-04 NOTE — Patient Instructions (Signed)
Today we updated your med list in our EPIC system...    Continue your current medications the same...  We reviewed your recent labs by [facility]... Today we did your follow up fasting blood work...    Please call the [facility]REE in a few days for your results...    Dial [id number] & when prompted enter your patient number followed by the # symbol...    Your patient number is:  [id number]#  Remember to take a 1-a-day w/ Iron multivit...  Stay as active as possible...  We gave you the requested handicap sticker...  Call for any questions...  Let's plan a follow up visit in  6 months.Marland KitchenMarland Kitchen

## 2012-01-04 NOTE — Progress Notes (Signed)
Subjective:    Patient ID: Alison Cordova, female    DOB: Oct 24, 1926, 78 y.o.   MRN: [id number]  HPI 78 y/o WF here for a follow up visit... she has mult med problems as noted below... she is HOH and had a cochlear implant yrs ago... she is 78 y/o and rather frail (lives w/ her daughter & her family)...  ~  February 19, 2010:  after she was here last she had an abn mammogram and surg 11/10 by [facility]-  left total mastectomy & sentinel node bx= 2.2cm invasive ductal carcinoma & 1/3 nodes pos for micromets... oncology per [facility] on aromatase inhibitor ARIMIDEX 1mg /d... last seen 01/17/10 f/u & note reviewed... she continues to see DrSanders for Ophthalmology w/ series of AVASTIN shots that seem to be helping according to the pt....  BP controlled on meds, due for CXR (NAD- RML scarring, left mastectomy, DJD sp) & fasting labs (all essent WNL- creat=1.4)... OK TDAP today.  ~  June 26, 2010:  Hosp 7/17-19 w/ TIA (right facial numbness & tingling which resolved spont)... MRI w/o acute change- atrophy, sm vessel dis, scat lacunes, CSpine dis;  MRA showed mild to mod atherosclerotic changes, 50% left vertebral stenosis, ?3mm aneurysmal dil right paraophthalmic art... ONLY MED CHANGE WAS PLAVIX SWITCHED TO "POINT STUDY" DRUG (Placebo vs Plavix)> followed by DrSethi... BP controlled on meds;  Chol looks good on Simva20;  Thyroid regulated w/ Levothy50;  Prilosec changed to PEPCID 20mg /d...   ~  December 25, 2010:  77mo ROV- doing satis overall> f/u labs are satis x FLP not at goal on Simva20 & reminded re: low chol/ low fat diet... she states stools were pos for blood per GYN & needs f/u DrDBrodie (stool cards here are neg)...    She tells me that DrSethi switched her from ASA + study drug to Aggrenox Rx but she is upset w/ the cost factor & will discuss alternatives w/ Neurology...    She had Oncology f/u [facility] 12/11> doing satis on Anastozole1mg /d, no changes made... she also saw her surgeon, DrIngram 11/11 &  similarly stable, doing well, no changes.  ~  July 03, 2011:  77mo ROV & she reports stable; we reviewed meds but she didn't bring her bottles> she notes that DrSethi changed her Aggrenox (too $$) to a coated ASA 325mg /d & she's been stable w/o cerebral ischemic symptoms;  She has hx recurrent bronchitic infections in the past but no recent problems;  BP controlled on Aten/Hct;  Chol stable on Simva20 but needs better diet;  Etc...    She saw DrDBrodie 3/12 for GI eval due to heme pos stools> Colonoscopy showed Hems otherw neg, & rec hi fiber diet; She also has hx remote Nissen & EGD showed mild gastitis- REC to take Prilosec vs Pepcid...     She also saw DrHa for Oncology f/u 4/12 & stable on Arimidex...    CXR today shows ?12mm nodule right lung base> ?assoc w/ ant tip of rib? We will sched CT Chest ==> CT 8/12 showed min patchy atx, NO NODULE seen on CT, no adenopathy, s/p left mastectomy, DJD spine...  ~  January 04, 2012:  77mo ROV & she has mult somatic complaints> tired, back pain, HAs, sl dizzy on occas, wants handicap sticker- ok; offered pain meds etc but she declines "I use Tylenol"    She saw DrHa 1/13 for f/u breast cancer on Arimidex since 11/10> note reviewed: +invasion & 1/3 nodes; neg mammogram 10/12  at [facility]; BMD 9/12 here was neg w/o osteoporosis; no changes made...    She saw DrHIngram 12/12 for surg f/u & he released her to [facility] care, stable, no known recurrence, f/u prn...    We reviewed lab summary & prev Imaging> labs done 1/13 showed Creat 1.7, Hg 11.0;  She had BMD 9/12 w/ TScores +6.4 in Spine and -0.7 in left FemNeck...          Problem List:  GLAUCOMA (ICD-365.9) - followed by DrHollander on gtts... ~  5/10: sent to retina spec & saw DrSanders 5/10 w/ macular edema & hemorrhage- s/p laser Rx x2. ~  subseq started on series of AVASTIN shots in the eyes that seem to be helping.  ALLERGIC RHINITIS (ICD-477.9) - on OTC Zyretek, & FLONASE...  BRONCHITIS, RECURRENT  (ICD-491.9) - she is an ex-smoker... she had hx of LLL pneumonia in 2000... she denies cough, sputum, change in SOB, etc... ~  CXR 3/11 showed RML scarring, otherw clear lungs, DJD sp, left mastectomy... ~  CXR 8/12 showed ?12mm RLL nodule (near an ant rib tip) ==> CTChest 8/12 confirmed no lung nodule present, no adenopathy, calcif Ao arch, left mastectomy.  HYPERTENSION (ICD-401.9) - on ATENOLOL/Hct 50-25 daily + K20/d & LISINOPRIL 20mg /d...   ~  8/12: BP=136/64 today and similar on home checks... she denies CP, palpit, change in SOB...  ~  2/13: BP= 114/64 & she remains asymptomatic...  CEREBROVASCULAR DISEASE (ICD-437.9) - Hosp 7/11 w/ TIA (right facial numbness & tingling which resolved spont)...  ~  MRI 7/11w/o acute change- atrophy, sm vessel dis, scat lacunes, CSpine dis;  MRA showed mild to mod atherosclerotic changes, 50% left vertebral stenosis, ?3mm aneurysmal dil right paraophthalmic art... followed by DrSethi> initially on study drug, then switched to AGGRENOX Bid, but she refused due to cost, then changed to ASA 325mg /d.  HYPERCHOLESTEROLEMIA (ICD-272.0) - on SIMVASTATIN 20mg /d + Fish Oil daily... ~  FLP 4/08 on Simva20 showed TChol 178, TG 171, HDL 71, LDL 73 ~  FLP 4/09 showed TChol 222, TG 112, HDL 109, LDL 95... continue same- GREAT HDL!!! ~  FLP 3/10 on Simva20 showed TChol 179, TG 176, HDL 81, LDL 63... continue same. ~  FLP 9/10 on Simva 20 showed TChol 182, TG 174, HDL 77, LDL 70 ~  FLP 3/11 on Simva20 showed TChol 199, TG 139, HDL 88, LDL 84 ~  FLP 7/11 in hosp on Simva20 showed TChol 180, TG 186, HDL 60, LDL 83 ~  FLP 2/12 on Simva20 showed TChol 217, TG 163, HDL 68, LDL 128... reminded about diet + med. ~  FLP 2/13 on Simva20 showed TChol 205, TG 115, HDL 70, LDL 116  HYPOTHYROIDISM (ICD-244.9) - on SYNTHROID 49mcg/d... ~  labs 4/08 showed TSH = 0.33 ~  labs 4/09 showed TSH= 1.33 ~  labs 3/10 showed TSH= 1.42 ~  labs 3/11 showed TSH= 1.59 ~  labs 7/11 in hosp  showed TSH= 1.63 ~  labs 2/12 on Synthroid50 showed TSH= 1.89 ~  Labs 2/13 on Synthroid50 showed TSH= 1.25  GERD (ICD-530.81) - she has noted some GERD symptoms: rec> Pepcid 20mg  OTC daily but she uses Prn. ~  EGD 3/12 by DrDBrodie showed evid of prev fundoplication, mild gastitis... DIVERTICULOSIS OF COLON (ICD-562.10) -  ~  Colonoscopy 11/06 by DrBrodie was normal, no change from 2000... IRRITABLE BOWEL SYNDROME (ICD-564.1) HEMORRHOIDS >> heme pos stool 3/12 was evaluated by DrDBrodie & determined to be due to Hems... ~  Colonoscopy 3/12  was neg x internal hemorrhoids  Hx of URINARY INCONTINENCE (ICD-788.30) - she saw DrPeterson and had urodynamic studies in 2008... she wears pads. RENAL INSUFFICIENCY >> ~  Labs 4/12 showed BUN=30, Creat=1.53 ~  Labs 8/12 showed BUN=29, Creat-1.66 ~  Labs 1/13 showed BUN=32, Creat=1.71, reminded to stay well hydrated.  ADENOCARCINOMA, LEFT BREAST (ICD-174.9) - diagnosed w/ left breast adenocarcinoma 11/10 w/ total left mastectomy, sentinel node bx (micro mets in 1/3 nodes), & adjuvant hormonal therapy w/ ANASTROZOLE 1mg /d followed by Lenis Noon since 11/10. ~  She saw DrHIngram 12/12 for surg f/u & he released her to [facility] care, stable, no known recurrence, f/u prn... ~  She saw DrHa 1/13 for f/u breast cancer on Arimidex since 11/10> note reviewed +invasion & 1/3 nodes; neg mammogram 10/12 at [facility]; BMD 9/12 here was neg w/o osteoporosis; no changes made...  DEGENERATIVE JOINT DISEASE, GENERALIZED >> Aware, she declines offeres for pain meds & prefers to use Tylenol prn... ~  BMD here 9/12 showed TScores +6.4 in Spine and -0.7 in left FemNeck;  rec Calcium, MVI, VitD...  CVA (ICD-434.91) - on ASA 325mg /d and then POINT Study drug (Placebo vs Plavix)> then AGGRENOX Bid per neuro but she couldn't afford & switched to ASA 325mg /d. ~  she was seen by Autumn Patty in 2001 w/ studies showing bilat sm vessel ischemic strokes... ~  [facility] 7/11 w/ TIA << SEE ABOVE  >>  HEADACHE (ICD-784.0) Hx of PERIPHERAL NEUROPATHY (ICD-356.9) - takes NORTRIPTYLINE 10mg - 2tabsQhs (per Psyche)... c/o her feet bothering her at night...   DYSTHYMIA (ICD-300.4) - on LUVOX + NORTRIPTYLINE per Psychiatry, DrPamPittman... incr stress- 56y/o son w/ MI...  ANEMIA (ICD-285.9) ~  abs 4/09 showed Hg= 13.7 ~  labs 3/11 showed Hg= 12.6 ~  labs 7/11 in hosp showed Hg= 12.6 ~  labs 2/12 showed Hg= 12.6 ~  Labs 1/13 showed Hg= 11.0, MCV= 97, & she was rec to take 1-a-day w/ Fe...  Health Maintenance - GYN = prev DrMcPail, but saw new GYN 2011 & told no further evals needed... Mammograms at [facility] (Mammogram 10/10 w/ left breast mass- referred to [facility] & Bx pos for infiltrating ductal cancer)...  BMD here 9/10 showed TScores +5.3 in spine & -0.2 in [facility]... Vit D level 4/09 was 37 & Rx w/ OTC 1000 u daily... she gets yearly Flu shots;  had Pneumovax 51yrs ago;  given TDAP 3/11.   Past Surgical History  Procedure Date  . Vaginal hysterectomy   . Spine surgery   .  fundiplication   . Eye surgery     TO DESOLVE AN AREA IN HER LEFT EYE?  . Breast surgery     left breast mastectomy     Outpatient Encounter Prescriptions as of 01/04/2012  Medication Sig Dispense Refill  . acetaminophen (TYLENOL) 650 MG CR tablet Take 650 mg by mouth every 8 (eight) hours as needed.        Marland Kitchen anastrozole (ARIMIDEX) 1 MG tablet Take 1 tablet (1 mg total) by mouth daily.  90 tablet  3  . aspirin 325 MG tablet Take 325 mg by mouth daily.        Marland Kitchen atenolol-chlorthalidone (TENORETIC) 50-25 MG per tablet Take 1 tablet by mouth daily.  90 tablet  3  . Calcium Carbonate-Vitamin D (CALCIUM 600+D) 600-400 MG-UNIT per tablet Take 1 tablet by mouth 2 (two) times daily.        . cetirizine (ZYRTEC) 10 MG tablet Take 10 mg by mouth daily.        Marland Kitchen  famotidine (PEPCID) 20 MG tablet Take 1 tablet (20 mg total) by mouth 2 (two) times daily as needed.  180 tablet  3  . fish oil-omega-3 fatty acids 1000 MG  capsule Take 2 g by mouth daily.        . fluocinonide cream (LIDEX) 0.05 % Apply topically 2 (two) times daily.  90 g  3  . fluticasone (FLONASE) 50 MCG/ACT nasal spray Place 2 sprays into the nose daily.  48 g  3  . fluvoxaMINE (LUVOX) 100 MG tablet Take as directed by Dr. Raquel James      . latanoprost (XALATAN) 0.005 % ophthalmic solution Place 1 drop into both eyes at bedtime.        Marland Kitchen levothyroxine (SYNTHROID, LEVOTHROID) 50 MCG tablet Take 1 tablet (50 mcg total) by mouth daily.  90 tablet  3  . lisinopril (PRINIVIL,ZESTRIL) 20 MG tablet Take 1 tablet (20 mg total) by mouth daily.  90 tablet  3  . Multiple Vitamins-Minerals (WOMENS MULTIVITAMIN PLUS PO) Take 1 tablet by mouth daily.        . nortriptyline (PAMELOR) 10 MG capsule Take 10 mg by mouth at bedtime.        . potassium chloride SA (K-DUR,KLOR-CON) 20 MEQ tablet Take 1 tablet (20 mEq total) by mouth daily.  90 tablet  3  . simvastatin (ZOCOR) 20 MG tablet Take 1 tablet (20 mg total) by mouth at bedtime.  90 tablet  3  . timolol (BETIMOL) 0.5 % ophthalmic solution Place 1 drop into both eyes 2 (two) times daily.        No Known Allergies   Current Medications, Allergies, Past Medical History, Past Surgical History, Family History, and Social History were reviewed in [employer] record.    Review of Systems       See HPI - all other systems neg except as noted...  The patient complains of dyspnea on exertion.  The patient denies anorexia, fever, weight loss, weight gain, vision loss, decreased hearing, hoarseness, chest pain, syncope, peripheral edema, prolonged cough, headaches, hemoptysis, abdominal pain, melena, hematochezia, severe indigestion/heartburn, hematuria, incontinence, muscle weakness, suspicious skin lesions, transient blindness, difficulty walking, depression, unusual weight change, abnormal bleeding, enlarged lymph nodes, and angioedema.     Objective:   Physical Exam     WD, Thin, 78 y/o  WF in NAD... she is chr ill appearing...  GENERAL:  Alert & oriented; pleasant & cooperative... HEENT:  [place]/AT, EACs-clear, TMs-wnl, Cochlear implant, NOSE-clear, THROAT-clear & wnl. NECK:  Supple w/ fairROM; no JVD; normal carotid impulses w/o bruits; no thyromegaly or nodules palpated; no lymphadenopathy. CHEST:  s/p left mastectomy, clear lungs w/o wheezing, rales, or rhonchi... HEART:  Regular Rhythm;  g 1/6 SEM without rubs or gallops... ABDOMEN:  Soft & nontender; normal bowel sounds; no organomegaly or masses detected. EXT: without deformities, mod arthritic changes; no varicose veins/ +venous insuffic/ no edema.  NEURO:  CN's intact;  no asymmetry;  no focal neuro deficits... DERM:  No lesions noted; no rash etc...  RADIOLOGY DATA:  Reviewed in the EPIC EMR & discussed w/ the patient> see above...  LABORATORY DATA:  Reviewed in the EPIC EMR & discussed w/ the patient> see above...   Assessment & Plan:   Hx recurrent bronchitis/ no RLL nodule on CT>  CXR 8/12 suggested poss RLL nodule, but CT confirmed it was just the ant portion of right 6th rib (costochondral junction); no other lesions identified on CT...  Breathing well, no  recurrent bronchitic episode, etc...  HBP>  Controlled on meds, continue same...  CEREBROVASC Dis/ TIA>  As noted she is currently taking ASA 325mg /d & has not had any cerebral ischemic symptoms.  CHOL>  On Simva20 + diet & reminded to take med regularly...  HYPOTHYROID>  Stable on Synthroid 38mcg/d...  GI>  Gastitis, Hx NissenFundoplication, Divertics, IBS, Hems>  3/12 GI eval DrDBrodie reviewed- BRB in stool likely from [facility] & rx by GI...  Breast Cancer>  Followed by Lenis Noon on Armidex & stable...  DJD>  She uses OTC anti-inflamm as needed...  DYSTHYMIA>  Followed by DrPPittman on Luvox & Nortriptyline...   Patient's Medications  New Prescriptions   No medications on file  Previous Medications   ACETAMINOPHEN (TYLENOL) 650 MG CR TABLET    Take 650  mg by mouth every 8 (eight) hours as needed.     ANASTROZOLE (ARIMIDEX) 1 MG TABLET    Take 1 tablet (1 mg total) by mouth daily.   ASPIRIN 325 MG TABLET    Take 325 mg by mouth daily.     ATENOLOL-CHLORTHALIDONE (TENORETIC) 50-25 MG PER TABLET    Take 1 tablet by mouth daily.   CALCIUM CARBONATE-VITAMIN D (CALCIUM 600+D) 600-400 MG-UNIT PER TABLET    Take 1 tablet by mouth 2 (two) times daily.     CETIRIZINE (ZYRTEC) 10 MG TABLET    Take 10 mg by mouth daily.     FAMOTIDINE (PEPCID) 20 MG TABLET    Take 1 tablet (20 mg total) by mouth 2 (two) times daily as needed.   FISH OIL-OMEGA-3 FATTY ACIDS 1000 MG CAPSULE    Take 2 g by mouth daily.     FLUOCINONIDE CREAM (LIDEX) 0.05 %    Apply topically 2 (two) times daily.   FLUTICASONE (FLONASE) 50 MCG/ACT NASAL SPRAY    Place 2 sprays into the nose daily.   FLUVOXAMINE (LUVOX) 100 MG TABLET    Take as directed by Dr. Raquel James   LATANOPROST (XALATAN) 0.005 % OPHTHALMIC SOLUTION    Place 1 drop into both eyes at bedtime.     LEVOTHYROXINE (SYNTHROID, LEVOTHROID) 50 MCG TABLET    Take 1 tablet (50 mcg total) by mouth daily.   LISINOPRIL (PRINIVIL,ZESTRIL) 20 MG TABLET    Take 1 tablet (20 mg total) by mouth daily.   MULTIPLE VITAMINS-MINERALS (WOMENS MULTIVITAMIN PLUS PO)    Take 1 tablet by mouth daily.     NORTRIPTYLINE (PAMELOR) 10 MG CAPSULE    Take 10 mg by mouth at bedtime.     POTASSIUM CHLORIDE SA (K-DUR,KLOR-CON) 20 MEQ TABLET    Take 1 tablet (20 mEq total) by mouth daily.   SIMVASTATIN (ZOCOR) 20 MG TABLET    Take 1 tablet (20 mg total) by mouth at bedtime.   TIMOLOL (BETIMOL) 0.5 % OPHTHALMIC SOLUTION    Place 1 drop into both eyes 2 (two) times daily.  Modified Medications   No medications on file  Discontinued Medications   No medications on file

## 2012-01-05 ENCOUNTER — Encounter: Payer: Self-pay | Admitting: Pulmonary Disease

## 2012-06-17 ENCOUNTER — Ambulatory Visit (HOSPITAL_BASED_OUTPATIENT_CLINIC_OR_DEPARTMENT_OTHER): Payer: Medicare Other | Admitting: Oncology

## 2012-06-17 ENCOUNTER — Other Ambulatory Visit (HOSPITAL_BASED_OUTPATIENT_CLINIC_OR_DEPARTMENT_OTHER): Payer: Medicare Other | Admitting: Lab

## 2012-06-17 ENCOUNTER — Other Ambulatory Visit: Payer: Self-pay | Admitting: Oncology

## 2012-06-17 ENCOUNTER — Encounter: Payer: Self-pay | Admitting: Oncology

## 2012-06-17 ENCOUNTER — Telehealth: Payer: Self-pay | Admitting: Oncology

## 2012-06-17 VITALS — BP 184/78 | HR 73 | Temp 97.0°F | Ht <= 58 in | Wt 116.5 lb

## 2012-06-17 DIAGNOSIS — N289 Disorder of kidney and ureter, unspecified: Secondary | ICD-10-CM

## 2012-06-17 DIAGNOSIS — D649 Anemia, unspecified: Secondary | ICD-10-CM | POA: Insufficient documentation

## 2012-06-17 DIAGNOSIS — I10 Essential (primary) hypertension: Secondary | ICD-10-CM

## 2012-06-17 DIAGNOSIS — C50919 Malignant neoplasm of unspecified site of unspecified female breast: Secondary | ICD-10-CM

## 2012-06-17 DIAGNOSIS — N182 Chronic kidney disease, stage 2 (mild): Secondary | ICD-10-CM | POA: Insufficient documentation

## 2012-06-17 DIAGNOSIS — N183 Chronic kidney disease, stage 3 unspecified: Secondary | ICD-10-CM | POA: Insufficient documentation

## 2012-06-17 DIAGNOSIS — H269 Unspecified cataract: Secondary | ICD-10-CM

## 2012-06-17 HISTORY — DX: Unspecified cataract: H26.9

## 2012-06-17 HISTORY — DX: Anemia, unspecified: D64.9

## 2012-06-17 LAB — COMPREHENSIVE METABOLIC PANEL
AST: 17 U/L (ref 0–37)
Alkaline Phosphatase: 40 U/L (ref 39–117)
BUN: 21 mg/dL (ref 6–23)
Calcium: 9.9 mg/dL (ref 8.4–10.5)
Chloride: 107 mEq/L (ref 96–112)
Creatinine, Ser: 1.46 mg/dL — ABNORMAL HIGH (ref 0.50–1.10)

## 2012-06-17 LAB — CBC WITH DIFFERENTIAL/PLATELET
Basophils Absolute: 0 10*3/uL (ref 0.0–0.1)
EOS%: 1.5 % (ref 0.0–7.0)
HCT: 30.8 % — ABNORMAL LOW (ref 34.8–46.6)
HGB: 10.5 g/dL — ABNORMAL LOW (ref 11.6–15.9)
LYMPH%: 37.3 % (ref 14.0–49.7)
MCH: 33.1 pg (ref 25.1–34.0)
MCV: 96.9 fL (ref 79.5–101.0)
MONO%: 17.7 % — ABNORMAL HIGH (ref 0.0–14.0)
NEUT%: 43 % (ref 38.4–76.8)
Platelets: 197 10*3/uL (ref 145–400)

## 2012-06-17 NOTE — Telephone Encounter (Signed)
Gave pt appt calendar  for October  2013 lab draw, then January 2014 lab and ML

## 2012-06-17 NOTE — Progress Notes (Signed)
[facility]  [phone] [phone]   OFFICE PROGRESS NOTE   Cc:  Michele Mcalpine, MD  DIAGNOSIS AND PAST THERAPY:   History of a left breast 2.2cm, grade 2 invasive ductal carcinoma, status post simple mastectomy on September 26, 2009 without angiolymphatic invasions or perineural invasion. Margin of the invasive component was 0.2 cm from the deep margin and in situ component of 0.5 cm from the deep margin; ER 96%, PR 100%, Ki-67 26%, HER2/neu by CISH was negative for ratio of 0.81. One out of 3 nodes with micrometastatic disease.   CURENT THERAPY: adjuvant hormonal therapy Arimidex started in November 2010.  INTERVAL HISTORY: Alison Cordova 78 y.o. female returns for regular follow up by herself.  She reports feeling relatively well from her breast cancer treatment.  She denied bone/muscle pain from Arimidex.  She denied breast abnormality.  She however has had urinary stress incontinence and has had to wear diaper.  She has mild nonproductive cough the last few weeks without fever, hemoptysis, pleurisy, chest pain.  She has mild fatigue; however, she is independent of personal hygiene activities.  She does not do much household chore anymore.   Patient denies anorexia, weight loss, headache, visual changes, confusion, drenching night sweats, palpable lymph node swelling, mucositis, odynophagia, dysphagia, nausea vomiting, jaundice, chest pain, palpitation, gum bleeding, epistaxis, hematemesis, abdominal pain, abdominal swelling, early satiety, melena, hematochezia, hematuria, skin rash, spontaneous bleeding, joint swelling, joint pain, heat or cold intolerance, back pain, focal motor weakness, paresthesia, depression, suicidal or homocidal ideation, feeling hopelessness.   Past Medical History  Diagnosis Date  . Allergy     SEASONAL  . Depression   . Anxiety   . Arthritis   . Cancer     LEFT BREAST CANCER  . Cataract   . Stroke   . Glaucoma   .  Hyperlipidemia   . Hypertension   . Thyroid disease   . Anemia 06/17/2012  . Chronic kidney disease (CKD), stage II (mild) 06/17/2012    Past Surgical History  Procedure Date  . Vaginal hysterectomy   . Spine surgery   .  fundiplication   . Eye surgery     TO DESOLVE AN AREA IN HER LEFT EYE?  . Breast surgery     left breast mastectomy     Current Outpatient Prescriptions  Medication Sig Dispense Refill  . acetaminophen (TYLENOL) 650 MG CR tablet Take 650 mg by mouth every 8 (eight) hours as needed.        Marland Kitchen anastrozole (ARIMIDEX) 1 MG tablet Take 1 tablet (1 mg total) by mouth daily.  90 tablet  3  . aspirin 325 MG tablet Take 325 mg by mouth daily.        Marland Kitchen atenolol-chlorthalidone (TENORETIC) 50-25 MG per tablet Take 1 tablet by mouth daily.  90 tablet  3  . Calcium Carbonate-Vitamin D (CALCIUM 600+D) 600-400 MG-UNIT per tablet Take 1 tablet by mouth 2 (two) times daily.        . cetirizine (ZYRTEC) 10 MG tablet Take 10 mg by mouth daily.        . famotidine (PEPCID) 20 MG tablet Take 1 tablet (20 mg total) by mouth 2 (two) times daily as needed.  180 tablet  3  . fish oil-omega-3 fatty acids 1000 MG capsule Take 2 g by mouth daily.        . fluocinonide cream (LIDEX) 0.05 % Apply topically 2 (two) times daily.  90 g  3  . fluticasone (FLONASE) 50 MCG/ACT nasal spray Place 2 sprays into the nose daily.  48 g  3  . fluvoxaMINE (LUVOX) 100 MG tablet Take as directed by Dr. Raquel James      . latanoprost (XALATAN) 0.005 % ophthalmic solution Place 1 drop into both eyes at bedtime.        Marland Kitchen levothyroxine (SYNTHROID, LEVOTHROID) 50 MCG tablet Take 1 tablet (50 mcg total) by mouth daily.  90 tablet  3  . lisinopril (PRINIVIL,ZESTRIL) 20 MG tablet Take 1 tablet (20 mg total) by mouth daily.  90 tablet  3  . Multiple Vitamins-Minerals (WOMENS MULTIVITAMIN PLUS PO) Take 1 tablet by mouth daily.        . nortriptyline (PAMELOR) 10 MG capsule Take 10 mg by mouth at bedtime.        . potassium  chloride SA (K-DUR,KLOR-CON) 20 MEQ tablet Take 1 tablet (20 mEq total) by mouth daily.  90 tablet  3  . simvastatin (ZOCOR) 20 MG tablet Take 1 tablet (20 mg total) by mouth at bedtime.  90 tablet  3  . timolol (BETIMOL) 0.5 % ophthalmic solution Place 1 drop into both eyes 2 (two) times daily.        ALLERGIES:   has no known allergies.  REVIEW OF SYSTEMS:  The rest of the 14-point review of system was negative.   Filed Vitals:   06/17/12 0851  BP: 184/78  Pulse: 73  Temp: 97 F (36.1 C)   Wt Readings from Last 3 Encounters:  06/17/12 116 lb 8 oz (52.844 kg)  01/04/12 112 lb 3.2 oz (50.894 kg)  12/10/11 113 lb (51.256 kg)   ECOG Performance status: 1-2  PHYSICAL EXAMINATION:   General: Thin and frail appearing woman in no acute distress. Eyes: no scleral icterus. ENT: There were no oropharyngeal lesions. Neck was without thyromegaly. Lymphatics: Negative cervical, supraclavicular or axillary adenopathy. Respiratory: lungs were clear bilaterally without wheezing or crackles. Cardiovascular: Regular rate and rhythm, S1/S2, without murmur, rub or gallop. There was no pedal edema. GI: abdomen was soft, flat, nontender, nondistended, without organomegaly.  She deferred rectal exam due to colonoscopy and EGD in 2012 which were both negative.  Muscoloskeletal: no spinal tenderness of palpation of vertebral spine. Skin exam was without echymosis, petichae. Neuro exam was nonfocal. Patient was able to get on and off exam table without assistance. Gait was normal. Patient was alerted and oriented. Attention was good. Language was appropriate. Mood was normal without depression. Speech was not pressured. Thought content was not tangential. Bilateral breast exam showed left mastectomy scar without nodule, thickening. Right breast was negative.     LABORATORY/RADIOLOGY DATA:  Lab Results  Component Value Date   WBC 4.2 06/17/2012   HGB 10.5* 06/17/2012   HCT 30.8* 06/17/2012   PLT 197 06/17/2012    GLUCOSE 86 12/10/2011   CHOL 205* 01/04/2012   TRIG 115.0 01/04/2012   HDL 70.20 01/04/2012   LDLDIRECT 115.9 01/04/2012   LDLCALC  Value: 83        Total Cholesterol/HDL:CHD Risk Coronary Heart Disease Risk Table                     Men   Women  1/2 Average Risk   3.4   3.3  Average Risk       5.0   4.4  2 X Average Risk   9.6   7.1  3 X Average Risk  23.4   11.0  Use the calculated Patient Ratio above and the CHD Risk Table to determine the patient's CHD Risk.        ATP III CLASSIFICATION (LDL):  <100     mg/dL   Optimal  295-621  mg/dL   Near or Above                    Optimal  130-159  mg/dL   Borderline  308-657  mg/dL   High  >846     mg/dL   Very High 9/62/9528   ALKPHOS 34* 12/10/2011   ALT 9 12/10/2011   AST 17 12/10/2011   NA 142 12/10/2011   K 4.9 12/10/2011   CL 103 12/10/2011   CREATININE 1.71* 12/10/2011   BUN 32* 12/10/2011   CO2 28 12/10/2011   INR 0.98 06/08/2010   HGBA1C  Value: 5.6 (NOTE)                                                                       According to the ADA Clinical Practice Recommendations for 2011, when HbA1c is used as a screening test:   >=6.5%   Diagnostic of Diabetes Mellitus           (if abnormal result  is confirmed)  5.7-6.4%   Increased risk of developing Diabetes Mellitus  References:Diagnosis and Classification of Diabetes Mellitus,Diabetes Care,2011,34(Suppl 1):S62-S69 and Standards of Medical Care in         Diabetes - 2011,Diabetes Care,2011,34  (Suppl 1):S11-S61. 06/08/2010     ASSESSMENT AND PLAN:   1. History of breast cancer. I discussed with Ms. Lavalley that she has no evidence of disease recurrence or metastatic disease on clinical history, physical exam, laboratory tests. She had a mammogram done with [facility] in October 2012 which was negative. Next mammogram is due in October 2013. Bone density in 07/2011 did not showed osteoporosis. I encouraged her to continue with Cal/Vitamin D prophylaxis.  2. Hypertension. She is on  atenolol/chlorthalidone, lisinopril per PCP.  Her SBP was still elevated.  I defer to her PCP for BP titration as appropriate.  3. Hyperlipidemia. She is on simvastatin per PCP. 4. History of CVA. She is on aspirin, simvastatin and beta blocker per PCP. 5. Hypothyroidism. She is on levothyroxine per PCP. 6. Renal insufficiency: most likely due to chronic HTN.  Cr today is pending.  I defer to her PCP to see when a referral to Nephrology is indicated.  I sent for SPEP, serum free light chain today to rule out myeloma.  7. Normocytic anemia: slightly worsened than last time.  Her EGD and colonoscopy were reportedly negative in 2012.  I sent for iron panel and SPEP.  8. Followup. Lab appointment at the [facility] in about 3 months to follow up with her anemia.  Return visit in about 6 months.

## 2012-06-22 ENCOUNTER — Telehealth: Payer: Self-pay | Admitting: *Deleted

## 2012-06-22 LAB — PROTEIN ELECTROPHORESIS, SERUM
Albumin ELP: 61.5 % (ref 55.8–66.1)
Alpha-1-Globulin: 5 % — ABNORMAL HIGH (ref 2.9–4.9)
Alpha-2-Globulin: 11 % (ref 7.1–11.8)
Beta 2: 4.1 % (ref 3.2–6.5)

## 2012-06-22 LAB — KAPPA/LAMBDA LIGHT CHAINS: Kappa:Lambda Ratio: 1.75 — ABNORMAL HIGH (ref 0.26–1.65)

## 2012-06-22 LAB — IRON AND TIBC: TIBC: 335 ug/dL (ref 250–470)

## 2012-06-22 NOTE — Telephone Encounter (Signed)
Pt left VM requesting results of recent labwork.

## 2012-06-23 ENCOUNTER — Telehealth: Payer: Self-pay | Admitting: *Deleted

## 2012-06-23 NOTE — Telephone Encounter (Signed)
Looking at this patient  Schedule has of 06-23-2012 the patient as been scheduled all her appointments

## 2012-07-05 ENCOUNTER — Ambulatory Visit: Payer: Medicare Other | Admitting: Pulmonary Disease

## 2012-07-21 NOTE — Progress Notes (Signed)
Received office notes from Dr. Lorin Picket MacDiarmid @ [facility]; forwarded to Dr. Gaylyn Rong.

## 2012-08-02 ENCOUNTER — Other Ambulatory Visit: Payer: Self-pay | Admitting: Pulmonary Disease

## 2012-08-02 MED ORDER — FAMOTIDINE 20 MG PO TABS: 20.0000 mg | ORAL_TABLET | Freq: Two times a day (BID) | ORAL | Status: DC | PRN

## 2012-08-02 MED ORDER — POTASSIUM CHLORIDE CRYS ER 20 MEQ PO TBCR: 20.0000 meq | EXTENDED_RELEASE_TABLET | Freq: Every day | ORAL | Status: DC

## 2012-08-02 MED ORDER — LISINOPRIL 20 MG PO TABS: 20.0000 mg | ORAL_TABLET | Freq: Every day | ORAL | Status: DC

## 2012-08-02 MED ORDER — ATENOLOL-CHLORTHALIDONE 50-25 MG PO TABS: 1.0000 | ORAL_TABLET | Freq: Every day | ORAL | Status: DC

## 2012-08-02 MED ORDER — ANASTROZOLE 1 MG PO TABS: 1.0000 mg | ORAL_TABLET | Freq: Every day | ORAL | Status: DC

## 2012-08-02 MED ORDER — SIMVASTATIN 20 MG PO TABS: 20.0000 mg | ORAL_TABLET | Freq: Every day | ORAL | Status: DC

## 2012-08-08 ENCOUNTER — Telehealth: Payer: Self-pay | Admitting: *Deleted

## 2012-08-08 NOTE — Telephone Encounter (Signed)
Pt called to request labs sent to Dr. Kriste Basque,  She has appt w/ him coming up.  Routed labs to Dr. Kriste Basque in [place].

## 2012-08-25 ENCOUNTER — Encounter: Payer: Self-pay | Admitting: *Deleted

## 2012-08-26 ENCOUNTER — Other Ambulatory Visit (INDEPENDENT_AMBULATORY_CARE_PROVIDER_SITE_OTHER): Payer: Medicare Other

## 2012-08-26 ENCOUNTER — Ambulatory Visit (INDEPENDENT_AMBULATORY_CARE_PROVIDER_SITE_OTHER): Payer: Medicare Other | Admitting: Pulmonary Disease

## 2012-08-26 ENCOUNTER — Encounter: Payer: Self-pay | Admitting: Pulmonary Disease

## 2012-08-26 VITALS — BP 144/70 | HR 60 | Temp 97.3°F | Ht <= 58 in | Wt 113.4 lb

## 2012-08-26 DIAGNOSIS — K219 Gastro-esophageal reflux disease without esophagitis: Secondary | ICD-10-CM

## 2012-08-26 DIAGNOSIS — E7800 Pure hypercholesterolemia, unspecified: Secondary | ICD-10-CM

## 2012-08-26 DIAGNOSIS — I679 Cerebrovascular disease, unspecified: Secondary | ICD-10-CM

## 2012-08-26 DIAGNOSIS — Z23 Encounter for immunization: Secondary | ICD-10-CM

## 2012-08-26 DIAGNOSIS — I10 Essential (primary) hypertension: Secondary | ICD-10-CM

## 2012-08-26 DIAGNOSIS — G609 Hereditary and idiopathic neuropathy, unspecified: Secondary | ICD-10-CM

## 2012-08-26 DIAGNOSIS — M159 Polyosteoarthritis, unspecified: Secondary | ICD-10-CM

## 2012-08-26 DIAGNOSIS — I6350 Cerebral infarction due to unspecified occlusion or stenosis of unspecified cerebral artery: Secondary | ICD-10-CM

## 2012-08-26 DIAGNOSIS — R32 Unspecified urinary incontinence: Secondary | ICD-10-CM

## 2012-08-26 DIAGNOSIS — K5900 Constipation, unspecified: Secondary | ICD-10-CM

## 2012-08-26 DIAGNOSIS — J42 Unspecified chronic bronchitis: Secondary | ICD-10-CM

## 2012-08-26 DIAGNOSIS — F411 Generalized anxiety disorder: Secondary | ICD-10-CM

## 2012-08-26 DIAGNOSIS — C50919 Malignant neoplasm of unspecified site of unspecified female breast: Secondary | ICD-10-CM

## 2012-08-26 DIAGNOSIS — K5730 Diverticulosis of large intestine without perforation or abscess without bleeding: Secondary | ICD-10-CM

## 2012-08-26 DIAGNOSIS — E039 Hypothyroidism, unspecified: Secondary | ICD-10-CM

## 2012-08-26 DIAGNOSIS — N182 Chronic kidney disease, stage 2 (mild): Secondary | ICD-10-CM

## 2012-08-26 LAB — HEPATIC FUNCTION PANEL
ALT: 16 U/L (ref 0–35)
AST: 24 U/L (ref 0–37)
Albumin: 4.5 g/dL (ref 3.5–5.2)
Alkaline Phosphatase: 46 U/L (ref 39–117)
Bilirubin, Direct: 0.1 mg/dL (ref 0.0–0.3)
Total Bilirubin: 0.6 mg/dL (ref 0.3–1.2)
Total Protein: 8 g/dL (ref 6.0–8.3)

## 2012-08-26 LAB — BASIC METABOLIC PANEL
BUN: 28 mg/dL — ABNORMAL HIGH (ref 6–23)
Creatinine, Ser: 1.6 mg/dL — ABNORMAL HIGH (ref 0.4–1.2)
GFR: 33.12 mL/min — ABNORMAL LOW (ref >60.00)
Glucose, Bld: 98 mg/dL (ref 70–99)
Potassium: 4.3 mEq/L (ref 3.5–5.1)

## 2012-08-26 NOTE — Patient Instructions (Addendum)
Today we updated your med list in our EPIC system...    Continue your current medications the same...  For your Cough:    Try the OTC cough syrup> DELSYM & take 2 tsp every 12H...  For your Constipation:    Start the OTC MIRALAX> one capful of the powder in water daily...    And you may add in SENAKOT-S 1-2 tabs at bedtime...  We gave you the 2013 Flu vaccine today...   We also did your follow up blood work to check your renal function & we will call you w/ the results...  Stay as active as possible...  Call for any problems...  Let's continue our 35mo follow up visits.Marland KitchenMarland Kitchen

## 2012-08-26 NOTE — Progress Notes (Signed)
Subjective:    Patient ID: Alison Cordova, female    DOB: 05-15-1926, 78 y.o.   MRN: [id number]  HPI 78 y/o WF here for a follow up visit... she has mult med problems as noted below... she is HOH and had a cochlear implant yrs ago... she is 78 y/o and rather frail (lives w/ her daughter & her family)...  ~  February 19, 2010:  after she was here last she had an abn mammogram and surg 11/10 by [facility]-  left total mastectomy & sentinel node bx= 2.2cm invasive ductal carcinoma & 1/3 nodes pos for micromets... oncology per [facility] on aromatase inhibitor ARIMIDEX 1mg /d... last seen 01/17/10 f/u & note reviewed... she continues to see DrSanders for Ophthalmology w/ series of AVASTIN shots that seem to be helping according to the pt....  BP controlled on meds, due for CXR (NAD- RML scarring, left mastectomy, DJD sp) & fasting labs (all essent WNL- creat=1.4)... OK TDAP today.  ~  June 26, 2010:  Hosp 7/17-19 w/ TIA (right facial numbness & tingling which resolved spont)... MRI w/o acute change- atrophy, sm vessel dis, scat lacunes, CSpine dis;  MRA showed mild to mod atherosclerotic changes, 50% left vertebral stenosis, ?3mm aneurysmal dil right paraophthalmic art... ONLY MED CHANGE WAS PLAVIX SWITCHED TO "POINT STUDY" DRUG (Placebo vs Plavix)> followed by DrSethi... BP controlled on meds;  Chol looks good on Simva20;  Thyroid regulated w/ Levothy50;  Prilosec changed to PEPCID 20mg /d...   ~  December 25, 2010:  74mo ROV- doing satis overall> f/u labs are satis x FLP not at goal on Simva20 & reminded re: low chol/ low fat diet... she states stools were pos for blood per GYN & needs f/u DrDBrodie (stool cards here are neg)...    She tells me that DrSethi switched her from ASA + study drug to Aggrenox Rx but she is upset w/ the cost factor & will discuss alternatives w/ Neurology...    She had Oncology f/u [facility] 12/11> doing satis on Anastozole1mg /d, no changes made... she also saw her surgeon, DrIngram 11/11 &  similarly stable, doing well, no changes.  ~  July 03, 2011:  74mo ROV & she reports stable; we reviewed meds but she didn't bring her bottles> she notes that DrSethi changed her Aggrenox (too $$) to a coated ASA 325mg /d & she's been stable w/o cerebral ischemic symptoms;  She has hx recurrent bronchitic infections in the past but no recent problems;  BP controlled on Aten/Hct;  Chol stable on Simva20 but needs better diet;  Etc...    She saw DrDBrodie 3/12 for GI eval due to heme pos stools> Colonoscopy showed Hems otherw neg, & rec hi fiber diet; She also has hx remote Nissen & EGD showed mild gastitis- REC to take Prilosec vs Pepcid...     She also saw DrHa for Oncology f/u 4/12 & stable on Arimidex...    CXR today shows ?12mm nodule right lung base> ?assoc w/ ant tip of rib? We will sched CT Chest ==> CT 8/12 showed min patchy atx, NO NODULE seen on CT, no adenopathy, s/p left mastectomy, DJD spine...  ~  January 04, 2012:  74mo ROV & she has mult somatic complaints> tired, back pain, HAs, sl dizzy on occas, wants handicap sticker- ok; offered pain meds etc but she declines "I use Tylenol"    She saw DrHa 1/13 for f/u breast cancer on Arimidex since 11/10> note reviewed: +invasion & 1/3 nodes; neg mammogram 10/12  at [facility]; BMD 9/12 here was neg w/o osteoporosis; no changes made...    She saw DrHIngram 12/12 for surg f/u & he released her to [facility] care, stable, no known recurrence, f/u prn...    We reviewed lab summary & prev Imaging> labs done 1/13 showed Creat 1.7, Hg 11.0;  She had BMD 9/12 w/ TScores +6.4 in Spine and -0.7 in left FemNeck...  ~  August 26, 2012:  54mo ROV & Ariza notes "I'm getting old, slowing down" but generally stable & only c/o constip & we discussed Miralax, Senakot-S, etc...     She had Urology f/u DrMacDiarmid 8/13> urge incont, pt c/o "I'm leaking"; w/u revealed clear urine, bladder scan w/ 50cc residual, neg cysto; placed on Toviaz 4mg /d & pt states improved...    She  had Oncology f/u w/ Lenis Noon 7/13> breast cancer (surg 11/10, on Arimidex since then), anemia, renal insuffic> doing well, no evid mets, he does labs every 79mo...    We reviewed prob list, meds, xrays and labs> see below for updates >> OK Flu vaccine today. LABS 7/13:  Chems- ok w/ BUN=21 Creat=1.46;  CBC- Hg=10.5, Fe=81, SPE/IEP=ok... LABS 10/13:  Chems- ok w/ Creat=76.6          Problem List:  GLAUCOMA (ICD-365.9) - followed by DrHollander on gtts... ~  5/10: sent to retina spec & saw DrSanders 5/10 w/ macular edema & hemorrhage- s/p laser Rx x2. ~  subseq started on series of AVASTIN shots in the eyes that seem to be helping.  ALLERGIC RHINITIS (ICD-477.9) - on OTC Zyretek, & FLONASE...  BRONCHITIS, RECURRENT (ICD-491.9) - she is an ex-smoker... she had hx of LLL pneumonia in 2000... she denies cough, sputum, change in SOB, etc... ~  CXR 3/11 showed RML scarring, otherw clear lungs, DJD sp, left mastectomy... ~  CXR 8/12 showed ?12mm RLL nodule (near an ant rib tip) ==> CTChest 8/12 confirmed no lung nodule present, no adenopathy, calcif Ao arch, left mastectomy.  HYPERTENSION (ICD-401.9) - on ATENOLOL/Hct 50-25 daily + K20/d & LISINOPRIL 20mg /d...   ~  8/12: BP=136/64 today and similar on home checks... she denies CP, palpit, change in SOB...  ~  2/13: BP= 114/64 & she remains asymptomatic... ~  10/13:  BP= 144/70 & she denies CP, palpit, ch in SOB, edema...  CEREBROVASCULAR DISEASE (ICD-437.9) - Hosp 7/11 w/ TIA (right facial numbness & tingling which resolved spont)...  ~  MRI 7/11w/o acute change- atrophy, sm vessel dis, scat lacunes, CSpine dis;  MRA showed mild to mod atherosclerotic changes, 50% left vertebral stenosis, ?3mm aneurysmal dil right paraophthalmic art... followed by DrSethi> initially on study drug, then switched to AGGRENOX Bid, but she refused due to cost, then changed to ASA 325mg /d.  HYPERCHOLESTEROLEMIA (ICD-272.0) - on SIMVASTATIN 20mg /d + Fish Oil daily... ~  FLP  4/08 on Simva20 showed TChol 178, TG 171, HDL 71, LDL 73 ~  FLP 4/09 showed TChol 222, TG 112, HDL 109, LDL 95... continue same- GREAT HDL!!! ~  FLP 3/10 on Simva20 showed TChol 179, TG 176, HDL 81, LDL 63... continue same. ~  FLP 9/10 on Simva 20 showed TChol 182, TG 174, HDL 77, LDL 70 ~  FLP 3/11 on Simva20 showed TChol 199, TG 139, HDL 88, LDL 84 ~  FLP 7/11 in hosp on Simva20 showed TChol 180, TG 186, HDL 60, LDL 83 ~  FLP 2/12 on Simva20 showed TChol 217, TG 163, HDL 68, LDL 128... reminded about diet + med. ~  FLP 2/13 on Simva20 showed TChol 205, TG 115, HDL 70, LDL 116  HYPOTHYROIDISM (ICD-244.9) - on SYNTHROID 94mcg/d... ~  labs 4/08 showed TSH = 0.33 ~  labs 4/09 showed TSH= 1.33 ~  labs 3/10 showed TSH= 1.42 ~  labs 3/11 showed TSH= 1.59 ~  labs 7/11 in hosp showed TSH= 1.63 ~  labs 2/12 on Synthroid50 showed TSH= 1.89 ~  Labs 2/13 on Synthroid50 showed TSH= 1.25  GERD (ICD-530.81) - she has noted some GERD symptoms: rec> Pepcid 20mg  OTC daily but she uses Prn. ~  EGD 3/12 by DrDBrodie showed evid of prev fundoplication, mild gastitis... DIVERTICULOSIS OF COLON (ICD-562.10) -  ~  Colonoscopy 11/06 by DrBrodie was normal, no change from 2000... IRRITABLE BOWEL SYNDROME (ICD-564.1) HEMORRHOIDS >> heme pos stool 3/12 was evaluated by DrDBrodie & determined to be due to Hems... ~  Colonoscopy 3/12 was neg x internal hemorrhoids  Hx of URINARY INCONTINENCE (ICD-788.30) - she saw DrPeterson and had urodynamic studies in 2008... she wears pads. RENAL INSUFFICIENCY >> ~  Labs 4/12 showed BUN=30, Creat=1.53 ~  Labs 8/12 showed BUN=29, Creat-1.66 ~  Labs 1/13 showed BUN=32, Creat=1.71, reminded to stay well hydrated. ~  Labs 10/13 showed BUN=28, Creat=1.6  ADENOCARCINOMA, LEFT BREAST (ICD-174.9) - diagnosed w/ left breast adenocarcinoma 11/10 w/ total left mastectomy, sentinel node bx (micro mets in 1/3 nodes), & adjuvant hormonal therapy w/ ANASTROZOLE 1mg /d followed by Lenis Noon since  11/10. ~  She saw DrHIngram 12/12 for surg f/u & he released her to [facility] care, stable, no known recurrence, f/u prn... ~  She saw DrHa 1/13 for f/u breast cancer on Arimidex since 11/10> note reviewed +invasion & 1/3 nodes; neg mammogram 10/12 at [facility]; BMD 9/12 here was neg w/o osteoporosis; no changes made... ~  She saw DrHa 7/13> breast cancer (surg 11/10, on Arimidex since then), anemia, renal insuffic> doing well, no evid mets, he does labs every 32mo...  DEGENERATIVE JOINT DISEASE, GENERALIZED >> Aware, she declines offeres for pain meds & prefers to use Tylenol prn... ~  BMD here 9/12 showed TScores +6.4 in Spine and -0.7 in left FemNeck;  rec Calcium, MVI, VitD...  CVA (ICD-434.91) - on ASA 325mg /d and then POINT Study drug (Placebo vs Plavix)> then AGGRENOX Bid per neuro but she couldn't afford & switched to ASA 325mg /d. ~  she was seen by Autumn Patty in 2001 w/ studies showing bilat sm vessel ischemic strokes... ~  [facility] 7/11 w/ TIA << SEE ABOVE >>  HEADACHE (ICD-784.0) Hx of PERIPHERAL NEUROPATHY (ICD-356.9) - takes NORTRIPTYLINE 10mg - 2tabsQhs (per Psyche)... c/o her feet bothering her at night...   DYSTHYMIA (ICD-300.4) - on LUVOX + NORTRIPTYLINE per Psychiatry, DrPamPittman... incr stress- 56y/o son w/ MI...  ANEMIA (ICD-285.9) ~  abs 4/09 showed Hg= 13.7 ~  labs 3/11 showed Hg= 12.6 ~  labs 7/11 in hosp showed Hg= 12.6 ~  labs 2/12 showed Hg= 12.6 ~  Labs 1/13 showed Hg= 11.0, MCV= 97, & she was rec to take 1-a-day w/ Fe... ~  Labs 7/13 showed Hg= 10.5 w/ neg anemia w/u by Lenis Noon...  Health Maintenance - GYN = prev DrMcPail, but saw new GYN 2011 & told no further evals needed... Mammograms at [facility] (Mammogram 10/10 w/ left breast mass- referred to [facility] & Bx pos for infiltrating ductal cancer)...  BMD here 9/10 showed TScores +5.3 in spine & -0.2 in [facility]... Vit D level 4/09 was 37 & Rx w/ OTC 1000 u daily... she gets yearly Flu shots;  had Pneumovax 48yrs ago;  given TDAP  3/11.   Past Surgical History  Procedure Date  . Vaginal hysterectomy   . Spine surgery   .  fundiplication   . Eye surgery     TO DESOLVE AN AREA IN HER LEFT EYE?  . Breast surgery     left breast mastectomy     Outpatient Encounter Prescriptions as of 08/26/2012  Medication Sig Dispense Refill  . acetaminophen (TYLENOL) 650 MG CR tablet Take 650 mg by mouth every 8 (eight) hours as needed.        Marland Kitchen anastrozole (ARIMIDEX) 1 MG tablet Take 1 tablet (1 mg total) by mouth daily.  90 tablet  3  . aspirin 325 MG tablet Take 325 mg by mouth daily.        Marland Kitchen atenolol-chlorthalidone (TENORETIC) 50-25 MG per tablet Take 1 tablet by mouth daily.  90 tablet  3  . cetirizine (ZYRTEC) 10 MG tablet Take 10 mg by mouth daily.        . famotidine (PEPCID) 20 MG tablet Take 1 tablet (20 mg total) by mouth 2 (two) times daily as needed.  180 tablet  3  . fesoterodine (TOVIAZ) 4 MG TB24 Take 4 mg by mouth daily.      . fish oil-omega-3 fatty acids 1000 MG capsule Take 2 g by mouth daily.        . fluocinonide cream (LIDEX) 0.05 % Apply topically 2 (two) times daily.  90 g  3  . fluticasone (FLONASE) 50 MCG/ACT nasal spray Place 2 sprays into the nose daily.  48 g  3  . fluvoxaMINE (LUVOX) 100 MG tablet Take as directed by Dr. Raquel James      . latanoprost (XALATAN) 0.005 % ophthalmic solution Place 1 drop into both eyes at bedtime.        Marland Kitchen levothyroxine (SYNTHROID, LEVOTHROID) 50 MCG tablet Take 1 tablet (50 mcg total) by mouth daily.  90 tablet  3  . lisinopril (PRINIVIL,ZESTRIL) 20 MG tablet Take 1 tablet (20 mg total) by mouth daily.  90 tablet  3  . Multiple Vitamins-Minerals (WOMENS MULTIVITAMIN PLUS PO) Take 1 tablet by mouth daily.        . nortriptyline (PAMELOR) 10 MG capsule Take 10 mg by mouth at bedtime.        . potassium chloride SA (K-DUR,KLOR-CON) 20 MEQ tablet Take 1 tablet (20 mEq total) by mouth daily.  90 tablet  3  . simvastatin (ZOCOR) 20 MG tablet Take 1 tablet (20 mg total) by  mouth at bedtime.  90 tablet  3  . timolol (BETIMOL) 0.5 % ophthalmic solution Place 1 drop into both eyes 2 (two) times daily.      Marland Kitchen tobramycin (TOBREX) 0.3 % ophthalmic ointment Place 1 application into the left eye as needed.      Marland Kitchen DISCONTD: Calcium Carbonate-Vitamin D (CALCIUM 600+D) 600-400 MG-UNIT per tablet Take 1 tablet by mouth 2 (two) times daily.          No Known Allergies   Current Medications, Allergies, Past Medical History, Past Surgical History, Family History, and Social History were reviewed in [employer] record.    Review of Systems       See HPI - all other systems neg except as noted...  The patient complains of dyspnea on exertion.  The patient denies anorexia, fever, weight loss, weight gain, vision loss, decreased hearing, hoarseness, chest pain, syncope, peripheral edema, prolonged cough, headaches, hemoptysis,  abdominal pain, melena, hematochezia, severe indigestion/heartburn, hematuria, incontinence, muscle weakness, suspicious skin lesions, transient blindness, difficulty walking, depression, unusual weight change, abnormal bleeding, enlarged lymph nodes, and angioedema.     Objective:   Physical Exam     WD, Thin, 78 y/o WF in NAD... she is chr ill appearing...  GENERAL:  Alert & oriented; pleasant & cooperative... HEENT:  [place]/AT, EACs-clear, TMs-wnl, Cochlear implant, NOSE-clear, THROAT-clear & wnl. NECK:  Supple w/ fairROM; no JVD; normal carotid impulses w/o bruits; no thyromegaly or nodules palpated; no lymphadenopathy. CHEST:  s/p left mastectomy, clear lungs w/o wheezing, rales, or rhonchi... HEART:  Regular Rhythm;  g 1/6 SEM without rubs or gallops... ABDOMEN:  Soft & nontender; normal bowel sounds; no organomegaly or masses detected. EXT: without deformities, mod arthritic changes; no varicose veins/ +venous insuffic/ no edema.  NEURO:  CN's intact;  no asymmetry;  no focal neuro deficits... DERM:  No lesions noted; no rash  etc...  RADIOLOGY DATA:  Reviewed in the EPIC EMR & discussed w/ the patient> see above...  LABORATORY DATA:  Reviewed in the EPIC EMR & discussed w/ the patient> see above...   Assessment & Plan:    Hx recurrent bronchitis/ no RLL nodule on CT>  CXR 8/12 suggested poss RLL nodule, but CT confirmed it was just the ant portion of right 6th rib (costochondral junction); no other lesions identified on CT...  Breathing well, no recurrent bronchitic episode, etc...  HBP>  Controlled on meds, continue same...  CEREBROVASC Dis/ TIA>  As noted she is currently taking ASA 325mg /d & has not had any cerebral ischemic symptoms.  CHOL>  On Simva20 + diet & reminded to take med regularly...  HYPOTHYROID>  Stable on Synthroid 28mcg/d...  GI>  Gastitis, Hx NissenFundoplication, Divertics, IBS, Hems>  3/12 GI eval DrDBrodie reviewed- BRB in stool likely from [facility] & rx by GI...  Renal Insuffic>  Stable renal insuffic w/ Creat= 1.6-1.7 range; she knows to avoid NSAIDs etc...  Breast Cancer>  Followed by Lenis Noon on Armidex & stable...  DJD>  She uses OTC anti-inflamm as needed...  DYSTHYMIA>  Followed by DrPPittman on Luvox & Nortriptyline...   Patient's Medications  New Prescriptions   No medications on file  Previous Medications   ACETAMINOPHEN (TYLENOL) 650 MG CR TABLET    Take 650 mg by mouth every 8 (eight) hours as needed.     ANASTROZOLE (ARIMIDEX) 1 MG TABLET    Take 1 tablet (1 mg total) by mouth daily.   ASPIRIN 325 MG TABLET    Take 325 mg by mouth daily.     ATENOLOL-CHLORTHALIDONE (TENORETIC) 50-25 MG PER TABLET    Take 1 tablet by mouth daily.   CETIRIZINE (ZYRTEC) 10 MG TABLET    Take 10 mg by mouth daily.     FAMOTIDINE (PEPCID) 20 MG TABLET    Take 1 tablet (20 mg total) by mouth 2 (two) times daily as needed.   FESOTERODINE (TOVIAZ) 4 MG TB24    Take 4 mg by mouth daily.   FISH OIL-OMEGA-3 FATTY ACIDS 1000 MG CAPSULE    Take 2 g by mouth daily.     FLUOCINONIDE CREAM (LIDEX)  0.05 %    Apply topically 2 (two) times daily.   FLUTICASONE (FLONASE) 50 MCG/ACT NASAL SPRAY    Place 2 sprays into the nose daily.   FLUVOXAMINE (LUVOX) 100 MG TABLET    Take as directed by Dr. Raquel James   LATANOPROST (XALATAN) 0.005 % OPHTHALMIC SOLUTION  Place 1 drop into both eyes at bedtime.     LEVOTHYROXINE (SYNTHROID, LEVOTHROID) 50 MCG TABLET    Take 1 tablet (50 mcg total) by mouth daily.   LISINOPRIL (PRINIVIL,ZESTRIL) 20 MG TABLET    Take 1 tablet (20 mg total) by mouth daily.   MULTIPLE VITAMINS-MINERALS (WOMENS MULTIVITAMIN PLUS PO)    Take 1 tablet by mouth daily.     NORTRIPTYLINE (PAMELOR) 10 MG CAPSULE    Take 10 mg by mouth at bedtime.     POTASSIUM CHLORIDE SA (K-DUR,KLOR-CON) 20 MEQ TABLET    Take 1 tablet (20 mEq total) by mouth daily.   SIMVASTATIN (ZOCOR) 20 MG TABLET    Take 1 tablet (20 mg total) by mouth at bedtime.   TIMOLOL (BETIMOL) 0.5 % OPHTHALMIC SOLUTION    Place 1 drop into both eyes 2 (two) times daily.   TOBRAMYCIN (TOBREX) 0.3 % OPHTHALMIC OINTMENT    Place 1 application into the left eye as needed.  Modified Medications   No medications on file  Discontinued Medications   CALCIUM CARBONATE-VITAMIN D (CALCIUM 600+D) 600-400 MG-UNIT PER TABLET    Take 1 tablet by mouth 2 (two) times daily.

## 2012-09-08 NOTE — Progress Notes (Signed)
Received office notes from Dr. Herbert Deaner @ [facility]; forwarded to Dr. Gaylyn Rong.

## 2012-09-16 ENCOUNTER — Telehealth: Payer: Self-pay | Admitting: *Deleted

## 2012-09-16 ENCOUNTER — Other Ambulatory Visit (HOSPITAL_BASED_OUTPATIENT_CLINIC_OR_DEPARTMENT_OTHER): Payer: Medicare Other | Admitting: Lab

## 2012-09-16 DIAGNOSIS — N182 Chronic kidney disease, stage 2 (mild): Secondary | ICD-10-CM

## 2012-09-16 DIAGNOSIS — C50919 Malignant neoplasm of unspecified site of unspecified female breast: Secondary | ICD-10-CM

## 2012-09-16 DIAGNOSIS — D649 Anemia, unspecified: Secondary | ICD-10-CM

## 2012-09-16 LAB — CBC WITH DIFFERENTIAL/PLATELET
Basophils Absolute: 0 10*3/uL (ref 0.0–0.1)
EOS%: 3.4 % (ref 0.0–7.0)
Eosinophils Absolute: 0.2 10*3/uL (ref 0.0–0.5)
HGB: 11.7 g/dL (ref 11.6–15.9)
LYMPH%: 39.3 % (ref 14.0–49.7)
MCH: 32.1 pg (ref 25.1–34.0)
MCV: 97.8 fL (ref 79.5–101.0)
MONO%: 21.7 % — ABNORMAL HIGH (ref 0.0–14.0)
NEUT#: 1.7 10*3/uL (ref 1.5–6.5)
Platelets: 209 10*3/uL (ref 145–400)
RDW: 13.3 % (ref 11.2–14.5)

## 2012-09-16 NOTE — Telephone Encounter (Signed)
Called pt w/ CBC results, informed on hgb wnl and not anemic.  Keep next appt in January as scheduled.  She verbalized understanding.

## 2012-09-16 NOTE — Telephone Encounter (Signed)
Message copied by Wende Mott on Fri Sep 16, 2012  9:54 AM ------      Message from: Jethro Bolus T      Created: Fri Sep 16, 2012  9:15 AM       Please call patient. Her hemoglobin is normal, and is no longer anemic likely months ago. We'll continue observation. Thank you.

## 2012-11-18 ENCOUNTER — Telehealth: Payer: Self-pay | Admitting: Pulmonary Disease

## 2012-11-18 MED ORDER — FLUTICASONE PROPIONATE 50 MCG/ACT NA SUSP: 2.0000 | Freq: Every day | NASAL | Status: DC

## 2012-11-18 MED ORDER — FLUOCINONIDE 0.05 % EX CREA: TOPICAL_CREAM | Freq: Two times a day (BID) | CUTANEOUS | Status: DC

## 2012-11-18 NOTE — Telephone Encounter (Signed)
Rx's have been sent to the pharmacy. Pt is aware.

## 2012-12-19 ENCOUNTER — Encounter: Payer: Self-pay | Admitting: Oncology

## 2012-12-19 ENCOUNTER — Other Ambulatory Visit (HOSPITAL_BASED_OUTPATIENT_CLINIC_OR_DEPARTMENT_OTHER): Payer: Medicare Other | Admitting: Lab

## 2012-12-19 ENCOUNTER — Ambulatory Visit (HOSPITAL_BASED_OUTPATIENT_CLINIC_OR_DEPARTMENT_OTHER): Payer: Medicare Other | Admitting: Oncology

## 2012-12-19 ENCOUNTER — Telehealth: Payer: Self-pay | Admitting: Oncology

## 2012-12-19 VITALS — BP 139/62 | HR 79 | Temp 97.8°F | Resp 20 | Ht <= 58 in | Wt 111.7 lb

## 2012-12-19 DIAGNOSIS — D649 Anemia, unspecified: Secondary | ICD-10-CM

## 2012-12-19 DIAGNOSIS — C50919 Malignant neoplasm of unspecified site of unspecified female breast: Secondary | ICD-10-CM

## 2012-12-19 DIAGNOSIS — I10 Essential (primary) hypertension: Secondary | ICD-10-CM

## 2012-12-19 DIAGNOSIS — N289 Disorder of kidney and ureter, unspecified: Secondary | ICD-10-CM

## 2012-12-19 DIAGNOSIS — N182 Chronic kidney disease, stage 2 (mild): Secondary | ICD-10-CM

## 2012-12-19 DIAGNOSIS — D72819 Decreased white blood cell count, unspecified: Secondary | ICD-10-CM

## 2012-12-19 LAB — COMPREHENSIVE METABOLIC PANEL (CC13)
ALT: 39 U/L (ref 0–55)
AST: 47 U/L — ABNORMAL HIGH (ref 5–34)
Albumin: 4.2 g/dL (ref 3.5–5.0)
Alkaline Phosphatase: 51 U/L (ref 40–150)
Glucose: 104 mg/dl — ABNORMAL HIGH (ref 70–99)
Potassium: 3.9 mEq/L (ref 3.5–5.1)
Sodium: 140 mEq/L (ref 136–145)
Total Bilirubin: 0.49 mg/dL (ref 0.20–1.20)
Total Protein: 7.8 g/dL (ref 6.4–8.3)

## 2012-12-19 LAB — CBC WITH DIFFERENTIAL/PLATELET
BASO%: 0.5 % (ref 0.0–2.0)
EOS%: 2.2 % (ref 0.0–7.0)
LYMPH%: 47.9 % (ref 14.0–49.7)
MCH: 32.2 pg (ref 25.1–34.0)
MCHC: 34 g/dL (ref 31.5–36.0)
MCV: 94.9 fL (ref 79.5–101.0)
MONO%: 27 % — ABNORMAL HIGH (ref 0.0–14.0)
NEUT#: 0.7 10*3/uL — ABNORMAL LOW (ref 1.5–6.5)
Platelets: 171 10*3/uL (ref 145–400)
RBC: 3.69 10*6/uL — ABNORMAL LOW (ref 3.70–5.45)
RDW: 13.5 % (ref 11.2–14.5)

## 2012-12-19 NOTE — Progress Notes (Signed)
[facility]  [phone] [phone]   OFFICE PROGRESS NOTE   Cc:  Michele Mcalpine, MD  DIAGNOSIS AND PAST THERAPY:   History of a left breast 2.2cm, grade 2 invasive ductal carcinoma, status post simple mastectomy on September 26, 2009 without angiolymphatic invasions or perineural invasion. Margin of the invasive component was 0.2 cm from the deep margin and in situ component of 0.5 cm from the deep margin; ER 96%, PR 100%, Ki-67 26%, HER2/neu by CISH was negative for ratio of 0.81. One out of 3 nodes with micrometastatic disease.   CURENT THERAPY: adjuvant hormonal therapy Arimidex started in November 2010.  INTERVAL HISTORY: Alison Cordova 79 y.o. female returns for regular follow up with her daughter.  She reports feeling relatively well from her breast cancer treatment.  She denied bone/muscle pain from Arimidex.  She denied breast abnormality.  She however has had urinary stress incontinence and has had to wear diaper.  She has mild fatigue; however, she is independent of personal hygiene activities.  She does not do much household chores anymore.   Patient denies anorexia, weight loss, headache, visual changes, confusion, drenching night sweats, palpable lymph node swelling, mucositis, odynophagia, dysphagia, nausea vomiting, jaundice, chest pain, palpitation, gum bleeding, epistaxis, hematemesis, abdominal pain, abdominal swelling, early satiety, melena, hematochezia, hematuria, skin rash, spontaneous bleeding, joint swelling, joint pain, heat or cold intolerance, back pain, focal motor weakness, paresthesia, depression, suicidal or homocidal ideation, feeling hopelessness.   Past Medical History  Diagnosis Date  . Allergy     SEASONAL  . Depression   . Anxiety   . Arthritis   . Cancer     LEFT BREAST CANCER  . Cataract   . Stroke   . Glaucoma   . Hyperlipidemia   . Hypertension   . Thyroid disease   . Anemia 06/17/2012  . Chronic kidney  disease (CKD), stage II (mild) 06/17/2012    Past Surgical History  Procedure Date  . Vaginal hysterectomy   . Spine surgery   .  fundiplication   . Eye surgery     TO DESOLVE AN AREA IN HER LEFT EYE?  . Breast surgery     left breast mastectomy     Current Outpatient Prescriptions  Medication Sig Dispense Refill  . acetaminophen (TYLENOL) 650 MG CR tablet Take 650 mg by mouth every 8 (eight) hours as needed.        Marland Kitchen anastrozole (ARIMIDEX) 1 MG tablet Take 1 tablet (1 mg total) by mouth daily.  90 tablet  3  . aspirin 325 MG tablet Take 325 mg by mouth daily.        Marland Kitchen atenolol-chlorthalidone (TENORETIC) 50-25 MG per tablet Take 1 tablet by mouth daily.  90 tablet  3  . cetirizine (ZYRTEC) 10 MG tablet Take 10 mg by mouth daily.        . famotidine (PEPCID) 20 MG tablet Take 1 tablet (20 mg total) by mouth 2 (two) times daily as needed.  180 tablet  3  . fesoterodine (TOVIAZ) 4 MG TB24 Take 4 mg by mouth daily.      . fish oil-omega-3 fatty acids 1000 MG capsule Take 2 g by mouth daily.        . fluocinonide cream (LIDEX) 0.05 % Apply topically 2 (two) times daily.  90 g  3  . fluticasone (FLONASE) 50 MCG/ACT nasal spray Place 2 sprays into the nose daily.  48 g  3  .  fluvoxaMINE (LUVOX) 100 MG tablet Take as directed by Dr. Raquel James      . latanoprost (XALATAN) 0.005 % ophthalmic solution Place 1 drop into both eyes at bedtime.        Marland Kitchen levothyroxine (SYNTHROID, LEVOTHROID) 50 MCG tablet Take 1 tablet (50 mcg total) by mouth daily.  90 tablet  3  . lisinopril (PRINIVIL,ZESTRIL) 20 MG tablet Take 1 tablet (20 mg total) by mouth daily.  90 tablet  3  . Multiple Vitamins-Minerals (WOMENS MULTIVITAMIN PLUS PO) Take 1 tablet by mouth daily.        . nortriptyline (PAMELOR) 10 MG capsule Take 10 mg by mouth at bedtime.        . potassium chloride SA (K-DUR,KLOR-CON) 20 MEQ tablet Take 1 tablet (20 mEq total) by mouth daily.  90 tablet  3  . simvastatin (ZOCOR) 20 MG tablet Take 1 tablet (20  mg total) by mouth at bedtime.  90 tablet  3  . timolol (BETIMOL) 0.5 % ophthalmic solution Place 1 drop into both eyes 2 (two) times daily.      Marland Kitchen tobramycin (TOBREX) 0.3 % ophthalmic ointment Place 1 application into the left eye as needed.        ALLERGIES:   has no known allergies.  REVIEW OF SYSTEMS:  The rest of the 14-point review of system was negative.   Filed Vitals:   12/19/12 0846  BP: 139/62  Pulse: 79  Temp: 97.8 F (36.6 C)  Resp: 20   Wt Readings from Last 3 Encounters:  12/19/12 111 lb 11.2 oz (50.667 kg)  08/26/12 113 lb 6.4 oz (51.438 kg)  06/17/12 116 lb 8 oz (52.844 kg)   ECOG Performance status: 1-2  PHYSICAL EXAMINATION:   General: Thin and frail appearing woman in no acute distress. Eyes: no scleral icterus. ENT: There were no oropharyngeal lesions. Neck was without thyromegaly. Lymphatics: Negative cervical, supraclavicular or axillary adenopathy. Respiratory: lungs were clear bilaterally without wheezing or crackles. Cardiovascular: Regular rate and rhythm, S1/S2, without murmur, rub or gallop. There was no pedal edema. GI: abdomen was soft, flat, nontender, nondistended, without organomegaly.  She deferred rectal exam due to colonoscopy and EGD in 2012 which were both negative.  Muscoloskeletal: no spinal tenderness of palpation of vertebral spine. Skin exam was without echymosis, petichae. Neuro exam was nonfocal. Patient was able to get on and off exam table without assistance. Gait was normal. Patient was alerted and oriented. Attention was good. Language was appropriate. Mood was normal without depression. Speech was not pressured. Thought content was not tangential. Bilateral breast exam showed left mastectomy scar without nodule, thickening. Right breast was negative.     LABORATORY/RADIOLOGY DATA:  Lab Results  Component Value Date   WBC 3.3* 12/19/2012   HGB 11.9 12/19/2012   HCT 35.0 12/19/2012   PLT 171 12/19/2012   GLUCOSE 104* 12/19/2012   CHOL  205* 01/04/2012   TRIG 115.0 01/04/2012   HDL 70.20 01/04/2012   LDLDIRECT 115.9 01/04/2012   LDLCALC  Value: 83        Total Cholesterol/HDL:CHD Risk Coronary Heart Disease Risk Table                     Men   Women  1/2 Average Risk   3.4   3.3  Average Risk       5.0   4.4  2 X Average Risk   9.6   7.1  3 X Average Risk  23.4  11.0        Use the calculated Patient Ratio above and the CHD Risk Table to determine the patient's CHD Risk.        ATP III CLASSIFICATION (LDL):  <100     mg/dL   Optimal  409-811  mg/dL   Near or Above                    Optimal  130-159  mg/dL   Borderline  914-782  mg/dL   High  >956     mg/dL   Very High 01/06/864   ALKPHOS 51 12/19/2012   ALT 39 12/19/2012   AST 47* 12/19/2012   NA 140 12/19/2012   K 3.9 12/19/2012   CL 102 12/19/2012   CREATININE 1.7* 12/19/2012   BUN 25.3 12/19/2012   CO2 27 12/19/2012   INR 0.98 06/08/2010   HGBA1C  Value: 5.6 (NOTE)                                                                       According to the ADA Clinical Practice Recommendations for 2011, when HbA1c is used as a screening test:   >=6.5%   Diagnostic of Diabetes Mellitus           (if abnormal result  is confirmed)  5.7-6.4%   Increased risk of developing Diabetes Mellitus  References:Diagnosis and Classification of Diabetes Mellitus,Diabetes Care,2011,34(Suppl 1):S62-S69 and Standards of Medical Care in         Diabetes - 2011,Diabetes Care,2011,34  (Suppl 1):S11-S61. 06/08/2010     ASSESSMENT AND PLAN:   1. History of breast cancer. I discussed with Ms. Cerullo that she has no evidence of disease recurrence or metastatic disease on clinical history, physical exam, laboratory tests. She had a mammogram done with [facility] in October 2013 which was negative. Next mammogram is due in October 2014. Bone density in 07/2011 did not showed osteoporosis. I encouraged her to continue with Cal/Vitamin D prophylaxis.  2. Hypertension. She is on atenolol/chlorthalidone, lisinopril per PCP.  Her  SBP was still elevated. BP is controlled at this time.  3. Hyperlipidemia. She is on simvastatin per PCP. 4. History of CVA. She is on aspirin, simvastatin and beta blocker per PCP. 5. Hypothyroidism. She is on levothyroxine per PCP. 6. Renal insufficiency: most likely due to chronic HTN.  Cr is stable today.  I defer to her PCP to see when a referral to Nephrology is indicated.  Work-up for myeloma was negative previously.  7. Normocytic anemia: Hgb has normalized today. Her EGD and colonoscopy were reportedly negative in 2012.  Iron studies and SPEP normal in July 2013. 8. Leukopenia: Unclear etiology. Counts reviewed with Dr Gaylyn Rong. Will continue to watch CBC every 2 weeks for 2 months. If WBC continues to drop, will set her up for a bone marrow biopsy. 9. Followup. Lab appointment every 2 weeks for 2 months. Return visit in about 6 months.

## 2012-12-19 NOTE — Telephone Encounter (Signed)
gv and printed appt schedule for pt for Feb, March, and July

## 2012-12-27 NOTE — Progress Notes (Signed)
Received office note from Dr. Lorin Picket MacDiarmid @ [facility]; forwarded to Dr. Gaylyn Rong.

## 2013-01-02 ENCOUNTER — Other Ambulatory Visit: Payer: Medicare Other

## 2013-01-04 ENCOUNTER — Other Ambulatory Visit (HOSPITAL_BASED_OUTPATIENT_CLINIC_OR_DEPARTMENT_OTHER): Payer: Medicare Other | Admitting: Lab

## 2013-01-04 ENCOUNTER — Telehealth: Payer: Self-pay | Admitting: *Deleted

## 2013-01-04 DIAGNOSIS — C50919 Malignant neoplasm of unspecified site of unspecified female breast: Secondary | ICD-10-CM

## 2013-01-04 LAB — CBC WITH DIFFERENTIAL/PLATELET
Basophils Absolute: 0 10*3/uL (ref 0.0–0.1)
EOS%: 2 % (ref 0.0–7.0)
Eosinophils Absolute: 0.1 10*3/uL (ref 0.0–0.5)
LYMPH%: 43.3 % (ref 14.0–49.7)
MCH: 32.8 pg (ref 25.1–34.0)
MCV: 95.1 fL (ref 79.5–101.0)
MONO%: 15.4 % — ABNORMAL HIGH (ref 0.0–14.0)
NEUT#: 1.6 10*3/uL (ref 1.5–6.5)
Platelets: 247 10*3/uL (ref 145–400)
RBC: 3.31 10*6/uL — ABNORMAL LOW (ref 3.70–5.45)
RDW: 14.5 % (ref 11.2–14.5)

## 2013-01-04 NOTE — Telephone Encounter (Signed)
Informed pt of Hgb stable and keep every 2 week lab as scheduled.  She verbalized understanding.

## 2013-01-04 NOTE — Telephone Encounter (Signed)
Message copied by Wende Mott on Wed Jan 04, 2013 12:15 PM ------      Message from: Clenton Pare R      Created: Wed Jan 04, 2013 11:28 AM       Call pt/daughter. WBC has normalized now. Anemia is stable. Recommend continued observation. She has labs every 2 weeks. I would like to continue this for a while to ensure stability of counts. ------

## 2013-01-09 ENCOUNTER — Telehealth: Payer: Self-pay | Admitting: Pulmonary Disease

## 2013-01-09 MED ORDER — LEVOTHYROXINE SODIUM 50 MCG PO TABS: 50.0000 ug | ORAL_TABLET | Freq: Every day | ORAL | Status: DC

## 2013-01-09 NOTE — Telephone Encounter (Signed)
Called, spoke with pt.  Levothyroxine 50 mcg qd rx sent to [facility].  Pt aware and voiced no further questions or concerns at this time.

## 2013-01-16 ENCOUNTER — Ambulatory Visit (HOSPITAL_BASED_OUTPATIENT_CLINIC_OR_DEPARTMENT_OTHER): Payer: Medicare Other

## 2013-01-16 DIAGNOSIS — C50919 Malignant neoplasm of unspecified site of unspecified female breast: Secondary | ICD-10-CM

## 2013-01-16 LAB — CBC WITH DIFFERENTIAL/PLATELET
BASO%: 0.6 % (ref 0.0–2.0)
LYMPH%: 43 % (ref 14.0–49.7)
MCHC: 34.4 g/dL (ref 31.5–36.0)
MCV: 94.8 fL (ref 79.5–101.0)
MONO%: 16.4 % — ABNORMAL HIGH (ref 0.0–14.0)
Platelets: 199 10*3/uL (ref 145–400)
RBC: 3.55 10*6/uL — ABNORMAL LOW (ref 3.70–5.45)
RDW: 14 % (ref 11.2–14.5)
WBC: 4.7 10*3/uL (ref 3.9–10.3)

## 2013-01-17 ENCOUNTER — Telehealth: Payer: Self-pay | Admitting: Oncology

## 2013-01-17 NOTE — Telephone Encounter (Signed)
CANCELLED THE PT'S LABS FOR FEB AND MARCH PER 2/24 POF AND CL HICKERSON

## 2013-01-18 ENCOUNTER — Telehealth: Payer: Self-pay | Admitting: Oncology

## 2013-01-18 ENCOUNTER — Telehealth: Payer: Self-pay

## 2013-01-18 NOTE — Telephone Encounter (Signed)
S/w the pt and she is aware that we have cancelled allof her march lab appts

## 2013-01-18 NOTE — Telephone Encounter (Signed)
Message copied by Kallie Locks on Wed Jan 18, 2013  2:27 PM ------      Message from: Alison Cordova      Created: Mon Jan 16, 2013  9:11 PM       Please call pt with labs. WBC has now normalized. I will cancel labs for March. She does not need to come back until July. ------

## 2013-01-30 ENCOUNTER — Other Ambulatory Visit: Payer: Medicare Other

## 2013-02-13 ENCOUNTER — Other Ambulatory Visit: Payer: Medicare Other

## 2013-02-21 ENCOUNTER — Encounter: Payer: Self-pay | Admitting: Pulmonary Disease

## 2013-02-21 ENCOUNTER — Ambulatory Visit (INDEPENDENT_AMBULATORY_CARE_PROVIDER_SITE_OTHER): Payer: Medicare Other | Admitting: Pulmonary Disease

## 2013-02-21 ENCOUNTER — Ambulatory Visit (INDEPENDENT_AMBULATORY_CARE_PROVIDER_SITE_OTHER)
Admission: RE | Admit: 2013-02-21 | Discharge: 2013-02-21 | Disposition: A | Discharge status: Home / Self Care | Payer: Medicare Other | Source: Ambulatory Visit | Attending: Pulmonary Disease | Admitting: Pulmonary Disease

## 2013-02-21 ENCOUNTER — Other Ambulatory Visit (INDEPENDENT_AMBULATORY_CARE_PROVIDER_SITE_OTHER): Payer: Medicare Other

## 2013-02-21 VITALS — BP 130/70 | HR 72 | Temp 97.8°F | Ht <= 58 in | Wt 114.2 lb

## 2013-02-21 DIAGNOSIS — K5730 Diverticulosis of large intestine without perforation or abscess without bleeding: Secondary | ICD-10-CM

## 2013-02-21 DIAGNOSIS — K219 Gastro-esophageal reflux disease without esophagitis: Secondary | ICD-10-CM

## 2013-02-21 DIAGNOSIS — G609 Hereditary and idiopathic neuropathy, unspecified: Secondary | ICD-10-CM

## 2013-02-21 DIAGNOSIS — E559 Vitamin D deficiency, unspecified: Secondary | ICD-10-CM

## 2013-02-21 DIAGNOSIS — E039 Hypothyroidism, unspecified: Secondary | ICD-10-CM

## 2013-02-21 DIAGNOSIS — M159 Polyosteoarthritis, unspecified: Secondary | ICD-10-CM

## 2013-02-21 DIAGNOSIS — N182 Chronic kidney disease, stage 2 (mild): Secondary | ICD-10-CM

## 2013-02-21 DIAGNOSIS — E7800 Pure hypercholesterolemia, unspecified: Secondary | ICD-10-CM

## 2013-02-21 DIAGNOSIS — I679 Cerebrovascular disease, unspecified: Secondary | ICD-10-CM

## 2013-02-21 DIAGNOSIS — J309 Allergic rhinitis, unspecified: Secondary | ICD-10-CM

## 2013-02-21 DIAGNOSIS — I10 Essential (primary) hypertension: Secondary | ICD-10-CM

## 2013-02-21 DIAGNOSIS — J42 Unspecified chronic bronchitis: Secondary | ICD-10-CM

## 2013-02-21 DIAGNOSIS — F341 Dysthymic disorder: Secondary | ICD-10-CM

## 2013-02-21 DIAGNOSIS — C50919 Malignant neoplasm of unspecified site of unspecified female breast: Secondary | ICD-10-CM

## 2013-02-21 DIAGNOSIS — I6350 Cerebral infarction due to unspecified occlusion or stenosis of unspecified cerebral artery: Secondary | ICD-10-CM

## 2013-02-21 DIAGNOSIS — K589 Irritable bowel syndrome without diarrhea: Secondary | ICD-10-CM

## 2013-02-21 DIAGNOSIS — R32 Unspecified urinary incontinence: Secondary | ICD-10-CM

## 2013-02-21 DIAGNOSIS — D649 Anemia, unspecified: Secondary | ICD-10-CM

## 2013-02-21 LAB — CBC WITH DIFFERENTIAL/PLATELET
Basophils Absolute: 0 10*3/uL (ref 0.0–0.1)
Basophils Relative: 0.2 % (ref 0.0–3.0)
Eosinophils Absolute: 0 10*3/uL (ref 0.0–0.7)
HCT: 35.8 % — ABNORMAL LOW (ref 36.0–46.0)
Hemoglobin: 12.2 g/dL (ref 12.0–15.0)
Lymphocytes Relative: 32.6 % (ref 12.0–46.0)
Lymphs Abs: 1.7 10*3/uL (ref 0.7–4.0)
MCHC: 34.2 g/dL (ref 30.0–36.0)
MCV: 95.9 fl (ref 78.0–100.0)
Neutro Abs: 2.6 10*3/uL (ref 1.4–7.7)
RBC: 3.73 Mil/uL — ABNORMAL LOW (ref 3.87–5.11)
RDW: 13.8 % (ref 11.5–14.6)

## 2013-02-21 LAB — BASIC METABOLIC PANEL
Calcium: 10.3 mg/dL (ref 8.4–10.5)
Creatinine, Ser: 1.4 mg/dL — ABNORMAL HIGH (ref 0.4–1.2)
GFR: 39.66 mL/min — ABNORMAL LOW (ref >60.00)
Sodium: 141 mEq/L (ref 135–145)

## 2013-02-21 LAB — HEPATIC FUNCTION PANEL
Alkaline Phosphatase: 43 U/L (ref 39–117)
Bilirubin, Direct: 0.1 mg/dL (ref 0.0–0.3)
Total Bilirubin: 0.7 mg/dL (ref 0.3–1.2)

## 2013-02-21 LAB — LIPID PANEL
HDL: 63.2 mg/dL (ref >39.00)
LDL Cholesterol: 100 mg/dL — ABNORMAL HIGH (ref 0–99)
Total CHOL/HDL Ratio: 3
Triglycerides: 139 mg/dL (ref 0.0–149.0)

## 2013-02-21 NOTE — Patient Instructions (Addendum)
Today we updated your med list in our EPIC system...    Continue your current medications the same...  Today we did your follow up CXR & FASTING blood work...    We will contact you w/ the results when available...   Call for any questions...  Let's plan a follow up visit in 6mo, sooner if needed for problems...   

## 2013-02-21 NOTE — Progress Notes (Signed)
Subjective:    Patient ID: Alison Cordova, female    DOB: Oct 26, 1926, 79 y.o.   MRN: [id number]  HPI 79 y/o WF here for a follow up visit... she has mult med problems as noted below... she is HOH and had a cochlear implant yrs ago... she is 79 y/o and rather frail (lives w/ her daughter & her family)...  ~  July 03, 2011:  4614mo ROV & she reports stable; we reviewed meds but she didn't bring her bottles> she notes that DrSethi changed her Aggrenox (too $$) to a coated ASA 325mg /d & she's been stable w/o cerebral ischemic symptoms;  She has hx recurrent bronchitic infections in the past but no recent problems;  BP controlled on Aten/Hct;  Chol stable on Simva20 but needs better diet;  Etc...    She saw DrDBrodie 3/12 for GI eval due to heme pos stools> Colonoscopy showed Hems otherw neg, & rec hi fiber diet; She also has hx remote Nissen & EGD showed mild gastitis- REC to take Prilosec vs Pepcid...     She also saw DrHa for Oncology f/u 4/12 & stable on Arimidex...    CXR today shows ?12mm nodule right lung base> ?assoc w/ ant tip of rib? We will sched CT Chest ==> CT 8/12 showed min patchy atx, NO NODULE seen on CT, no adenopathy, s/p left mastectomy, DJD spine...  ~  January 04, 2012:  4614mo ROV & she has mult somatic complaints> tired, back pain, HAs, sl dizzy on occas, wants handicap sticker- ok; offered pain meds etc but she declines "I use Tylenol"    She saw DrHa 1/13 for f/u breast cancer on Arimidex since 11/10> note reviewed: +invasion & 1/3 nodes; neg mammogram 10/12 at [facility]; BMD 9/12 here was neg w/o osteoporosis; no changes made...    She saw DrHIngram 12/12 for surg f/u & he released her to [facility] care, stable, no known recurrence, f/u prn...    We reviewed lab summary & prev Imaging> labs done 1/13 showed Creat 1.7, Hg 11.0;  She had BMD 9/12 w/ TScores +6.4 in Spine and -0.7 in left FemNeck...  ~  August 26, 2012:  5mo ROV & Alison Cordova notes "I'm getting old, slowing down" but generally  stable & only c/o constip & we discussed Miralax, Senakot-S, etc...     She had Urology f/u DrMacDiarmid 8/13> urge incont, pt c/o "I'm leaking"; w/u revealed clear urine, bladder scan w/ 50cc residual, neg cysto; placed on Toviaz 4mg /d & pt states improved...    She had Oncology f/u w/ Lenis Noon 7/13> breast cancer (surg 11/10, on Arimidex since then), anemia, renal insuffic> doing well, no evid mets, he does labs every 614mo...    We reviewed prob list, meds, xrays and labs> see below for updates >> OK Flu vaccine today. LABS 7/13:  Chems- ok w/ BUN=21 Creat=1.46;  CBC- Hg=10.5, Fe=81, SPE/IEP=ok... LABS 10/13:  Chems- ok w/ Creat=1.6  ~  February 21, 2013:  4614mo ROV & Alison Cordova had a great grandson recently born & all 4 generations are living in her home; We reviewed the following medical problems during today's office visit >>     Ophthalmology> hx glaucoma, central retinal vein occlusion, macular edema & hemorrhage; followed by DrHollander & Allyne Gee...    AR, AB> on Zyrtek, Flonase; she has mid springtime allergy symptoms...    HBP> on Tenoretic50/25, Lisin20, K20; BP= 130/70 & she denies CP, palpit, dizzy, SOB, edema, etc...    Cerebrovasc Dis> on ASA325;  she denies any cerebral ischemic symptoms...    CHOL> on Simva20 & FishOil; FLP 4/14 shows TChol 191, TG 139, HDL 63, LDL 100    Hypothy> on Synthroid50; labs 4/14 showed TSH= 1.42    GI- GERD, Divertics, IBS, Hems> on Pepcid20; followed by DrDBrodie & stable, last colon 3/12 was neg x hems...    GU- Urinary Incont> on Toviaz4; followed by Urology & improved; labs 4/14 showed Cr= 1.4    Hx breast cancer> on Arimidex1mg ; followed by [facility]- seen 1/14 & doing satis on Arimidex since 11/10, no evid recurrence etc...    DJD> on OTC analgesics as needed; no evid of osteoporosis on BMDs from Oncology.Marland Kitchen    Hx Stroke, HAs, Neuropathy> stable on her current therapy...    Anxiety, Depression> on Pamelor10 & Luvox100 per DrPittman, Psychiatry... We reviewed prob  list, meds, xrays and labs> see below for updates >>  CXR 4/14 showed borderline heart size, tortuousAo, clear lungs, mod thor spondy, NAD... LABS 4/14:  FLP- at goals on Simva20;  Chems- ok w/ Cr=1.4;  CBC- ok w/ Hg=12.2;  TSH=1.42;  VitD=59...           Problem List:  GLAUCOMA (ICD-365.9) - followed by DrHollander on gtts... ~  5/10: sent to retina spec & saw DrSanders 5/10 w/ macular edema & hemorrhage- s/p laser Rx x2. ~  subseq started on series of AVASTIN shots in the eyes that seem to be helping.  ALLERGIC RHINITIS (ICD-477.9) - on OTC Zyretek, & FLONASE...  BRONCHITIS, RECURRENT (ICD-491.9) - she is an ex-smoker... she had hx of LLL pneumonia in 2000... she denies cough, sputum, change in SOB, etc... ~  CXR 3/11 showed RML scarring, otherw clear lungs, DJD sp, left mastectomy... ~  CXR 8/12 showed ?12mm RLL nodule (near an ant rib tip) ==> CTChest 8/12 confirmed no lung nodule present, no adenopathy, calcif Ao arch, left mastectomy.  HYPERTENSION (ICD-401.9) - on ATENOLOL/Hct 50-25 daily + K20/d & LISINOPRIL 20mg /d...   ~  8/12: BP=136/64 today and similar on home checks... she denies CP, palpit, change in SOB...  ~  2/13: BP= 114/64 & she remains asymptomatic... ~  10/13:  BP= 144/70 & she denies CP, palpit, ch in SOB, edema... ~  4/14: on Tenoretic50/25, Lisin20, K20; BP= 130/70 & she denies CP, palpit, dizzy, SOB, edema, etc.   CEREBROVASCULAR DISEASE (ICD-437.9) - Hosp 7/11 w/ TIA (right facial numbness & tingling which resolved spont)...  ~  MRI 7/11w/o acute change- atrophy, sm vessel dis, scat lacunes, CSpine dis;  MRA showed mild to mod atherosclerotic changes, 50% left vertebral stenosis, ?3mm aneurysmal dil right paraophthalmic art... followed by DrSethi> initially on study drug, then switched to AGGRENOX Bid, but she refused due to cost, then changed to ASA 325mg /d.  HYPERCHOLESTEROLEMIA (ICD-272.0) - on SIMVASTATIN 20mg /d + Fish Oil daily... ~  FLP 4/08 on Simva20  showed TChol 178, TG 171, HDL 71, LDL 73 ~  FLP 4/09 showed TChol 222, TG 112, HDL 109, LDL 95... continue same- GREAT HDL!!! ~  FLP 3/10 on Simva20 showed TChol 179, TG 176, HDL 81, LDL 63... continue same. ~  FLP 9/10 on Simva 20 showed TChol 182, TG 174, HDL 77, LDL 70 ~  FLP 3/11 on Simva20 showed TChol 199, TG 139, HDL 88, LDL 84 ~  FLP 7/11 in hosp on Simva20 showed TChol 180, TG 186, HDL 60, LDL 83 ~  FLP 2/12 on Simva20 showed TChol 217, TG 163, HDL 68, LDL 128.Marland KitchenMarland Kitchen  reminded about diet + med. ~  FLP 2/13 on Simva20 showed TChol 205, TG 115, HDL 70, LDL 116 ~  4/14: on Simva20 & FishOil; FLP 4/14 shows TChol 191, TG 139, HDL 63, LDL 100  HYPOTHYROIDISM (ICD-244.9) - on SYNTHROID 50mcg/d... ~  labs 4/08 showed TSH = 0.33 ~  labs 4/09 showed TSH= 1.33 ~  labs 3/10 showed TSH= 1.42 ~  labs 3/11 showed TSH= 1.59 ~  labs 7/11 in hosp showed TSH= 1.63 ~  labs 2/12 on Synthroid50 showed TSH= 1.89 ~  Labs 2/13 on Synthroid50 showed TSH= 1.25 ~  4/14: on Synthroid50; labs 4/14 showed TSH= 1.42   GERD (ICD-530.81) - she has noted some GERD symptoms: rec> Pepcid 20mg  OTC daily but she uses Prn. ~  EGD 3/12 by DrDBrodie showed evid of prev fundoplication, mild gastitis... DIVERTICULOSIS OF COLON (ICD-562.10) -  ~  Colonoscopy 11/06 by DrBrodie was normal, no change from 2000... IRRITABLE BOWEL SYNDROME (ICD-564.1) HEMORRHOIDS >> heme pos stool 3/12 was evaluated by DrDBrodie & determined to be due to Hems... ~  Colonoscopy 3/12 was neg x internal hemorrhoids  Hx of URINARY INCONTINENCE (ICD-788.30) - she saw DrPeterson and had urodynamic studies in 2008... she wears pads. RENAL INSUFFICIENCY >> ~  Labs 4/12 showed BUN=30, Creat=1.53 ~  Labs 8/12 showed BUN=29, Creat-1.66 ~  Labs 1/13 showed BUN=32, Creat=1.71, reminded to stay well hydrated. ~  Labs 10/13 showed BUN=28, Creat=1.6 ~  Labs 4/14 showed BUN=24, Cr=1.4  ADENOCARCINOMA, LEFT BREAST (ICD-174.9) - diagnosed w/ left breast  adenocarcinoma 11/10 w/ total left mastectomy, sentinel node bx (micro mets in 1/3 nodes), & adjuvant hormonal therapy w/ ANASTROZOLE 1mg /d followed by Lenis Noon since 11/10. ~  She saw DrHIngram 12/12 for surg f/u & he released her to [facility] care, stable, no known recurrence, f/u prn... ~  She saw DrHa 1/13 for f/u breast cancer on Arimidex since 11/10> note reviewed +invasion & 1/3 nodes; neg mammogram 10/12 at [facility]; BMD 9/12 here was neg w/o osteoporosis; no changes made... ~  She saw DrHa 7/13> breast cancer (surg 11/10, on Arimidex since then), anemia, renal insuffic> doing well, no evid mets, he does labs every 51mo... ~  She saw Oncology 1/14> stable on Arimidex1mg  since 11/10, continue same...  DEGENERATIVE JOINT DISEASE, GENERALIZED >> Aware, she declines offeres for pain meds & prefers to use Tylenol prn... ~  BMD here 9/12 showed TScores +6.4 in Spine and -0.7 in left FemNeck;  rec Calcium, MVI, VitD...  CVA (ICD-434.91) - on ASA 325mg /d and then POINT Study drug (Placebo vs Plavix)> then AGGRENOX Bid per neuro but she couldn't afford & switched to ASA 325mg /d. ~  she was seen by Autumn Patty in 2001 w/ studies showing bilat sm vessel ischemic strokes... ~  [facility] 7/11 w/ TIA << SEE ABOVE >>  HEADACHE (ICD-784.0) Hx of PERIPHERAL NEUROPATHY (ICD-356.9) - takes NORTRIPTYLINE 10mg - 2tabsQhs (per Psyche)... c/o her feet bothering her at night...   DYSTHYMIA (ICD-300.4) - on LUVOX + NORTRIPTYLINE per Psychiatry, DrPamPittman... incr stress- 56y/o son w/ MI...  ANEMIA (ICD-285.9) ~  abs 4/09 showed Hg= 13.7 ~  labs 3/11 showed Hg= 12.6 ~  labs 7/11 in hosp showed Hg= 12.6 ~  labs 2/12 showed Hg= 12.6 ~  Labs 1/13 showed Hg= 11.0, MCV= 97, & she was rec to take 1-a-day w/ Fe... ~  Labs 7/13 showed Hg= 10.5 w/ neg anemia w/u by Lenis Noon...  Health Maintenance - GYN = prev DrMcPail, but saw new GYN 2011 &  told no further evals needed... Mammograms at [facility] (Mammogram 10/10 w/ left breast mass-  referred to [facility] & Bx pos for infiltrating ductal cancer)...  BMD here 9/10 showed TScores +5.3 in spine & -0.2 in [facility]... Vit D level 4/09 was 37 & Rx w/ OTC 1000 u daily... she gets yearly Flu shots;  had Pneumovax 61yrs ago;  given TDAP 3/11.   Past Surgical History  Procedure Laterality Date  . Vaginal hysterectomy    . Spine surgery    .  fundiplication    . Eye surgery      TO DESOLVE AN AREA IN HER LEFT EYE?  . Breast surgery      left breast mastectomy     Outpatient Encounter Prescriptions as of 02/21/2013  Medication Sig Dispense Refill  . acetaminophen (TYLENOL) 650 MG CR tablet Take 650 mg by mouth every 8 (eight) hours as needed.        Marland Kitchen anastrozole (ARIMIDEX) 1 MG tablet Take 1 tablet (1 mg total) by mouth daily.  90 tablet  3  . aspirin 325 MG tablet Take 325 mg by mouth daily.        Marland Kitchen atenolol-chlorthalidone (TENORETIC) 50-25 MG per tablet Take 1 tablet by mouth daily.  90 tablet  3  . cetirizine (ZYRTEC) 10 MG tablet Take 10 mg by mouth daily.        . famotidine (PEPCID) 20 MG tablet Take 1 tablet (20 mg total) by mouth 2 (two) times daily as needed.  180 tablet  3  . fesoterodine (TOVIAZ) 4 MG TB24 Take 4 mg by mouth daily.      . fish oil-omega-3 fatty acids 1000 MG capsule Take 2 g by mouth daily.        . fluocinonide cream (LIDEX) 0.05 % Apply topically 2 (two) times daily.  90 g  3  . fluticasone (FLONASE) 50 MCG/ACT nasal spray Place 2 sprays into the nose daily.  48 g  3  . fluvoxaMINE (LUVOX) 100 MG tablet Take as directed by Dr. Raquel James      . latanoprost (XALATAN) 0.005 % ophthalmic solution Place 1 drop into both eyes at bedtime.        Marland Kitchen levothyroxine (SYNTHROID, LEVOTHROID) 50 MCG tablet Take 1 tablet (50 mcg total) by mouth daily.  90 tablet  3  . lisinopril (PRINIVIL,ZESTRIL) 20 MG tablet Take 1 tablet (20 mg total) by mouth daily.  90 tablet  3  . Multiple Vitamins-Minerals (WOMENS MULTIVITAMIN PLUS PO) Take 1 tablet by mouth daily.        .  nortriptyline (PAMELOR) 10 MG capsule Take 10 mg by mouth at bedtime.        . potassium chloride SA (K-DUR,KLOR-CON) 20 MEQ tablet Take 1 tablet (20 mEq total) by mouth daily.  90 tablet  3  . simvastatin (ZOCOR) 20 MG tablet Take 1 tablet (20 mg total) by mouth at bedtime.  90 tablet  3  . timolol (BETIMOL) 0.5 % ophthalmic solution Place 1 drop into both eyes 2 (two) times daily.      Marland Kitchen tobramycin (TOBREX) 0.3 % ophthalmic ointment Place 1 application into the left eye as needed.       No facility-administered encounter medications on file as of 02/21/2013.    No Known Allergies   Current Medications, Allergies, Past Medical History, Past Surgical History, Family History, and Social History were reviewed in [employer] record.    Review of Systems  See HPI - all other systems neg except as noted...  The patient complains of dyspnea on exertion.  The patient denies anorexia, fever, weight loss, weight gain, vision loss, decreased hearing, hoarseness, chest pain, syncope, peripheral edema, prolonged cough, headaches, hemoptysis, abdominal pain, melena, hematochezia, severe indigestion/heartburn, hematuria, incontinence, muscle weakness, suspicious skin lesions, transient blindness, difficulty walking, depression, unusual weight change, abnormal bleeding, enlarged lymph nodes, and angioedema.     Objective:   Physical Exam     WD, Thin, 79 y/o WF in NAD... she is chr ill appearing...  GENERAL:  Alert & oriented; pleasant & cooperative... HEENT:  [place]/AT, EACs-clear, TMs-wnl, Cochlear implant, NOSE-clear, THROAT-clear & wnl. NECK:  Supple w/ fairROM; no JVD; normal carotid impulses w/o bruits; no thyromegaly or nodules palpated; no lymphadenopathy. CHEST:  s/p left mastectomy, clear lungs w/o wheezing, rales, or rhonchi... HEART:  Regular Rhythm;  g 1/6 SEM without rubs or gallops... ABDOMEN:  Soft & nontender; normal bowel sounds; no organomegaly or masses  detected. EXT: without deformities, mod arthritic changes; no varicose veins/ +venous insuffic/ no edema.  NEURO:  CN's intact;  no asymmetry;  no focal neuro deficits... DERM:  No lesions noted; no rash etc...  RADIOLOGY DATA:  Reviewed in the EPIC EMR & discussed w/ the patient> see above...  LABORATORY DATA:  Reviewed in the EPIC EMR & discussed w/ the patient> see above...   Assessment & Plan:    Hx recurrent bronchitis/ no RLL nodule on CT>  CXR 8/12 suggested poss RLL nodule, but CT confirmed it was just the ant portion of right 6th rib (costochondral junction); no other lesions identified on CT...  Breathing well, no recurrent bronchitic episode, etc...  HBP>  Controlled on meds, continue same...  CEREBROVASC Dis/ TIA>  As noted she is currently taking ASA 325mg /d & has not had any cerebral ischemic symptoms.  CHOL>  On Simva20 + diet & reminded to take med regularly...  HYPOTHYROID>  Stable on Synthroid 65mcg/d...  GI>  Gastitis, Hx NissenFundoplication, Divertics, IBS, Hems>  3/12 GI eval DrDBrodie reviewed- BRB in stool [facility] from [facility] & rx by GI...  Renal Insuffic>  Stable renal insuffic w/ Creat= 1.6-1.7 range; she knows to avoid NSAIDs etc...  Breast Cancer>  Followed by Lenis Noon on Armidex & stable...  DJD>  She uses OTC anti-inflamm as needed...  DYSTHYMIA>  Followed by DrPPittman on Luvox & Nortriptyline...   Patient's Medications  New Prescriptions   No medications on file  Previous Medications   ACETAMINOPHEN (TYLENOL) 650 MG CR TABLET    Take 650 mg by mouth every 8 (eight) hours as needed.     ANASTROZOLE (ARIMIDEX) 1 MG TABLET    Take 1 tablet (1 mg total) by mouth daily.   ASPIRIN 325 MG TABLET    Take 325 mg by mouth daily.     ATENOLOL-CHLORTHALIDONE (TENORETIC) 50-25 MG PER TABLET    Take 1 tablet by mouth daily.   CETIRIZINE (ZYRTEC) 10 MG TABLET    Take 10 mg by mouth daily.     FAMOTIDINE (PEPCID) 20 MG TABLET    Take 1 tablet (20 mg total) by mouth  2 (two) times daily as needed.   FESOTERODINE (TOVIAZ) 4 MG TB24    Take 4 mg by mouth daily.   FISH OIL-OMEGA-3 FATTY ACIDS 1000 MG CAPSULE    Take 2 g by mouth daily.     FLUOCINONIDE CREAM (LIDEX) 0.05 %    Apply topically 2 (two) times daily.  FLUTICASONE (FLONASE) 50 MCG/ACT NASAL SPRAY    Place 2 sprays into the nose daily.   FLUVOXAMINE (LUVOX) 100 MG TABLET    Take as directed by Dr. Raquel James   LATANOPROST (XALATAN) 0.005 % OPHTHALMIC SOLUTION    Place 1 drop into both eyes at bedtime.     LEVOTHYROXINE (SYNTHROID, LEVOTHROID) 50 MCG TABLET    Take 1 tablet (50 mcg total) by mouth daily.   LISINOPRIL (PRINIVIL,ZESTRIL) 20 MG TABLET    Take 1 tablet (20 mg total) by mouth daily.   MULTIPLE VITAMINS-MINERALS (WOMENS MULTIVITAMIN PLUS PO)    Take 1 tablet by mouth daily.     NORTRIPTYLINE (PAMELOR) 10 MG CAPSULE    Take 10 mg by mouth at bedtime.     POTASSIUM CHLORIDE SA (K-DUR,KLOR-CON) 20 MEQ TABLET    Take 1 tablet (20 mEq total) by mouth daily.   SIMVASTATIN (ZOCOR) 20 MG TABLET    Take 1 tablet (20 mg total) by mouth at bedtime.   TIMOLOL (BETIMOL) 0.5 % OPHTHALMIC SOLUTION    Place 1 drop into both eyes 2 (two) times daily.   TOBRAMYCIN (TOBREX) 0.3 % OPHTHALMIC OINTMENT    Place 1 application into the left eye as needed.  Modified Medications   No medications on file  Discontinued Medications   No medications on file

## 2013-03-01 ENCOUNTER — Telehealth: Payer: Self-pay | Admitting: Oncology

## 2013-03-01 NOTE — Telephone Encounter (Signed)
s.w. pt daughter and advised on move appt to 7.14.14...ok and aware

## 2013-05-04 ENCOUNTER — Telehealth: Payer: Self-pay | Admitting: Oncology

## 2013-05-04 NOTE — Telephone Encounter (Signed)
Talked to pt gave her appt for 7/14 lab and MD

## 2013-05-18 ENCOUNTER — Other Ambulatory Visit: Payer: Self-pay | Admitting: *Deleted

## 2013-06-05 ENCOUNTER — Other Ambulatory Visit (HOSPITAL_BASED_OUTPATIENT_CLINIC_OR_DEPARTMENT_OTHER): Payer: Medicare Other | Admitting: Lab

## 2013-06-05 ENCOUNTER — Other Ambulatory Visit: Payer: Medicare Other | Admitting: Lab

## 2013-06-05 ENCOUNTER — Telehealth: Payer: Self-pay | Admitting: Oncology

## 2013-06-05 ENCOUNTER — Ambulatory Visit: Payer: Medicare Other | Admitting: Oncology

## 2013-06-05 ENCOUNTER — Ambulatory Visit (HOSPITAL_BASED_OUTPATIENT_CLINIC_OR_DEPARTMENT_OTHER): Payer: Medicare Other | Admitting: Oncology

## 2013-06-05 VITALS — BP 163/69 | HR 68 | Temp 98.0°F | Resp 18 | Ht <= 58 in | Wt 116.2 lb

## 2013-06-05 DIAGNOSIS — C50919 Malignant neoplasm of unspecified site of unspecified female breast: Secondary | ICD-10-CM

## 2013-06-05 LAB — CBC WITH DIFFERENTIAL/PLATELET
BASO%: 0.7 % (ref 0.0–2.0)
Basophils Absolute: 0 10*3/uL (ref 0.0–0.1)
EOS%: 4.8 % (ref 0.0–7.0)
MCH: 33.2 pg (ref 25.1–34.0)
MCHC: 34.8 g/dL (ref 31.5–36.0)
MCV: 95.5 fL (ref 79.5–101.0)
MONO%: 20.5 % — ABNORMAL HIGH (ref 0.0–14.0)
RDW: 13.4 % (ref 11.2–14.5)
lymph#: 1.5 10*3/uL (ref 0.9–3.3)

## 2013-06-05 NOTE — Progress Notes (Signed)
[facility]  [phone] [phone]   OFFICE PROGRESS NOTE   Cc:  Michele Mcalpine, MD  DIAGNOSIS AND PAST THERAPY:   History of a left breast 2.2cm, grade 2 invasive ductal carcinoma, status post simple mastectomy on September 26, 2009 without angiolymphatic invasions or perineural invasion. Margin of the invasive component was 0.2 cm from the deep margin and in situ component of 0.5 cm from the deep margin; ER 96%, PR 100%, Ki-67 26%, HER2/neu by CISH was negative for ratio of 0.81. One out of 3 nodes with micrometastatic disease.   CURENT THERAPY: adjuvant hormonal therapy Arimidex started in November 2010.  INTERVAL HISTORY: Alison Cordova 79 y.o. female returns for regular follow up with her daughter.  She reports feeling well.  She denies palpable breast mass, bone/muscle pain, headache, back pain, bowel/bladder incontinence, bleeding, neuropathy, skin rash, hot flash.  The rest of the 14-point review of system was negative.   Past Medical History  Diagnosis Date  . Allergy     SEASONAL  . Depression   . Anxiety   . Arthritis   . Cancer     LEFT BREAST CANCER  . Cataract   . Stroke   . Glaucoma   . Hyperlipidemia   . Hypertension   . Thyroid disease   . Anemia 06/17/2012  . Chronic kidney disease (CKD), stage II (mild) 06/17/2012    Past Surgical History  Procedure Laterality Date  . Vaginal hysterectomy    . Spine surgery    .  fundiplication    . Eye surgery      TO DESOLVE AN AREA IN HER LEFT EYE?  . Breast surgery      left breast mastectomy     Current Outpatient Prescriptions  Medication Sig Dispense Refill  . acetaminophen (TYLENOL) 650 MG CR tablet Take 650 mg by mouth every 8 (eight) hours as needed.        Marland Kitchen anastrozole (ARIMIDEX) 1 MG tablet Take 1 tablet (1 mg total) by mouth daily.  90 tablet  3  . aspirin 325 MG tablet Take 325 mg by mouth daily.        Marland Kitchen atenolol-chlorthalidone (TENORETIC) 50-25 MG per tablet Take 1  tablet by mouth daily.  90 tablet  3  . cetirizine (ZYRTEC) 10 MG tablet Take 10 mg by mouth daily.        . famotidine (PEPCID) 20 MG tablet Take 1 tablet (20 mg total) by mouth 2 (two) times daily as needed.  180 tablet  3  . fesoterodine (TOVIAZ) 4 MG TB24 Take 4 mg by mouth daily.      . fish oil-omega-3 fatty acids 1000 MG capsule Take 2 g by mouth daily.        . fluocinonide cream (LIDEX) 0.05 % Apply topically 2 (two) times daily.  90 g  3  . fluticasone (FLONASE) 50 MCG/ACT nasal spray Place 2 sprays into the nose daily.  48 g  3  . fluvoxaMINE (LUVOX) 100 MG tablet Take as directed by Dr. Raquel James      . latanoprost (XALATAN) 0.005 % ophthalmic solution Place 1 drop into both eyes at bedtime.        Marland Kitchen levothyroxine (SYNTHROID, LEVOTHROID) 50 MCG tablet Take 1 tablet (50 mcg total) by mouth daily.  90 tablet  3  . lisinopril (PRINIVIL,ZESTRIL) 20 MG tablet Take 1 tablet (20 mg total) by mouth daily.  90 tablet  3  . Multiple  Vitamins-Minerals (WOMENS MULTIVITAMIN PLUS PO) Take 1 tablet by mouth daily.        . NON FORMULARY Post Surgical Bras      . NON FORMULARY Non-Silicone Breast Prosthesis      . NON FORMULARY Silicone Breast Prosthesis      . nortriptyline (PAMELOR) 10 MG capsule Take 10 mg by mouth at bedtime.        . potassium chloride SA (K-DUR,KLOR-CON) 20 MEQ tablet Take 1 tablet (20 mEq total) by mouth daily.  90 tablet  3  . simvastatin (ZOCOR) 20 MG tablet Take 1 tablet (20 mg total) by mouth at bedtime.  90 tablet  3  . timolol (BETIMOL) 0.5 % ophthalmic solution Place 1 drop into both eyes 2 (two) times daily.      Marland Kitchen tobramycin (TOBREX) 0.3 % ophthalmic ointment Place 1 application into the left eye as needed.       No current facility-administered medications for this visit.    ALLERGIES:  has No Known Allergies.  REVIEW OF SYSTEMS:  The rest of the 14-point review of system was negative.   Filed Vitals:   06/05/13 0829  BP: 163/69  Pulse: 68  Temp: 98 F  (36.7 C)  Resp: 18   Wt Readings from Last 3 Encounters:  06/05/13 116 lb 3.2 oz (52.708 kg)  02/21/13 114 lb 3.2 oz (51.801 kg)  12/19/12 111 lb 11.2 oz (50.667 kg)   ECOG Performance status: 1-2  PHYSICAL EXAMINATION:   General: Thin and frail appearing woman in no acute distress. Eyes: no scleral icterus. ENT: There were no oropharyngeal lesions. Neck was without thyromegaly. Lymphatics: Negative cervical, supraclavicular or axillary adenopathy. Respiratory: lungs were clear bilaterally without wheezing or crackles. Cardiovascular: Regular rate and rhythm, S1/S2, without murmur, rub or gallop. There was no pedal edema. GI: abdomen was soft, flat, nontender, nondistended, without organomegaly.  Muscoloskeletal: no spinal tenderness of palpation of vertebral spine. Skin exam was without echymosis, petichae. Neuro exam was nonfocal. Patient was able to get on and off exam table without assistance. Gait was normal. Patient was alert and oriented. Attention was good. Language was appropriate. Mood was normal without depression. Speech was not pressured. Thought content was not tangential. Bilateral breast exam showed left mastectomy scar without nodule, thickening. Right breast was negative.     LABORATORY/RADIOLOGY DATA:  Lab Results  Component Value Date   WBC 4.8 06/05/2013   HGB 11.2* 06/05/2013   HCT 32.2* 06/05/2013   PLT 259 06/05/2013   GLUCOSE 96 02/21/2013   CHOL 191 02/21/2013   TRIG 139.0 02/21/2013   HDL 63.20 02/21/2013   LDLDIRECT 115.9 01/04/2012   LDLCALC 100* 02/21/2013   ALKPHOS 43 02/21/2013   ALT 14 02/21/2013   AST 20 02/21/2013   NA 141 02/21/2013   K 4.3 02/21/2013   CL 102 02/21/2013   CREATININE 1.4* 02/21/2013   BUN 24* 02/21/2013   CO2 27 02/21/2013   INR 0.98 06/08/2010   HGBA1C  Value: 5.6 (NOTE)                                                                       According to the ADA Clinical Practice Recommendations for 2011, when HbA1c is used  as a screening test:   >=6.5%    Diagnostic of Diabetes Mellitus           (if abnormal result  is confirmed)  5.7-6.4%   Increased risk of developing Diabetes Mellitus  References:Diagnosis and Classification of Diabetes Mellitus,Diabetes Care,2011,34(Suppl 1):S62-S69 and Standards of Medical Care in         Diabetes - 2011,Diabetes Care,2011,34  (Suppl 1):S11-S61. 06/08/2010     ASSESSMENT AND PLAN:   1. History of breast cancer. Continue to be in remission. Next mammogram is due in October 2014. Bone density in 07/2011 did not showed osteoporosis.; he next one is due in Oct 2014. I encouraged her to continue Arimidex for 5 years total.  At the 5 year mark, we can discuss whether to continue for 10 years total or stop. I advised her to continue Cal/Vitamin D to decrease risk of osteoporosis.  2. Hypertension. She is on atenolol/chlorthalidone, lisinopril per PCP. 3. Hyperlipidemia. She is on simvastatin per PCP. 4. History of CVA. She is on aspirin, simvastatin and beta blocker per PCP. 5. Hypothyroidism. She is on levothyroxine per PCP. 6. Renal insufficiency: most likely due to chronic HTN.  Work up in the past was negative for myeloma. Will continue to monitor.  7. Normocytic anemia: most likely anemia of CKD.  Work up in the past including iron panel, endoscopy, SPEP were negative.  Will continue to monitor.  8. Followup. In about 6 months.    I informed Ms. Bhatnagar that I am leaving the practice.  [facility] to see another provider when she returns.    The length of time of the face-to-face encounter was 15 minutes. More than 50% of time was spent counseling and coordination of care.    Alison Cordova, M.D.

## 2013-06-05 NOTE — Telephone Encounter (Signed)
gv and printed appt sched and avs for pt...pt sched with [facility] for 10.20.14 @ 10:15am

## 2013-06-19 ENCOUNTER — Ambulatory Visit: Payer: Medicare Other | Admitting: Oncology

## 2013-06-19 ENCOUNTER — Other Ambulatory Visit: Payer: Medicare Other | Admitting: Lab

## 2013-08-23 ENCOUNTER — Encounter: Payer: Self-pay | Admitting: Pulmonary Disease

## 2013-08-23 ENCOUNTER — Ambulatory Visit (INDEPENDENT_AMBULATORY_CARE_PROVIDER_SITE_OTHER): Payer: Medicare Other | Admitting: Pulmonary Disease

## 2013-08-23 VITALS — BP 128/80 | HR 74 | Temp 97.5°F | Ht <= 58 in | Wt 116.8 lb

## 2013-08-23 DIAGNOSIS — I679 Cerebrovascular disease, unspecified: Secondary | ICD-10-CM

## 2013-08-23 DIAGNOSIS — K5900 Constipation, unspecified: Secondary | ICD-10-CM

## 2013-08-23 DIAGNOSIS — M159 Polyosteoarthritis, unspecified: Secondary | ICD-10-CM

## 2013-08-23 DIAGNOSIS — D649 Anemia, unspecified: Secondary | ICD-10-CM

## 2013-08-23 DIAGNOSIS — E7800 Pure hypercholesterolemia, unspecified: Secondary | ICD-10-CM

## 2013-08-23 DIAGNOSIS — K5730 Diverticulosis of large intestine without perforation or abscess without bleeding: Secondary | ICD-10-CM

## 2013-08-23 DIAGNOSIS — I6350 Cerebral infarction due to unspecified occlusion or stenosis of unspecified cerebral artery: Secondary | ICD-10-CM

## 2013-08-23 DIAGNOSIS — E039 Hypothyroidism, unspecified: Secondary | ICD-10-CM

## 2013-08-23 DIAGNOSIS — K589 Irritable bowel syndrome without diarrhea: Secondary | ICD-10-CM

## 2013-08-23 DIAGNOSIS — N182 Chronic kidney disease, stage 2 (mild): Secondary | ICD-10-CM

## 2013-08-23 DIAGNOSIS — F341 Dysthymic disorder: Secondary | ICD-10-CM

## 2013-08-23 DIAGNOSIS — R32 Unspecified urinary incontinence: Secondary | ICD-10-CM

## 2013-08-23 DIAGNOSIS — C50919 Malignant neoplasm of unspecified site of unspecified female breast: Secondary | ICD-10-CM

## 2013-08-23 DIAGNOSIS — I10 Essential (primary) hypertension: Secondary | ICD-10-CM

## 2013-08-23 DIAGNOSIS — K219 Gastro-esophageal reflux disease without esophagitis: Secondary | ICD-10-CM

## 2013-08-23 DIAGNOSIS — G609 Hereditary and idiopathic neuropathy, unspecified: Secondary | ICD-10-CM

## 2013-08-23 NOTE — Progress Notes (Signed)
Subjective:    Patient ID: Alison Cordova, female    DOB: [birth year], 79 y.o.   MRN: [id number]  HPI 79 y/o WF here for a follow up visit... she has mult med problems as noted below... she is HOH and had a cochlear implant yrs ago... she is 79 y/o and rather frail (lives w/ her daughter & her family)...  ~  July 03, 2011:  87mo ROV & she reports stable; we reviewed meds but she didn't bring her bottles> she notes that DrSethi changed her Aggrenox (too $$) to a coated ASA 325mg /d & she's been stable w/o cerebral ischemic symptoms;  She has hx recurrent bronchitic infections in the past but no recent problems;  BP controlled on Aten/Hct;  Chol stable on Simva20 but needs better diet;  Etc...    She saw DrDBrodie 3/12 for GI eval due to heme pos stools> Colonoscopy showed Hems otherw neg, & rec hi fiber diet; She also has hx remote Nissen & EGD showed mild gastitis- REC to take Prilosec vs Pepcid...     She also saw DrHa for Oncology f/u 4/12 & stable on Arimidex...    CXR today shows ?12mm nodule right lung base> ?assoc w/ ant tip of rib? We will sched CT Chest ==> CT 8/12 showed min patchy atx, NO NODULE seen on CT, no adenopathy, s/p left mastectomy, DJD spine...  ~  January 04, 2012:  87mo ROV & she has mult somatic complaints> tired, back pain, HAs, sl dizzy on occas, wants handicap sticker- ok; offered pain meds etc but she declines "I use Tylenol"    She saw DrHa 1/13 for f/u breast cancer on Arimidex since 11/10> note reviewed: +invasion & 1/3 nodes; neg mammogram 10/12 at [facility]; BMD 9/12 here was neg w/o osteoporosis; no changes made...    She saw DrHIngram 12/12 for surg f/u & he released her to [facility] care, stable, no known recurrence, f/u prn...    We reviewed lab summary & prev Imaging> labs done 1/13 showed Creat 1.7, Hg 11.0;  She had BMD 9/12 w/ TScores +6.4 in Spine and -0.7 in left FemNeck...  ~  August 26, 2012:  67mo ROV & Alison Cordova notes "I'm getting old, slowing down" but generally  stable & only c/o constip & we discussed Miralax, Senakot-S, etc...     She had Urology f/u DrMacDiarmid 8/13> urge incont, pt c/o "I'm leaking"; w/u revealed clear urine, bladder scan w/ 50cc residual, neg cysto; placed on Toviaz 4mg /d & pt states improved...    She had Oncology f/u w/ Lenis Noon 7/13> breast cancer (surg 11/10, on Arimidex since then), anemia, renal insuffic> doing well, no evid mets, he does labs every 48mo...    We reviewed prob list, meds, xrays and labs> see below for updates >> OK Flu vaccine today. LABS 7/13:  Chems- ok w/ BUN=21 Creat=1.46;  CBC- Hg=10.5, Fe=81, SPE/IEP=ok... LABS 10/13:  Chems- ok w/ Creat=1.6  ~  February 21, 2013:  87mo ROV & Alison Cordova had a great grandson recently born & all 4 generations are living in her home; We reviewed the following medical problems during today's office visit >>     Ophthalmology> hx glaucoma, central retinal vein occlusion, macular edema & hemorrhage; followed by DrHollander & Allyne Gee...    AR, AB> on Zyrtek, Flonase; she has mid springtime allergy symptoms...    HBP> on Tenoretic50/25, Lisin20, K20; BP= 130/70 & she denies CP, palpit, dizzy, SOB, edema, etc...    Cerebrovasc Dis> on ASA325;  she denies any cerebral ischemic symptoms...    CHOL> on Simva20 & FishOil; FLP 4/14 shows TChol 191, TG 139, HDL 63, LDL 100    Hypothy> on Synthroid50; labs 4/14 showed TSH= 1.42    GI- GERD, Divertics, IBS, Hems> on Pepcid20; followed by DrDBrodie & stable, last colon 3/12 was neg x hems...    GU- Urinary Incont> on Toviaz4; followed by Urology & improved; labs 4/14 showed Cr= 1.4    Hx breast cancer> on Arimidex1mg ; followed by Riley Churches- seen 1/14 & doing satis on Arimidex since 11/10, no evid recurrence etc...    DJD> on OTC analgesics as needed; no evid of osteoporosis on BMDs from Oncology.Marland Kitchen    Hx Stroke, HAs, Neuropathy> stable on her current therapy...    Anxiety, Depression> on Pamelor10 & Luvox100 per DrPittman, Psychiatry... We reviewed prob  list, meds, xrays and labs> see below for updates >>  CXR 4/14 showed borderline heart size, tortuousAo, clear lungs, mod thor spondy, NAD... LABS 4/14:  FLP- at goals on Simva20;  Chems- ok w/ Cr=1.4;  CBC- ok w/ Hg=12.2;  TSH=1.42;  VitD=59...  ~  August 23, 2013:  19mo ROV & happy 87th birthday today!  Alison Cordova is stable 9 doing reasonably well for 87; she has mult somatic complaints and  A [employer] nurse calls her once per month to check on her; she doesn't get much exercise- "I walk all I can, and use a cane";  We reviewed the following medical problems during today's office visit >>     Ophthalmology> hx glaucoma, central retinal vein occlusion, macular edema & hemorrhage; followed by DrHollander & Allyne Gee on 3 diff eye drops & Lucentis shots...    AR, AB> on Zyrtek, Flonase; she has mid springtime allergy symptoms but does better in the fall; min dry cough, no sput, no SOB...    HBP> on Tenoretic50/25, Lisin20, K20; BP= 128/80 & she denies CP, palpit, dizzy, SOB, edema, etc...    Cerebrovasc Dis> on ASA325; she denies any cerebral ischemic symptoms...    CHOL> on Simva20 & FishOil; FLP 4/14 showed TChol 191, TG 139, HDL 63, LDL 100    Hypothy> on Synthroid50; labs 4/14 showed TSH= 1.42    GI- GERD, Divertics, IBS, Hems> on Pepcid20; followed by DrDBrodie & stable, last colon 3/12 was neg x hems; notes sl constip, Rx metamucil...    GU- Urinary Incont> on Toviaz4 & Myrbetriq25 per Urology; Labs 4/14 showed Cr= 1.4; voiding symptoms & incont are improved...    Hx breast cancer> on Arimidex1mg ; followed by Riley Churches- seen 1/14 & doing satis on Arimidex since 11/10, no evid recurrence etc, due for mammogram & BMD soon...    DJD> on OTC analgesics as needed; no evid of osteoporosis on BMDs from Oncology.Marland Kitchen    Hx Stroke, HAs, Neuropathy> stable on her current therapy...    Anxiety, Depression> on Pamelor10 & Luvox100 per DrPittman/Plovsky, Psychiatry... We reviewed prob list, meds, xrays and labs> see below  for updates >> OK FLU vaccine today...           Problem List:  GLAUCOMA (ICD-365.9) - followed by DrHollander on gtts... ~  5/10: sent to retina spec & saw DrSanders 5/10 w/ macular edema & hemorrhage- s/p laser Rx x2. ~  subseq started on series of AVASTIN/Lucentis shots in the eyes that seem to be helping. ~  6/14: she had f/u DrSanders> s/p shots for wet macular degen, vision is stable, they continue regular f/u...  ALLERGIC RHINITIS (ICD-477.9) -  on OTC Zyretek, & FLONASE...  BRONCHITIS, RECURRENT (ICD-491.9) - she is an ex-smoker... she had hx of LLL pneumonia in 2000... she denies cough, sputum, change in SOB, etc... ~  CXR 3/11 showed RML scarring, otherw clear lungs, DJD sp, left mastectomy... ~  CXR 8/12 showed ?12mm RLL nodule (near an ant rib tip) ==> CTChest 8/12 confirmed no lung nodule present, no adenopathy, calcif Ao arch, left mastectomy. ~  CXR 4/14 showed borderline cardiomeg, tortuous Ao, clear lungs, mod thor spondylosis, NAD...   HYPERTENSION (ICD-401.9) - on ATENOLOL/Hct 50-25 daily + K20/d & LISINOPRIL 20mg /d...   ~  8/12: BP=136/64 today and similar on home checks... she denies CP, palpit, change in SOB...  ~  2/13: BP= 114/64 & she remains asymptomatic... ~  10/13:  BP= 144/70 & she denies CP, palpit, ch in SOB, edema... ~  4/14: on Tenoretic50/25, Lisin20, K20; BP= 130/70 & she denies CP, palpit, dizzy, SOB, edema, etc.   CEREBROVASCULAR DISEASE (ICD-437.9) - Hosp 7/11 w/ TIA (right facial numbness & tingling which resolved spont)...  ~  MRI 7/11 w/o acute change- atrophy, sm vessel dis, scat lacunes, CSpine dis;  MRA showed mild to mod atherosclerotic changes, 50% left vertebral stenosis, ?3mm aneurysmal dil right paraophthalmic art... followed by DrSethi> initially on study drug, then switched to AGGRENOX Bid, but she refused due to cost, then changed to ASA 325mg /d.  HYPERCHOLESTEROLEMIA (ICD-272.0) - on SIMVASTATIN 20mg /d + Fish Oil daily... ~  FLP 4/08 on  Simva20 showed TChol 178, TG 171, HDL 71, LDL 73 ~  FLP 4/09 showed TChol 222, TG 112, HDL 109, LDL 95... continue same- GREAT HDL!!! ~  FLP 3/10 on Simva20 showed TChol 179, TG 176, HDL 81, LDL 63... continue same. ~  FLP 9/10 on Simva 20 showed TChol 182, TG 174, HDL 77, LDL 70 ~  FLP 3/11 on Simva20 showed TChol 199, TG 139, HDL 88, LDL 84 ~  FLP 7/11 in hosp on Simva20 showed TChol 180, TG 186, HDL 60, LDL 83 ~  FLP 2/12 on Simva20 showed TChol 217, TG 163, HDL 68, LDL 128... reminded about diet + med. ~  FLP 2/13 on Simva20 showed TChol 205, TG 115, HDL 70, LDL 116 ~  4/14: on Simva20 & FishOil; FLP 4/14 shows TChol 191, TG 139, HDL 63, LDL 100  HYPOTHYROIDISM (ICD-244.9) - on SYNTHROID 27mcg/d... ~  labs 4/08 showed TSH = 0.33 ~  labs 4/09 showed TSH= 1.33 ~  labs 3/10 showed TSH= 1.42 ~  labs 3/11 showed TSH= 1.59 ~  labs 7/11 in hosp showed TSH= 1.63 ~  labs 2/12 on Synthroid50 showed TSH= 1.89 ~  Labs 2/13 on Synthroid50 showed TSH= 1.25 ~  4/14: on Synthroid50; labs 4/14 showed TSH= 1.42   GERD (ICD-530.81) - she has noted some GERD symptoms: rec> Pepcid 20mg  OTC daily but she uses Prn. ~  EGD 3/12 by DrDBrodie showed evid of prev fundoplication, mild gastitis... DIVERTICULOSIS OF COLON (ICD-562.10) -  ~  Colonoscopy 11/06 by DrBrodie was normal, no change from 2000... IRRITABLE BOWEL SYNDROME (ICD-564.1) HEMORRHOIDS >> heme pos stool 3/12 was evaluated by DrDBrodie & determined to be due to Hems... ~  Colonoscopy 3/12 was neg x internal hemorrhoids  Hx of URINARY INCONTINENCE (ICD-788.30) - she saw DrPeterson and had urodynamic studies in 2008... she wears pads. RENAL INSUFFICIENCY >> ~  Labs 4/12 showed BUN=30, Creat=1.53 ~  Labs 8/12 showed BUN=29, Creat-1.66 ~  Labs 1/13 showed BUN=32, Creat=1.71, reminded to stay  well hydrated. ~  Labs 10/13 showed BUN=28, Creat=1.6 ~  Labs 4/14 showed BUN=24, Cr=1.4 ~  9/14: she had f/u DrMacDiarmid> freq & urge incont, mild renal  insuffic w/ Cr=1.4; added [id number] to the [id number];   ADENOCARCINOMA, LEFT BREAST (ICD-174.9) - diagnosed w/ left breast adenocarcinoma 11/10 w/ total left mastectomy, sentinel node bx (micro mets in 1/3 nodes), & adjuvant hormonal therapy w/ ANASTROZOLE 1mg /d followed by Lenis Noon since 11/10. ~  She saw DrHIngram 12/12 for surg f/u & he released her to [facility] care, stable, no known recurrence, f/u prn... ~  She saw DrHa 1/13 for f/u breast cancer on Arimidex since 11/10> note reviewed +invasion & 1/3 nodes; neg mammogram 10/12 at [facility]; BMD 9/12 here was neg w/o osteoporosis; no changes made... ~  She saw DrHa 7/13> breast cancer (surg 11/10, on Arimidex since then), anemia, renal insuffic> doing well, no evid mets, he does labs every 70mo... ~  She saw Oncology 1/14> stable on Arimidex1mg  since 11/10, continue same... ~  7/14: she had Oncology f/u w/ DrHa> she continues in remission; due for mammogram & BMD soon, on Arimidex1mg , Ca + VitD...  DEGENERATIVE JOINT DISEASE, GENERALIZED >> Aware, she declines offeres for pain meds & prefers to use Tylenol prn... ~  BMD here 9/12 showed TScores +6.4 in Spine and -0.7 in left FemNeck;  rec Calcium, MVI, VitD...  CVA (ICD-434.91) - on ASA 325mg /d and then POINT Study drug (Placebo vs Plavix)> then AGGRENOX Bid per neuro but she couldn't afford & switched to ASA 325mg /d. ~  she was seen by Autumn Patty in 2001 w/ studies showing bilat sm vessel ischemic strokes... ~  [facility] 7/11 w/ TIA << SEE ABOVE >>  HEADACHE (ICD-784.0) Hx of PERIPHERAL NEUROPATHY (ICD-356.9) - takes NORTRIPTYLINE 10mg - 2tabsQhs (per Psyche)... c/o her feet bothering her at night...   DYSTHYMIA (ICD-300.4) - on LUVOX + NORTRIPTYLINE per Psychiatry, DrPamPittman... incr stress- 56y/o son w/ MI...  ANEMIA (ICD-285.9) ~  abs 4/09 showed Hg= 13.7 ~  labs 3/11 showed Hg= 12.6 ~  labs 7/11 in hosp showed Hg= 12.6 ~  labs 2/12 showed Hg= 12.6 ~  Labs 1/13 showed Hg= 11.0, MCV= 97, & she was  rec to take 1-a-day w/ Fe... ~  Labs 7/13 showed Hg= 10.5 w/ neg anemia w/u by Lenis Noon... ~  Labs 7/14 showed Hg= 11.2 & DrHa feels she has ACD...  Health Maintenance - GYN = prev DrMcPail, but saw new GYN 2011 & told no further evals needed... Mammograms at [facility] (Mammogram 10/10 w/ left breast mass- referred to [facility] & Bx pos for infiltrating ductal cancer)...  BMD here 9/10 showed TScores +5.3 in spine & -0.2 in [facility]... Vit D level 4/09 was 37 & Rx w/ OTC 1000 u daily... she gets yearly Flu shots;  had Pneumovax 66yrs ago;  given TDAP 3/11.   Past Surgical History  Procedure Laterality Date  . Vaginal hysterectomy    . Spine surgery    .  fundiplication    . Eye surgery      TO DESOLVE AN AREA IN HER LEFT EYE?  . Breast surgery      left breast mastectomy     Outpatient Encounter Prescriptions as of 08/23/2013  Medication Sig Dispense Refill  . acetaminophen (TYLENOL) 650 MG CR tablet Take 650 mg by mouth every 8 (eight) hours as needed.        Marland Kitchen anastrozole (ARIMIDEX) 1 MG tablet Take 1 tablet (1 mg total) by mouth daily.  90 tablet  3  . aspirin 325 MG tablet Take 325 mg by mouth daily.        Marland Kitchen atenolol-chlorthalidone (TENORETIC) 50-25 MG per tablet Take 1 tablet by mouth daily.  90 tablet  3  . cetirizine (ZYRTEC) 10 MG tablet Take 10 mg by mouth daily.        . famotidine (PEPCID) 20 MG tablet Take 1 tablet (20 mg total) by mouth 2 (two) times daily as needed.  180 tablet  3  . fesoterodine (TOVIAZ) 4 MG TB24 Take 4 mg by mouth daily.      . fish oil-omega-3 fatty acids 1000 MG capsule Take 2 g by mouth daily.        . fluocinonide cream (LIDEX) 0.05 % Apply topically 2 (two) times daily.  90 g  3  . fluticasone (FLONASE) 50 MCG/ACT nasal spray Place 2 sprays into the nose daily.  48 g  3  . fluvoxaMINE (LUVOX) 100 MG tablet Take as directed by Dr. Raquel James      . latanoprost (XALATAN) 0.005 % ophthalmic solution Place 1 drop into both eyes at bedtime.        Marland Kitchen  levothyroxine (SYNTHROID, LEVOTHROID) 50 MCG tablet Take 1 tablet (50 mcg total) by mouth daily.  90 tablet  3  . lisinopril (PRINIVIL,ZESTRIL) 20 MG tablet Take 1 tablet (20 mg total) by mouth daily.  90 tablet  3  . mirabegron ER (MYRBETRIQ) 25 MG TB24 tablet Take 25 mg by mouth daily.      . Multiple Vitamins-Minerals (WOMENS MULTIVITAMIN PLUS PO) Take 1 tablet by mouth daily.        . NON FORMULARY Post Surgical Bras      . NON FORMULARY Non-Silicone Breast Prosthesis      . NON FORMULARY Silicone Breast Prosthesis      . nortriptyline (PAMELOR) 10 MG capsule Take 10 mg by mouth at bedtime.        . potassium chloride SA (K-DUR,KLOR-CON) 20 MEQ tablet Take 1 tablet (20 mEq total) by mouth daily.  90 tablet  3  . simvastatin (ZOCOR) 20 MG tablet Take 1 tablet (20 mg total) by mouth at bedtime.  90 tablet  3  . timolol (BETIMOL) 0.5 % ophthalmic solution Place 1 drop into both eyes 2 (two) times daily.      Marland Kitchen tobramycin (TOBREX) 0.3 % ophthalmic ointment Place 1 application into the left eye as needed.       No facility-administered encounter medications on file as of 08/23/2013.    No Known Allergies   Current Medications, Allergies, Past Medical History, Past Surgical History, Family History, and Social History were reviewed in [employer] record.    Review of Systems       See HPI - all other systems neg except as noted...  The patient complains of dyspnea on exertion.  The patient denies anorexia, fever, weight loss, weight gain, vision loss, decreased hearing, hoarseness, chest pain, syncope, peripheral edema, prolonged cough, headaches, hemoptysis, abdominal pain, melena, hematochezia, severe indigestion/heartburn, hematuria, incontinence, muscle weakness, suspicious skin lesions, transient blindness, difficulty walking, depression, unusual weight change, abnormal bleeding, enlarged lymph nodes, and angioedema.     Objective:   Physical Exam     WD, Thin,  79 y/o WF in NAD... she is chr ill appearing...  GENERAL:  Alert & oriented; pleasant & cooperative... HEENT:  [place]/AT, EACs-clear, TMs-wnl, Cochlear implant, NOSE-clear, THROAT-clear & wnl. NECK:  Supple w/ fairROM; no  JVD; normal carotid impulses w/o bruits; no thyromegaly or nodules palpated; no lymphadenopathy. CHEST:  s/p left mastectomy, clear lungs w/o wheezing, rales, or rhonchi... HEART:  Regular Rhythm;  g 1/6 SEM without rubs or gallops... ABDOMEN:  Soft & nontender; normal bowel sounds; no organomegaly or masses detected. EXT: without deformities, mod arthritic changes; no varicose veins/ +venous insuffic/ no edema.  NEURO:  CN's intact;  no asymmetry;  no focal neuro deficits... DERM:  No lesions noted; no rash etc...  RADIOLOGY DATA:  Reviewed in the EPIC EMR & discussed w/ the patient> see above...  LABORATORY DATA:  Reviewed in the EPIC EMR & discussed w/ the patient> see above...   Assessment & Plan:    Hx recurrent bronchitis/ no RLL nodule on CT>  CXR 8/12 suggested poss RLL nodule, but CT confirmed it was just the ant portion of right 6th rib (costochondral junction); no other lesions identified on CT...  Breathing well, no recurrent bronchitic episode, etc...  HBP>  Controlled on meds, continue same...  CEREBROVASC Dis/ TIA>  As noted she is currently taking ASA 325mg /d & has not had any cerebral ischemic symptoms.  CHOL>  On Simva20 + diet & reminded to take med regularly...  HYPOTHYROID>  Stable on Synthroid 68mcg/d...  GI>  Gastitis, Hx NissenFundoplication, Divertics, IBS, Hems>  3/12 GI eval DrDBrodie reviewed- BRB in stool likely from [facility] & rx by GI...  Renal Insuffic>  Stable renal insuffic w/ Creat= 1.6-1.7 rangeprev; she knows to avoid NSAIDs etc...  Breast Cancer>  Followed by Lenis Noon on Armidex & stable...  DJD>  She uses OTC anti-inflamm as needed...  DYSTHYMIA>  Followed by DrPPittman on Luvox & Nortriptyline...   Patient's Medications  New  Prescriptions   No medications on file  Previous Medications   ACETAMINOPHEN (TYLENOL) 650 MG CR TABLET    Take 650 mg by mouth every 8 (eight) hours as needed.     ANASTROZOLE (ARIMIDEX) 1 MG TABLET    Take 1 tablet (1 mg total) by mouth daily.   ASPIRIN 325 MG TABLET    Take 325 mg by mouth daily.     ATENOLOL-CHLORTHALIDONE (TENORETIC) 50-25 MG PER TABLET    Take 1 tablet by mouth daily.   CETIRIZINE (ZYRTEC) 10 MG TABLET    Take 10 mg by mouth daily.     FAMOTIDINE (PEPCID) 20 MG TABLET    Take 1 tablet (20 mg total) by mouth 2 (two) times daily as needed.   FESOTERODINE (TOVIAZ) 4 MG TB24    Take 4 mg by mouth daily.   FISH OIL-OMEGA-3 FATTY ACIDS 1000 MG CAPSULE    Take 2 g by mouth daily.     FLUOCINONIDE CREAM (LIDEX) 0.05 %    Apply topically 2 (two) times daily.   FLUTICASONE (FLONASE) 50 MCG/ACT NASAL SPRAY    Place 2 sprays into the nose daily.   FLUVOXAMINE (LUVOX) 100 MG TABLET    Take as directed by Dr. Raquel James   LATANOPROST (XALATAN) 0.005 % OPHTHALMIC SOLUTION    Place 1 drop into both eyes at bedtime.     LEVOTHYROXINE (SYNTHROID, LEVOTHROID) 50 MCG TABLET    Take 1 tablet (50 mcg total) by mouth daily.   LISINOPRIL (PRINIVIL,ZESTRIL) 20 MG TABLET    Take 1 tablet (20 mg total) by mouth daily.   MIRABEGRON ER (MYRBETRIQ) 25 MG TB24 TABLET    Take 25 mg by mouth daily.   MULTIPLE VITAMINS-MINERALS (WOMENS MULTIVITAMIN PLUS PO)  Take 1 tablet by mouth daily.     NON FORMULARY    Post Surgical Bras   NON FORMULARY    Non-Silicone Breast Prosthesis   NON FORMULARY    Silicone Breast Prosthesis   NORTRIPTYLINE (PAMELOR) 10 MG CAPSULE    Take 10 mg by mouth at bedtime.     POTASSIUM CHLORIDE SA (K-DUR,KLOR-CON) 20 MEQ TABLET    Take 1 tablet (20 mEq total) by mouth daily.   SIMVASTATIN (ZOCOR) 20 MG TABLET    Take 1 tablet (20 mg total) by mouth at bedtime.   TIMOLOL (BETIMOL) 0.5 % OPHTHALMIC SOLUTION    Place 1 drop into both eyes 2 (two) times daily.   TOBRAMYCIN (TOBREX)  0.3 % OPHTHALMIC OINTMENT    Place 1 application into the left eye as needed.  Modified Medications   No medications on file  Discontinued Medications   No medications on file

## 2013-08-23 NOTE — Patient Instructions (Addendum)
Today we updated your med list in our EPIC system...    Continue your current medications the same...  Today we gave you the 2014 FLU vaccine...  Call for any questions...  Let's plan a follow up visit in 6mo, sooner if needed for problems...   

## 2013-11-10 ENCOUNTER — Telehealth: Payer: Self-pay | Admitting: Pulmonary Disease

## 2013-11-10 NOTE — Telephone Encounter (Signed)
Recd records from [facility] Forwarding 9pgs to Dr.Nadel

## 2013-11-24 ENCOUNTER — Telehealth: Payer: Self-pay | Admitting: Pulmonary Disease

## 2013-11-24 MED ORDER — AMOXICILLIN-POT CLAVULANATE 875-125 MG PO TABS: 1.0000 | ORAL_TABLET | Freq: Two times a day (BID) | ORAL | Status: DC

## 2013-11-24 NOTE — Telephone Encounter (Signed)
Per SN---  augmentin 875 mg  #14  1 po bid Align once daily mucinex otc 600mg   2 po bid Increase fluids Delsym 2 tsp bid Tylenol prn.   Called and spoke with pt and she is aware of SN recs.  Nothing further is needed.

## 2013-11-24 NOTE — Telephone Encounter (Signed)
Called and spoke with pt and she is c/o  Cough that she is unable to get any of the congestion up.  Nasal congestion and drainage that is yellow.   This has been going on for 1 week.  She has been using the delsym without any relief.  Pt is requesting recs from SN.  Please advise. Thanks  No Known Allergies    Current Outpatient Prescriptions on File Prior to Visit  Medication Sig Dispense Refill  . acetaminophen (TYLENOL) 650 MG CR tablet Take 650 mg by mouth every 8 (eight) hours as needed.        Marland Kitchen anastrozole (ARIMIDEX) 1 MG tablet Take 1 tablet (1 mg total) by mouth daily.  90 tablet  3  . aspirin 325 MG tablet Take 325 mg by mouth daily.        Marland Kitchen atenolol-chlorthalidone (TENORETIC) 50-25 MG per tablet Take 1 tablet by mouth daily.  90 tablet  3  . cetirizine (ZYRTEC) 10 MG tablet Take 10 mg by mouth daily.        . famotidine (PEPCID) 20 MG tablet Take 1 tablet (20 mg total) by mouth 2 (two) times daily as needed.  180 tablet  3  . fesoterodine (TOVIAZ) 4 MG TB24 Take 4 mg by mouth daily.      . fish oil-omega-3 fatty acids 1000 MG capsule Take 2 g by mouth daily.        . fluocinonide cream (LIDEX) 0.05 % Apply topically 2 (two) times daily.  90 g  3  . fluticasone (FLONASE) 50 MCG/ACT nasal spray Place 2 sprays into the nose daily.  48 g  3  . fluvoxaMINE (LUVOX) 100 MG tablet Take as directed by Dr. Abner Greenspan      . latanoprost (XALATAN) 0.005 % ophthalmic solution Place 1 drop into both eyes at bedtime.        Marland Kitchen levothyroxine (SYNTHROID, LEVOTHROID) 50 MCG tablet Take 1 tablet (50 mcg total) by mouth daily.  90 tablet  3  . lisinopril (PRINIVIL,ZESTRIL) 20 MG tablet Take 1 tablet (20 mg total) by mouth daily.  90 tablet  3  . mirabegron ER (MYRBETRIQ) 25 MG TB24 tablet Take 25 mg by mouth daily.      . Multiple Vitamins-Minerals (WOMENS MULTIVITAMIN PLUS PO) Take 1 tablet by mouth daily.        . NON FORMULARY Post Surgical Bras      . NON FORMULARY Non-Silicone Breast Prosthesis       . NON FORMULARY Silicone Breast Prosthesis      . nortriptyline (PAMELOR) 10 MG capsule Take 10 mg by mouth at bedtime.        . potassium chloride SA (K-DUR,KLOR-CON) 20 MEQ tablet Take 1 tablet (20 mEq total) by mouth daily.  90 tablet  3  . simvastatin (ZOCOR) 20 MG tablet Take 1 tablet (20 mg total) by mouth at bedtime.  90 tablet  3  . timolol (BETIMOL) 0.5 % ophthalmic solution Place 1 drop into both eyes 2 (two) times daily.      Marland Kitchen tobramycin (TOBREX) 0.3 % ophthalmic ointment Place 1 application into the left eye as needed.       No current facility-administered medications on file prior to visit.

## 2013-11-29 ENCOUNTER — Telehealth: Payer: Self-pay | Admitting: Pulmonary Disease

## 2013-11-29 MED ORDER — LISINOPRIL 20 MG PO TABS: 20.0000 mg | ORAL_TABLET | Freq: Every day | ORAL | Status: DC

## 2013-11-29 MED ORDER — ATENOLOL-CHLORTHALIDONE 50-25 MG PO TABS: 1.0000 | ORAL_TABLET | Freq: Every day | ORAL | Status: DC

## 2013-11-29 MED ORDER — FESOTERODINE FUMARATE ER 4 MG PO TB24: 4.0000 mg | ORAL_TABLET | Freq: Every day | ORAL | Status: DC

## 2013-11-29 MED ORDER — NORTRIPTYLINE HCL 10 MG PO CAPS: 10.0000 mg | ORAL_CAPSULE | Freq: Every day | ORAL | Status: DC

## 2013-11-29 MED ORDER — LEVOTHYROXINE SODIUM 50 MCG PO TABS: 50.0000 ug | ORAL_TABLET | Freq: Every day | ORAL | Status: DC

## 2013-11-29 MED ORDER — ANASTROZOLE 1 MG PO TABS: 1.0000 mg | ORAL_TABLET | Freq: Every day | ORAL | Status: DC

## 2013-11-29 MED ORDER — MIRABEGRON ER 25 MG PO TB24: 25.0000 mg | ORAL_TABLET | Freq: Every day | ORAL | Status: DC

## 2013-11-29 MED ORDER — FAMOTIDINE 20 MG PO TABS: 20.0000 mg | ORAL_TABLET | Freq: Two times a day (BID) | ORAL | Status: DC | PRN

## 2013-11-29 MED ORDER — POTASSIUM CHLORIDE CRYS ER 20 MEQ PO TBCR: 20.0000 meq | EXTENDED_RELEASE_TABLET | Freq: Every day | ORAL | Status: DC

## 2013-11-29 MED ORDER — CETIRIZINE HCL 10 MG PO TABS: 10.0000 mg | ORAL_TABLET | Freq: Every day | ORAL | Status: AC

## 2013-11-29 MED ORDER — SIMVASTATIN 20 MG PO TABS: 20.0000 mg | ORAL_TABLET | Freq: Every day | ORAL | Status: DC

## 2013-11-29 NOTE — Telephone Encounter (Signed)
Spoke with pt. She reports she now is going to use right source.  Confirmed medications needed to be sent. Nothing further needed

## 2013-11-30 ENCOUNTER — Telehealth: Payer: Self-pay | Admitting: Pulmonary Disease

## 2013-11-30 DIAGNOSIS — H409 Unspecified glaucoma: Secondary | ICD-10-CM

## 2013-11-30 NOTE — Telephone Encounter (Signed)
Called and spoke with pt and she stated that she changed her insurance this year and she stated that now  [name] is telling her that if she goes to any other doctor other than SN she will need to have SN send a referral to that doctor.  These are the following doctors that she see's:  Retina specialist--Dr. Sherlynn Stalls  Urology   Dr. Matilde Sprang Psych  Dr. Casimiro Needle Eye doctor  Dr. Lillie Columbia has an appt on 12-11-2013 but she is unsure of who she will see.    SN please advise. Thanks

## 2013-12-06 NOTE — Telephone Encounter (Signed)
Called spoke with patient who stated that she has just seen her [facility] Dr Katy Apo @ [facility].  Her patient instructions from that office state that she will be expected to pay for the visit out of pocket unless our office can place a referral for [facility] to file her insurance.  Advised pt we will be happy to assist her with this.  However regarding her previous conversation with Marliss Czar, Marliss Czar stated that we will be unable to place referrals for all of those physicians at this one time.  Instead, pt is to have each office call us as she sees them for a referral.  Pt okay with this and verbalized her understanding.  Nothing further needed at this time; will sign off.  Referral placed to [facility] per pt's request. Nothing further needed at this time; will sign off.

## 2013-12-06 NOTE — Telephone Encounter (Signed)
Pt is asking to speak w/ Leigh regarding the same.  Pt can be reached at [phone].  Satira Anis

## 2013-12-11 ENCOUNTER — Ambulatory Visit (HOSPITAL_BASED_OUTPATIENT_CLINIC_OR_DEPARTMENT_OTHER): Payer: Medicare HMO | Admitting: Hematology and Oncology

## 2013-12-11 ENCOUNTER — Telehealth: Payer: Self-pay | Admitting: *Deleted

## 2013-12-11 ENCOUNTER — Telehealth: Payer: Self-pay | Admitting: Hematology and Oncology

## 2013-12-11 ENCOUNTER — Other Ambulatory Visit (HOSPITAL_BASED_OUTPATIENT_CLINIC_OR_DEPARTMENT_OTHER): Payer: Medicare HMO

## 2013-12-11 ENCOUNTER — Encounter: Payer: Self-pay | Admitting: Hematology and Oncology

## 2013-12-11 VITALS — BP 144/59 | HR 91 | Temp 98.2°F | Resp 17

## 2013-12-11 DIAGNOSIS — N189 Chronic kidney disease, unspecified: Secondary | ICD-10-CM

## 2013-12-11 DIAGNOSIS — D649 Anemia, unspecified: Secondary | ICD-10-CM

## 2013-12-11 DIAGNOSIS — C50919 Malignant neoplasm of unspecified site of unspecified female breast: Secondary | ICD-10-CM

## 2013-12-11 LAB — CBC WITH DIFFERENTIAL/PLATELET
BASO%: 0.7 % (ref 0.0–2.0)
BASOS ABS: 0 10*3/uL (ref 0.0–0.1)
EOS%: 2.8 % (ref 0.0–7.0)
Eosinophils Absolute: 0.1 10*3/uL (ref 0.0–0.5)
HCT: 33.7 % — ABNORMAL LOW (ref 34.8–46.6)
HEMOGLOBIN: 11.3 g/dL — AB (ref 11.6–15.9)
LYMPH%: 40.6 % (ref 14.0–49.7)
MCH: 33.2 pg (ref 25.1–34.0)
MCHC: 33.6 g/dL (ref 31.5–36.0)
MCV: 98.9 fL (ref 79.5–101.0)
MONO#: 0.7 10*3/uL (ref 0.1–0.9)
MONO%: 18.9 % — AB (ref 0.0–14.0)
NEUT%: 37 % — ABNORMAL LOW (ref 38.4–76.8)
NEUTROS ABS: 1.5 10*3/uL (ref 1.5–6.5)
Platelets: 243 10*3/uL (ref 145–400)
RBC: 3.41 10*6/uL — ABNORMAL LOW (ref 3.70–5.45)
RDW: 14.2 % (ref 11.2–14.5)
WBC: 3.9 10*3/uL (ref 3.9–10.3)
lymph#: 1.6 10*3/uL (ref 0.9–3.3)

## 2013-12-11 NOTE — Progress Notes (Signed)
[facility] OFFICE PROGRESS NOTE  Patient Care Team: Alison Space, MD as PCP - General (Pulmonary Disease) Alison Dragon, MD (Gastroenterology) Alison Putnam, MD (Hematology and Oncology)  DIAGNOSIS: Left breast cancer T2, N1, M0, ongoing adjuvant endocrine therapy  SUMMARY OF ONCOLOGIC HISTORY: This is a pleasant 80 year old lady with background history of breast cancer. According to the patient, it was discovered from screening mammogram. She underwent mastectomy in November of 2010 with sentinel lymph node biopsy. She's been placed on Arimidex since November of 2010  INTERVAL HISTORY: Alison Cordova 79 y.o. female returns for further followup. She tolerated treatment well without major side effects such as hot flashes, mood swings, myalgias or arthralgias She denies any recent abnormal breast examination, palpable mass, abnormal breast appearance or nipple changes  I have reviewed the past medical history, past surgical history, social history and family history with the patient and they are unchanged from previous note.  ALLERGIES:  has No Known Allergies.  MEDICATIONS:  Current Outpatient Prescriptions  Medication Sig Dispense Refill  . acetaminophen (TYLENOL) 650 MG CR tablet Take 650 mg by mouth every 8 (eight) hours as needed.        Marland Kitchen amoxicillin-clavulanate (AUGMENTIN) 875-125 MG per tablet Take 1 tablet by mouth 2 (two) times daily.  14 tablet  0  . anastrozole (ARIMIDEX) 1 MG tablet Take 1 tablet (1 mg total) by mouth daily.  90 tablet  3  . aspirin 325 MG tablet Take 325 mg by mouth daily.        Marland Kitchen atenolol-chlorthalidone (TENORETIC) 50-25 MG per tablet Take 1 tablet by mouth daily.  90 tablet  3  . cetirizine (ZYRTEC) 10 MG tablet Take 1 tablet (10 mg total) by mouth daily.  90 tablet  3  . famotidine (PEPCID) 20 MG tablet Take 1 tablet (20 mg total) by mouth 2 (two) times daily as needed.  180 tablet  3  . fesoterodine (TOVIAZ) 4 MG TB24 tablet Take 1 tablet  (4 mg total) by mouth daily.  90 tablet  3  . fish oil-omega-3 fatty acids 1000 MG capsule Take 2 g by mouth daily.        . fluocinonide cream (LIDEX) 0.05 % Apply topically 2 (two) times daily.  90 g  3  . fluticasone (FLONASE) 50 MCG/ACT nasal spray Place 2 sprays into the nose daily.  48 g  3  . fluvoxaMINE (LUVOX) 100 MG tablet Take as directed by Dr. Abner Greenspan      . latanoprost (XALATAN) 0.005 % ophthalmic solution Place 1 drop into both eyes at bedtime.        Marland Kitchen levothyroxine (SYNTHROID, LEVOTHROID) 50 MCG tablet Take 1 tablet (50 mcg total) by mouth daily.  90 tablet  3  . lisinopril (PRINIVIL,ZESTRIL) 20 MG tablet Take 1 tablet (20 mg total) by mouth daily.  90 tablet  3  . mirabegron ER (MYRBETRIQ) 25 MG TB24 tablet Take 1 tablet (25 mg total) by mouth daily.  90 tablet  3  . Multiple Vitamins-Minerals (WOMENS MULTIVITAMIN PLUS PO) Take 1 tablet by mouth daily.        . NON FORMULARY Post Surgical Bras      . NON FORMULARY Non-Silicone Breast Prosthesis      . NON FORMULARY Silicone Breast Prosthesis      . nortriptyline (PAMELOR) 10 MG capsule Take 1 capsule (10 mg total) by mouth at bedtime.  90 capsule  3  . potassium chloride  SA (K-DUR,KLOR-CON) 20 MEQ tablet Take 1 tablet (20 mEq total) by mouth daily.  90 tablet  3  . simvastatin (ZOCOR) 20 MG tablet Take 1 tablet (20 mg total) by mouth at bedtime.  90 tablet  3  . timolol (BETIMOL) 0.5 % ophthalmic solution Place 1 drop into both eyes 2 (two) times daily.      Marland Kitchen tobramycin (TOBREX) 0.3 % ophthalmic ointment Place 1 application into the left eye as needed.      . [DISCONTINUED] fesoterodine (TOVIAZ) 4 MG TB24 Take 4 mg by mouth daily.       No current facility-administered medications for this visit.    REVIEW OF SYSTEMS:   Constitutional: Denies fevers, chills or abnormal weight loss Eyes: Denies blurriness of vision Ears, nose, mouth, throat, and face: Denies mucositis or sore throat Respiratory: Denies cough, dyspnea or  wheezes Cardiovascular: Denies palpitation, chest discomfort or lower extremity swelling Gastrointestinal:  Denies nausea, heartburn or change in bowel habits Skin: Denies abnormal skin rashes Lymphatics: Denies new lymphadenopathy or easy bruising Neurological:Denies numbness, tingling or new weaknesses Behavioral/Psych: Mood is stable, no new changes  All other systems were reviewed with the patient and are negative.  PHYSICAL EXAMINATION: ECOG PERFORMANCE STATUS: 0 - Asymptomatic  Filed Vitals:   12/11/13 0838  BP: 144/59  Pulse: 91  Temp: 98.2 F (36.8 C)  Resp: 17   There were no vitals filed for this visit.  GENERAL:alert, no distress and comfortable SKIN: skin color, texture, turgor are normal, no rashes or significant lesions EYES: normal, Conjunctiva are pink and non-injected, sclera clear OROPHARYNX:no exudate, no erythema and lips, buccal mucosa, and tongue normal  NECK: supple, thyroid normal size, non-tender, without nodularity LYMPH:  no palpable lymphadenopathy in the cervical, axillary or inguinal LUNGS: clear to auscultation and percussion with normal breathing effort HEART: regular rate & rhythm and no murmurs and no lower extremity edema ABDOMEN:abdomen soft, non-tender and normal bowel sounds Musculoskeletal:no cyanosis of digits and no clubbing  NEURO: alert & oriented x 3 with fluent speech, no focal motor/sensory deficits Breast examination on the right is normal. On the left, well-healed mastectomy scar with no abnormalities LABORATORY DATA:  I have reviewed the data as listed    Component Value Date/Time   NA 141 02/21/2013 0949   NA 140 12/19/2012 0817   K 4.3 02/21/2013 0949   K 3.9 12/19/2012 0817   CL 102 02/21/2013 0949   CL 102 12/19/2012 0817   CO2 27 02/21/2013 0949   CO2 27 12/19/2012 0817   GLUCOSE 96 02/21/2013 0949   GLUCOSE 104* 12/19/2012 0817   BUN 24* 02/21/2013 0949   BUN 25.3 12/19/2012 0817   CREATININE 1.4* 02/21/2013 0949   CREATININE 1.7*  12/19/2012 0817   CALCIUM 10.3 02/21/2013 0949   CALCIUM 10.4 12/19/2012 0817   PROT 8.3 02/21/2013 0949   PROT 7.8 12/19/2012 0817   ALBUMIN 4.7 02/21/2013 0949   ALBUMIN 4.2 12/19/2012 0817   AST 20 02/21/2013 0949   AST 47* 12/19/2012 0817   ALT 14 02/21/2013 0949   ALT 39 12/19/2012 0817   ALKPHOS 43 02/21/2013 0949   ALKPHOS 51 12/19/2012 0817   BILITOT 0.7 02/21/2013 0949   BILITOT 0.49 12/19/2012 0817   GFRNONAA 42* 06/09/2010 0900   GFRAA  Value: 51        The eGFR has been calculated using the MDRD equation. This calculation has not been validated in all clinical situations. eGFR's persistently <60 mL/min signify  possible Chronic Kidney Disease.* 06/09/2010 0900    No results found for this basename: SPEP, UPEP,  kappa and lambda light chains    Lab Results  Component Value Date   WBC 3.9 12/11/2013   NEUTROABS 1.5 12/11/2013   HGB 11.3* 12/11/2013   HCT 33.7* 12/11/2013   MCV 98.9 12/11/2013   PLT 243 12/11/2013      Chemistry      Component Value Date/Time   NA 141 02/21/2013 0949   NA 140 12/19/2012 0817   K 4.3 02/21/2013 0949   K 3.9 12/19/2012 0817   CL 102 02/21/2013 0949   CL 102 12/19/2012 0817   CO2 27 02/21/2013 0949   CO2 27 12/19/2012 0817   BUN 24* 02/21/2013 0949   BUN 25.3 12/19/2012 0817   CREATININE 1.4* 02/21/2013 0949   CREATININE 1.7* 12/19/2012 0817      Component Value Date/Time   CALCIUM 10.3 02/21/2013 0949   CALCIUM 10.4 12/19/2012 0817   ALKPHOS 43 02/21/2013 0949   ALKPHOS 51 12/19/2012 0817   AST 20 02/21/2013 0949   AST 47* 12/19/2012 0817   ALT 14 02/21/2013 0949   ALT 39 12/19/2012 0817   BILITOT 0.7 02/21/2013 0949   BILITOT 0.49 12/19/2012 0817     ASSESSMENT & PLAN:  #1 T2, N1, M0 left breast cancer Clinically she has no evidence of disease recurrence. I will see her back in November of this year. If the patient have no evidence of recurrence, we will discontinue treatment after that #2 chronic kidney disease She will continue blood pressure management by primary care  provider #3 anemia This is likely anemia of chronic disease. The patient denies recent history of bleeding such as epistaxis, hematuria or hematochezia. She is asymptomatic from the anemia. We will observe for now.  She does not require transfusion now.  #4 preventive care She is up-to-date with influenza vaccination. I recommend vitamin D supplements. All questions were answered. The patient knows to call the clinic with any problems, questions or concerns. No barriers to learning was detected. I spent 15 minutes counseling the patient face to face. The total time spent in the appointment was 20 minutes and more than 50% was on counseling and review of test results     [facility], Alicia, MD 12/11/2013 12:43 PM

## 2013-12-11 NOTE — Telephone Encounter (Signed)
Pt requested RN call her PCP,  Dr. Lenna Gilford, to notify their office that she was seen by Dr. Alvy Bimler today so they can do the referral for her insurance.   Called Dr. Jeannine Kitten office and left message w/ [place] informing of pt seen by Dr. Alvy Bimler today and request they make referral for pt's insurance.

## 2013-12-11 NOTE — Telephone Encounter (Signed)
Gave pt appt for md on November 2015

## 2013-12-20 ENCOUNTER — Telehealth: Payer: Self-pay | Admitting: Pulmonary Disease

## 2013-12-20 MED ORDER — POTASSIUM CHLORIDE CRYS ER 20 MEQ PO TBCR: 20.0000 meq | EXTENDED_RELEASE_TABLET | Freq: Every day | ORAL | Status: DC

## 2013-12-20 NOTE — Telephone Encounter (Signed)
Rx has been sent in. Pt is aware. 

## 2014-01-25 ENCOUNTER — Other Ambulatory Visit: Payer: Self-pay | Admitting: Pulmonary Disease

## 2014-01-25 DIAGNOSIS — J309 Allergic rhinitis, unspecified: Secondary | ICD-10-CM

## 2014-02-09 ENCOUNTER — Telehealth: Payer: Self-pay | Admitting: Pulmonary Disease

## 2014-02-09 DIAGNOSIS — C50919 Malignant neoplasm of unspecified site of unspecified female breast: Secondary | ICD-10-CM

## 2014-02-09 NOTE — Telephone Encounter (Signed)
Order  Has been placed and we will get the pt set up with this referral.  i have called and lmom to make the pt aware.

## 2014-02-13 ENCOUNTER — Telehealth: Payer: Self-pay | Admitting: Pulmonary Disease

## 2014-02-13 MED ORDER — FLUTICASONE PROPIONATE 50 MCG/ACT NA SUSP: 2.0000 | Freq: Every day | NASAL | Status: DC

## 2014-02-13 NOTE — Telephone Encounter (Signed)
Refills of the fluticasone has been sent to the pharmacy and pt is aware.  Nothing further is needed.

## 2014-02-21 ENCOUNTER — Ambulatory Visit (INDEPENDENT_AMBULATORY_CARE_PROVIDER_SITE_OTHER)
Admission: RE | Admit: 2014-02-21 | Discharge: 2014-02-21 | Disposition: A | Discharge status: Home / Self Care | Payer: Commercial Managed Care - HMO | Source: Ambulatory Visit | Attending: Pulmonary Disease | Admitting: Pulmonary Disease

## 2014-02-21 ENCOUNTER — Ambulatory Visit (INDEPENDENT_AMBULATORY_CARE_PROVIDER_SITE_OTHER): Payer: Medicare HMO | Admitting: Pulmonary Disease

## 2014-02-21 ENCOUNTER — Other Ambulatory Visit (INDEPENDENT_AMBULATORY_CARE_PROVIDER_SITE_OTHER): Payer: Medicare HMO

## 2014-02-21 ENCOUNTER — Encounter: Payer: Self-pay | Admitting: Pulmonary Disease

## 2014-02-21 ENCOUNTER — Ambulatory Visit (INDEPENDENT_AMBULATORY_CARE_PROVIDER_SITE_OTHER)
Admission: RE | Admit: 2014-02-21 | Discharge: 2014-02-21 | Disposition: A | Discharge status: Home / Self Care | Payer: Medicare HMO | Source: Ambulatory Visit | Attending: Pulmonary Disease | Admitting: Pulmonary Disease

## 2014-02-21 VITALS — BP 120/70 | HR 64 | Temp 97.7°F | Ht <= 58 in | Wt 118.4 lb

## 2014-02-21 DIAGNOSIS — E7800 Pure hypercholesterolemia, unspecified: Secondary | ICD-10-CM

## 2014-02-21 DIAGNOSIS — M8580 Other specified disorders of bone density and structure, unspecified site: Secondary | ICD-10-CM

## 2014-02-21 DIAGNOSIS — K5730 Diverticulosis of large intestine without perforation or abscess without bleeding: Secondary | ICD-10-CM

## 2014-02-21 DIAGNOSIS — N182 Chronic kidney disease, stage 2 (mild): Secondary | ICD-10-CM

## 2014-02-21 DIAGNOSIS — C50919 Malignant neoplasm of unspecified site of unspecified female breast: Secondary | ICD-10-CM

## 2014-02-21 DIAGNOSIS — E039 Hypothyroidism, unspecified: Secondary | ICD-10-CM

## 2014-02-21 DIAGNOSIS — D649 Anemia, unspecified: Secondary | ICD-10-CM

## 2014-02-21 DIAGNOSIS — R32 Unspecified urinary incontinence: Secondary | ICD-10-CM

## 2014-02-21 DIAGNOSIS — F341 Dysthymic disorder: Secondary | ICD-10-CM

## 2014-02-21 DIAGNOSIS — M949 Disorder of cartilage, unspecified: Secondary | ICD-10-CM

## 2014-02-21 DIAGNOSIS — I10 Essential (primary) hypertension: Secondary | ICD-10-CM

## 2014-02-21 DIAGNOSIS — K219 Gastro-esophageal reflux disease without esophagitis: Secondary | ICD-10-CM

## 2014-02-21 DIAGNOSIS — M899 Disorder of bone, unspecified: Secondary | ICD-10-CM

## 2014-02-21 DIAGNOSIS — K5900 Constipation, unspecified: Secondary | ICD-10-CM

## 2014-02-21 DIAGNOSIS — G609 Hereditary and idiopathic neuropathy, unspecified: Secondary | ICD-10-CM

## 2014-02-21 DIAGNOSIS — I6350 Cerebral infarction due to unspecified occlusion or stenosis of unspecified cerebral artery: Secondary | ICD-10-CM

## 2014-02-21 DIAGNOSIS — M159 Polyosteoarthritis, unspecified: Secondary | ICD-10-CM

## 2014-02-21 DIAGNOSIS — I679 Cerebrovascular disease, unspecified: Secondary | ICD-10-CM

## 2014-02-21 LAB — HEPATIC FUNCTION PANEL
ALT: 12 U/L (ref 0–35)
AST: 24 U/L (ref 0–37)
Albumin: 4.8 g/dL (ref 3.5–5.2)
Alkaline Phosphatase: 45 U/L (ref 39–117)
BILIRUBIN DIRECT: 0 mg/dL (ref 0.0–0.3)
Total Bilirubin: 0.6 mg/dL (ref 0.3–1.2)
Total Protein: 7.9 g/dL (ref 6.0–8.3)

## 2014-02-21 LAB — CBC WITH DIFFERENTIAL/PLATELET
BASOS ABS: 0 10*3/uL (ref 0.0–0.1)
BASOS PCT: 0.3 % (ref 0.0–3.0)
EOS ABS: 0.1 10*3/uL (ref 0.0–0.7)
Eosinophils Relative: 1.5 % (ref 0.0–5.0)
HCT: 36.1 % (ref 36.0–46.0)
Hemoglobin: 12.3 g/dL (ref 12.0–15.0)
LYMPHS PCT: 31 % (ref 12.0–46.0)
Lymphs Abs: 1.4 10*3/uL (ref 0.7–4.0)
MCHC: 34.1 g/dL (ref 30.0–36.0)
MCV: 95.6 fl (ref 78.0–100.0)
Monocytes Absolute: 0.8 10*3/uL (ref 0.1–1.0)
Monocytes Relative: 17.3 % — ABNORMAL HIGH (ref 3.0–12.0)
NEUTROS PCT: 49.9 % (ref 43.0–77.0)
Neutro Abs: 2.2 10*3/uL (ref 1.4–7.7)
Platelets: 254 10*3/uL (ref 150.0–400.0)
RBC: 3.78 Mil/uL — AB (ref 3.87–5.11)
RDW: 13.3 % (ref 11.5–14.6)
WBC: 4.5 10*3/uL (ref 4.5–10.5)

## 2014-02-21 LAB — TSH: TSH: 2.57 u[IU]/mL (ref 0.35–5.50)

## 2014-02-21 LAB — BASIC METABOLIC PANEL
BUN: 20 mg/dL (ref 6–23)
CHLORIDE: 102 meq/L (ref 96–112)
CO2: 31 mEq/L (ref 19–32)
Calcium: 10.3 mg/dL (ref 8.4–10.5)
Creatinine, Ser: 1.4 mg/dL — ABNORMAL HIGH (ref 0.4–1.2)
GFR: 37.32 mL/min — ABNORMAL LOW (ref >60.00)
GLUCOSE: 100 mg/dL — AB (ref 70–99)
POTASSIUM: 3.9 meq/L (ref 3.5–5.1)
SODIUM: 140 meq/L (ref 135–145)

## 2014-02-21 LAB — LIPID PANEL
Cholesterol: 183 mg/dL (ref 0–200)
HDL: 59.8 mg/dL (ref >39.00)
LDL Cholesterol: 87 mg/dL (ref 0–99)
Total CHOL/HDL Ratio: 3
Triglycerides: 183 mg/dL — ABNORMAL HIGH (ref 0.0–149.0)
VLDL: 36.6 mg/dL (ref 0.0–40.0)

## 2014-02-21 NOTE — Progress Notes (Signed)
Subjective:    Patient ID: Alison Cordova, female    DOB: November 26, 1925, 80 y.o.   MRN: [id number]  HPI 80 y/o WF here for a follow up visit... she has mult med problems as noted below... she is HOH and had a cochlear implant yrs ago... she is 80 y/o and rather frail (lives w/ her daughter & her family)...  ~  January 04, 2012:  59mo ROV & she has mult somatic complaints> tired, back pain, HAs, sl dizzy on occas, wants handicap sticker- ok; offered pain meds etc but she declines "I use Tylenol"    She saw DrHa 1/13 for f/u breast cancer on Arimidex since 11/10> note reviewed: +invasion & 1/3 nodes; neg mammogram 10/12 at [facility]; BMD 9/12 here was neg w/o osteoporosis; no changes made...    She saw DrHIngram 12/12 for surg f/u & he released her to [facility] care, stable, no known recurrence, f/u prn...    We reviewed lab summary & prev Imaging> labs done 1/13 showed Creat 1.7, Hg 11.0;  She had BMD 9/12 w/ TScores +6.4 in Spine and -0.7 in left FemNeck...  ~  August 26, 2012:  71mo ROV & Alison Cordova notes "I'm getting old, slowing down" but generally stable & only c/o constip & we discussed Miralax, Senakot-S, etc...     She had Urology f/u DrMacDiarmid 8/13> urge incont, pt c/o "I'm leaking"; w/u revealed clear urine, bladder scan w/ 50cc residual, neg cysto; placed on Toviaz 4mg /d & pt states improved...    She had Oncology f/u w/ Storm Frisk 7/13> breast cancer (surg 11/10, on Arimidex since then), anemia, renal insuffic> doing well, no evid mets, he does labs every 21mo...    We reviewed prob list, meds, xrays and labs> see below for updates >> OK Flu vaccine today.  LABS 7/13:  Chems- ok w/ BUN=21 Creat=1.46;  CBC- Hg=10.5, Fe=81, SPE/IEP=ok...  LABS 10/13:  Chems- ok w/ Creat=1.6  ~  February 21, 2013:  86mo ROV & Alison Cordova had a great grandson recently born & all 4 generations are living in her home; We reviewed the following medical problems during today's office visit >>     Ophthalmology> hx glaucoma, central retinal  vein occlusion, macular edema & hemorrhage; followed by DrHollander & [facility]...    AR, AB> on Zyrtek, Flonase; she has mid springtime allergy symptoms...    HBP> on Tenoretic50/25, Lisin20, K20; BP= 130/70 & she denies CP, palpit, dizzy, SOB, edema, etc...    Cerebrovasc Dis> on ASA325; she denies any cerebral ischemic symptoms...    CHOL> on Simva20 & FishOil; FLP 4/14 shows TChol 191, TG 139, HDL 63, LDL 100    Hypothy> on Synthroid50; labs 4/14 showed TSH= 1.42    GI- GERD, Divertics, IBS, Hems> on Pepcid20; followed by DrDBrodie & stable, last colon 3/12 was neg x hems...    GU- Urinary Incont> on Toviaz4; followed by Urology & improved; labs 4/14 showed Cr= 1.4    Hx breast cancer> on Arimidex1mg ; followed by Roxan Hockey- seen 1/14 & doing satis on Arimidex since 11/10, no evid recurrence etc...    DJD> on OTC analgesics as needed; no evid of osteoporosis on BMDs from Oncology.Marland Kitchen    Hx Stroke, HAs, Neuropathy> stable on her current therapy...    Anxiety, Depression> on Pamelor10 & Luvox100 per DrPittman, Psychiatry... We reviewed prob list, meds, xrays and labs> see below for updates >>   CXR 4/14 showed borderline heart size, tortuousAo, clear lungs, mod thor spondy, NAD.Marland KitchenMarland Kitchen  LABS 4/14:  FLP- at goals on Simva20;  Chems- ok w/ Cr=1.4;  CBC- ok w/ Hg=12.2;  TSH=1.42;  VitD=59...  ~  August 23, 2013:  77mo ROV & happy 87th birthday today!  Alison Cordova is stable 9 doing reasonably well for 87; she has mult somatic complaints and  A [employer] nurse calls her once per month to check on her; she doesn't get much exercise- "I walk all I can, and use a cane";  We reviewed the following medical problems during today's office visit >>     Ophthalmology> hx glaucoma, central retinal vein occlusion, macular edema & hemorrhage; followed by DrHollander & [facility] on 3 diff eye drops & Lucentis shots...    AR, AB> on Zyrtek, Flonase; she has mid springtime allergy symptoms but does better in the fall; min dry cough, no  sput, no SOB...    HBP> on Tenoretic50/25, Lisin20, K20; BP= 128/80 & she denies CP, palpit, dizzy, SOB, edema, etc...    Cerebrovasc Dis> on ASA325; she denies any cerebral ischemic symptoms...    CHOL> on Simva20 & FishOil; FLP 4/14 showed TChol 191, TG 139, HDL 63, LDL 100    Hypothy> on Synthroid50; labs 4/14 showed TSH= 1.42    GI- GERD, Divertics, IBS, Hems> on Pepcid20; followed by DrDBrodie & stable, last colon 3/12 was neg x hems; notes sl constip, Rx metamucil...    GU- Urinary Incont> on Toviaz4 & Myrbetriq25 per Urology; Labs 4/14 showed Cr= 1.4; voiding symptoms & incont are improved...    Hx breast cancer> on Arimidex1mg ; followed by Roxan Hockey- seen 1/14 & doing satis on Arimidex since 11/10, no evid recurrence etc, due for mammogram & BMD soon...    DJD> on OTC analgesics as needed; no evid of osteoporosis on BMDs from Oncology.Marland Kitchen    Hx Stroke, HAs, Neuropathy> stable on her current therapy...    Anxiety, Depression> on Pamelor10 & Luvox100 per DrPittman/Plovsky, Psychiatry... We reviewed prob list, meds, xrays and labs> see below for updates >> OK FLU vaccine today...  ~  February 21, 2014:  20mo ROV & Alison Cordova is doing reasonably well w/ only a few minor somatic complaints...  We reviewed the following medical problems during today's office visit >>     Ophthalmology> hx glaucoma, central retinal vein occlusion, macular edema & hemorrhage; followed by DrHollander & [facility] on 3 diff eye drops & Lucentis shots...    AR, AB> on Zyrtek, Flonase; she has mid springtime allergy symptoms but does better in the fall; no exac, min dry cough, no sput, no SOB...    HBP> on Tenoretic50/25, Lisin20, K20; BP= 120/70 & she denies CP, palpit, dizzy, SOB, edema, etc...    Cerebrovasc Dis> on ASA325; she denies any cerebral ischemic symptoms...    CHOL> on Simva20 & FishOil; FLP 4/15 showed TChol 183, TG 183, HDL 60, LDL 87... We reviewed low fat diet...    Hypothy> on Synthroid50; labs 4/15 showed TSH=  2.57    GI- GERD, Divertics, IBS, Hems> on Pepcid20Bid; followed by DrDBrodie & stable, last colon 3/12 was neg x hems; notes sl constip, Rx metamucil...    GU- Urinary Incont> on Toviaz4 & off Myrbetriq per Urology; Labs 4/15 showed Cr= 1.4; voiding symptoms & incont are improved...    Hx breast cancer> on Arimidex1mg ; followed by Roxan Hockey- seen 1/14 & doing satis on Arimidex since 11/10, no evid recurrence etc, due for mammogram & BMD soon...    DJD> on OTC analgesics as needed; no evid  of osteoporosis on BMDs from Oncology.Marland Kitchen    Hx Stroke, HAs, Neuropathy> stable on her current therapy...    Anxiety, Depression> on Pamelor10 & Luvox100 per DrPittman/Plovsky, Psychiatry... We reviewed prob list, meds, xrays and labs> see below for updates >>  CXR 4/15 showed norm heart size, clear lungs w/ min scarring right base, chr changes in Tspine, NAD... LABS 4/15:  FLP- chol ok on simva20 but TG=183 & rec better low fat diet;  Chems- wnl;  CBC- ok w/ Hg=12.3;  TSH=2.51 on Levothy50...             Problem List:  GLAUCOMA (ICD-365.9) - followed by DrHollander on gtts... ~  5/10: sent to retina spec & saw DrSanders 5/10 w/ macular edema & hemorrhage- s/p laser Rx x2. ~  subseq started on series of AVASTIN/Lucentis shots in the eyes that seem to be helping. ~  6/14 & 12/14: she had f/u DrSanders> s/p shots for wet macular degen, vision is stable, they continue regular f/u...  ALLERGIC RHINITIS (ICD-477.9) - on OTC Zyretek, & FLONASE...  BRONCHITIS, RECURRENT (ICD-491.9) - she is an ex-smoker... she had hx of LLL pneumonia in 2000... she denies cough, sputum, change in SOB, etc... ~  CXR 3/11 showed RML scarring, otherw clear lungs, DJD sp, left mastectomy... ~  CXR 8/12 showed ?18mm RLL nodule (near an ant rib tip) ==> CTChest 8/12 confirmed no lung nodule present, no adenopathy, calcif Ao arch, left mastectomy. ~  CXR 4/14 showed borderline cardiomeg, tortuous Ao, clear lungs, mod thor spondylosis,  NAD.Marland Kitchen.  ~  CXR 4/15 showed norm heart size, clear lungs w/ min scarring right base, chr changes in Tspine, NAD...  HYPERTENSION (ICD-401.9) - on ATENOLOL/Hct 50-25 daily + K20/d & LISINOPRIL 20mg /d...   ~  8/12: BP=136/64 today and similar on home checks... she denies CP, palpit, change in SOB...  ~  2/13: BP= 114/64 & she remains asymptomatic... ~  10/13:  BP= 144/70 & she denies CP, palpit, ch in SOB, edema... ~  4/14: on Tenoretic50/25, Lisin20, K20; BP= 130/70 & she denies CP, palpit, dizzy, SOB, edema, etc.  ~  4/15: on Tenoretic50/25, Lisin20, K20; BP= 120/70 & she denies CP, palpit, dizzy, SOB, edema, etc...  CEREBROVASCULAR DISEASE (ICD-437.9) - Hosp 7/11 w/ TIA (right facial numbness & tingling which resolved spont)...  ~  MRI 7/11 w/o acute change- atrophy, sm vessel dis, scat lacunes, CSpine dis;  MRA showed mild to mod atherosclerotic changes, 50% left vertebral stenosis, ?19mm aneurysmal dil right paraophthalmic art... followed by DrSethi> initially on study drug, then switched to [place], but she refused due to cost, then changed to ASA 325mg /d.  HYPERCHOLESTEROLEMIA (ICD-272.0) - on SIMVASTATIN 20mg /d + Fish Oil daily... ~  [place] 4/08 on Simva20 showed TChol 178, TG 171, HDL 71, LDL 73 ~  FLP 4/09 showed TChol 222, TG 112, HDL 109, LDL 95... continue same- GREAT HDL!!! ~  FLP 3/10 on Simva20 showed TChol 179, TG 176, HDL 81, LDL 63... continue same. ~  FLP 9/10 on Simva 20 showed TChol 182, TG 174, HDL 77, LDL 70 ~  FLP 3/11 on Simva20 showed TChol 199, TG 139, HDL 88, LDL 84 ~  FLP 7/11 in hosp on Simva20 showed TChol 180, TG 186, HDL 60, LDL 83 ~  FLP 2/12 on Simva20 showed TChol 217, TG 163, HDL 68, LDL 128... reminded about diet + med. ~  [place] 2/13 on Simva20 showed TChol 205, TG 115, HDL 70, LDL 116 ~  FLP  4/14 on simva20 shows TChol 191, TG 139, HDL 63, LDL 100 ~  FLP 4/15 on Simva20 showed TChol 183, TG 183, HDL 60, LDL 87   HYPOTHYROIDISM (ICD-244.9) - on SYNTHROID  16mcg/d... ~  labs 4/08 showed TSH = 0.33 ~  labs 4/09 showed TSH= 1.33 ~  labs 3/10 showed TSH= 1.42 ~  labs 3/11 showed TSH= 1.59 ~  labs 7/11 in hosp showed TSH= 1.63 ~  labs 2/12 on Synthroid50 showed TSH= 1.89 ~  Labs 2/13 on Synthroid50 showed TSH= 1.25 ~  4/14: on Synthroid50; labs 4/14 showed TSH= 1.42  ~  Labs 4/15 on Synthroid50 showed TSH= 2.57  GERD (ICD-530.81) - she has noted some GERD symptoms: rec> Pepcid 20mg  OTC daily but she uses Prn. ~  EGD 3/12 by DrDBrodie showed evid of prev fundoplication, mild gastitis... DIVERTICULOSIS OF COLON (ICD-562.10) -  ~  Colonoscopy 11/06 by DrBrodie was normal, no change from 2000... IRRITABLE BOWEL SYNDROME (ICD-564.1) HEMORRHOIDS >> heme pos stool 3/12 was evaluated by DrDBrodie & determined to be due to Hems... ~  Colonoscopy 3/12 was neg x internal hemorrhoids  Hx of URINARY INCONTINENCE (ICD-788.30) - she saw DrPeterson and had urodynamic studies in 2008... she wears pads. RENAL INSUFFICIENCY >> ~  Labs 4/12 showed BUN=30, Creat=1.53 ~  Labs 8/12 showed BUN=29, Creat-1.66 ~  Labs 1/13 showed BUN=32, Creat=1.71, reminded to stay well hydrated. ~  Labs 10/13 showed BUN=28, Creat=1.6 ~  Labs 4/14 showed BUN=24, Cr=1.4 ~  She had f/u DrMacDiarmid 9/14 & 11/14> freq & urge incont, mild renal insuffic w/ Cr=1.4; added Myrbetriq25Qd to the [id number];   ADENOCARCINOMA, LEFT BREAST (ICD-174.9) - diagnosed w/ left breast adenocarcinoma 11/10 w/ total left mastectomy, sentinel node bx (micro mets in 1/3 nodes), & adjuvant hormonal therapy w/ ANASTROZOLE 1mg /d followed by Storm Frisk since 11/10. ~  She saw [place] 12/12 for surg f/u & he released her to [facility] care, stable, no known recurrence, f/u prn... ~  She saw DrHa 1/13 for f/u breast cancer on Arimidex since 11/10> note reviewed +invasion & 1/3 nodes; neg mammogram 10/12 at [facility]; BMD 9/12 here was neg w/o osteoporosis; no changes made... ~  She saw DrHa 7/13> breast cancer (surg 11/10,  on Arimidex since then), anemia, renal insuffic> doing well, no evid mets, he does labs every 53mo... ~  She saw Oncology 1/14> stable on Arimidex1mg  since 11/10, continue same... ~  7/14: she had Oncology f/u w/ DrHa> she continues in remission; due for mammogram & BMD soon, on Arimidex1mg , Ca + VitD... ~  1/15: she had Oncology f/u DrGorsuch> Hx Breast cancer 2010, s/p surg then Armidex1mg  since 11/10, last mammogram was 10/14- neg; she remains asymptomatic, stable...  DEGENERATIVE JOINT DISEASE, GENERALIZED >> Aware, she declines offeres for pain meds & prefers to use Tylenol prn... ~  BMD here 9/12 showed TScores +6.4 in Spine and -0.7 in left FemNeck;  rec Calcium, MVI, VitD...  CVA (ICD-434.91) - on ASA 325mg /d and then POINT Study drug (Placebo vs Plavix)> then AGGRENOX Bid per neuro but she couldn't afford & switched to ASA 325mg /d. ~  she was seen by Gean Quint in 2001 w/ studies showing bilat sm vessel ischemic strokes... ~  [facility] 7/11 w/ TIA << SEE ABOVE >>  HEADACHE (ICD-784.0) Hx of PERIPHERAL NEUROPATHY (ICD-356.9) - takes NORTRIPTYLINE 10mg - 2tabsQhs (per Psyche)... c/o her feet bothering her at night...   DYSTHYMIA (ICD-300.4) - on LUVOX + NORTRIPTYLINE per Psychiatry, DrPamPittman... incr stress- 56y/o son w/ MI...  ANEMIA (  ICD-285.9) ~  abs 4/09 showed Hg= 13.7 ~  labs 3/11 showed Hg= 12.6 ~  labs 7/11 in hosp showed Hg= 12.6 ~  labs 2/12 showed Hg= 12.6 ~  Labs 1/13 showed Hg= 11.0, MCV= 97, & she was rec to take 1-a-day w/ Fe... ~  Labs 7/13 showed Hg= 10.5 w/ neg anemia w/u by Storm Frisk... ~  Labs 7/14 showed Hg= 11.2 & DrHa feels she has ACD... ~  Labs 4/15 showed Hg= 12.3  Health Maintenance - GYN = prev DrMcPail, but saw new GYN 2011 & told no further evals needed... Mammograms at [facility] (Mammogram 10/10 w/ left breast mass- referred to [facility] & Bx pos for infiltrating ductal cancer)...  BMD here 9/10 showed TScores +5.3 in spine & -0.2 in [facility]... Vit D level 4/09 was  37 & Rx w/ OTC 1000 u daily... she gets yearly Flu shots;  had Pneumovax 49yrs ago;  given TDAP 3/11.   Past Surgical History  Procedure Laterality Date  . Vaginal hysterectomy    . Spine surgery    .  fundiplication    . Eye surgery      TO DESOLVE AN AREA IN HER LEFT EYE?  . Breast surgery      left breast mastectomy     Outpatient Encounter Prescriptions as of 02/21/2014  Medication Sig  . acetaminophen (TYLENOL) 650 MG CR tablet Take 650 mg by mouth every 8 (eight) hours as needed.    Marland Kitchen anastrozole (ARIMIDEX) 1 MG tablet Take 1 tablet (1 mg total) by mouth daily.  Marland Kitchen aspirin 325 MG tablet Take 325 mg by mouth daily.    Marland Kitchen atenolol-chlorthalidone (TENORETIC) 50-25 MG per tablet Take 1 tablet by mouth daily.  . cetirizine (ZYRTEC) 10 MG tablet Take 1 tablet (10 mg total) by mouth daily.  . famotidine (PEPCID) 20 MG tablet Take 1 tablet (20 mg total) by mouth 2 (two) times daily as needed.  . fesoterodine (TOVIAZ) 4 MG TB24 tablet Take 1 tablet (4 mg total) by mouth daily.  . fish oil-omega-3 fatty acids 1000 MG capsule Take 2 g by mouth daily.    . fluocinonide cream (LIDEX) 0.05 % Apply topically 2 (two) times daily.  . fluticasone (FLONASE) 50 MCG/ACT nasal spray Place 2 sprays into both nostrils daily.  . fluvoxaMINE (LUVOX) 100 MG tablet Take as directed by Dr. Abner Greenspan  . latanoprost (XALATAN) 0.005 % ophthalmic solution Place 1 drop into both eyes at bedtime.    Marland Kitchen levothyroxine (SYNTHROID, LEVOTHROID) 50 MCG tablet Take 1 tablet (50 mcg total) by mouth daily.  Marland Kitchen lisinopril (PRINIVIL,ZESTRIL) 20 MG tablet Take 1 tablet (20 mg total) by mouth daily.  . Multiple Vitamins-Minerals (WOMENS MULTIVITAMIN PLUS PO) Take 1 tablet by mouth daily.    . NON FORMULARY Post Surgical Bras  . NON FORMULARY Non-Silicone Breast Prosthesis  . NON FORMULARY Silicone Breast Prosthesis  . nortriptyline (PAMELOR) 10 MG capsule Take 1 capsule (10 mg total) by mouth at bedtime.  . potassium chloride SA  (K-DUR,KLOR-CON) 20 MEQ tablet Take 1 tablet (20 mEq total) by mouth daily.  . simvastatin (ZOCOR) 20 MG tablet Take 1 tablet (20 mg total) by mouth at bedtime.  . timolol (BETIMOL) 0.5 % ophthalmic solution Place 1 drop into both eyes 2 (two) times daily.  Marland Kitchen tobramycin (TOBREX) 0.3 % ophthalmic ointment Place 1 application into the left eye as needed.  . [DISCONTINUED] amoxicillin-clavulanate (AUGMENTIN) 875-125 MG per tablet Take 1 tablet by mouth 2 (two) times  daily.  . [DISCONTINUED] mirabegron ER (MYRBETRIQ) 25 MG TB24 tablet Take 1 tablet (25 mg total) by mouth daily.    No Known Allergies   Current Medications, Allergies, Past Medical History, Past Surgical History, Family History, and Social History were reviewed in [employer] record.    Review of Systems       See HPI - all other systems neg except as noted...  The patient complains of dyspnea on exertion.  The patient denies anorexia, fever, weight loss, weight gain, vision loss, decreased hearing, hoarseness, chest pain, syncope, peripheral edema, prolonged cough, headaches, hemoptysis, abdominal pain, melena, hematochezia, severe indigestion/heartburn, hematuria, incontinence, muscle weakness, suspicious skin lesions, transient blindness, difficulty walking, depression, unusual weight change, abnormal bleeding, enlarged lymph nodes, and angioedema.     Objective:   Physical Exam     WD, Thin, 80 y/o WF in NAD... she is chr ill appearing...  GENERAL:  Alert & oriented; pleasant & cooperative... HEENT:  [place]/AT, EACs-clear, TMs-wnl, Cochlear implant, NOSE-clear, THROAT-clear & wnl. NECK:  Supple w/ fairROM; no JVD; normal carotid impulses w/o bruits; no thyromegaly or nodules palpated; no lymphadenopathy. CHEST:  s/p left mastectomy, clear lungs w/o wheezing, rales, or rhonchi... HEART:  Regular Rhythm;  g 1/6 SEM without rubs or gallops... ABDOMEN:  Soft & nontender; normal bowel sounds; no organomegaly  or masses detected. EXT: without deformities, mod arthritic changes; no varicose veins/ +venous insuffic/ no edema.  NEURO:  CN's intact;  no asymmetry;  no focal neuro deficits... DERM:  No lesions noted; no rash etc...  RADIOLOGY DATA:  Reviewed in the EPIC EMR & discussed w/ the patient> see above...  LABORATORY DATA:  Reviewed in the EPIC EMR & discussed w/ the patient> see above...   Assessment & Plan:    Hx recurrent bronchitis/ no RLL nodule on CT>  CXR 8/12 suggested poss RLL nodule, but CT confirmed it was just the ant portion of right 6th rib (costochondral junction); no other lesions identified on CT...  Breathing well, no recurrent bronchitic episode, etc... CXR 4/15 w/o acute changes...  HBP>  Controlled on meds, continue same...  CEREBROVASC Dis/ TIA>  As noted she is currently taking ASA 325mg /d & has not had any cerebral ischemic symptoms.  CHOL>  On Simva20 + diet & reminded to take med regularly...  HYPOTHYROID>  Stable on Synthroid 29mcg/d...  GI>  Gastitis, Hx NissenFundoplication, Divertics, IBS, Hems>  3/12 GI eval DrDBrodie reviewed- BRB in stool likely from [facility] & rx by GI...  Renal Insuffic>  Stable renal insuffic w/ Creat= 1.6-1.7 rangeprev; she knows to avoid NSAIDs etc...  Breast Cancer>  Followed by DrGorsuch on Armidex & stable...  DJD>  She uses OTC anti-inflamm as needed...  DYSTHYMIA>  Followed by DrPPittman on Luvox & Nortriptyline...   Patient's Medications  New Prescriptions   No medications on file  Previous Medications   ACETAMINOPHEN (TYLENOL) 650 MG CR TABLET    Take 650 mg by mouth every 8 (eight) hours as needed.     ANASTROZOLE (ARIMIDEX) 1 MG TABLET    Take 1 tablet (1 mg total) by mouth daily.   ASPIRIN 325 MG TABLET    Take 325 mg by mouth daily.     ATENOLOL-CHLORTHALIDONE (TENORETIC) 50-25 MG PER TABLET    Take 1 tablet by mouth daily.   CETIRIZINE (ZYRTEC) 10 MG TABLET    Take 1 tablet (10 mg total) by mouth daily.    FAMOTIDINE (PEPCID) 20 MG TABLET  Take 1 tablet (20 mg total) by mouth 2 (two) times daily as needed.   FESOTERODINE (TOVIAZ) 4 MG TB24 TABLET    Take 1 tablet (4 mg total) by mouth daily.   FISH OIL-OMEGA-3 FATTY ACIDS 1000 MG CAPSULE    Take 2 g by mouth daily.     FLUOCINONIDE CREAM (LIDEX) 0.05 %    Apply topically 2 (two) times daily.   FLUTICASONE (FLONASE) 50 MCG/ACT NASAL SPRAY    Place 2 sprays into both nostrils daily.   FLUVOXAMINE (LUVOX) 100 MG TABLET    Take as directed by Dr. Abner Greenspan   LATANOPROST (XALATAN) 0.005 % OPHTHALMIC SOLUTION    Place 1 drop into both eyes at bedtime.     LEVOTHYROXINE (SYNTHROID, LEVOTHROID) 50 MCG TABLET    Take 1 tablet (50 mcg total) by mouth daily.   LISINOPRIL (PRINIVIL,ZESTRIL) 20 MG TABLET    Take 1 tablet (20 mg total) by mouth daily.   MULTIPLE VITAMINS-MINERALS (WOMENS MULTIVITAMIN PLUS PO)    Take 1 tablet by mouth daily.     NON FORMULARY    Post Surgical Bras   NON FORMULARY    Non-Silicone Breast Prosthesis   NON FORMULARY    Silicone Breast Prosthesis   NORTRIPTYLINE (PAMELOR) 10 MG CAPSULE    Take 1 capsule (10 mg total) by mouth at bedtime.   POTASSIUM CHLORIDE SA (K-DUR,KLOR-CON) 20 MEQ TABLET    Take 1 tablet (20 mEq total) by mouth daily.   SIMVASTATIN (ZOCOR) 20 MG TABLET    Take 1 tablet (20 mg total) by mouth at bedtime.   TIMOLOL (BETIMOL) 0.5 % OPHTHALMIC SOLUTION    Place 1 drop into both eyes 2 (two) times daily.   TOBRAMYCIN (TOBREX) 0.3 % OPHTHALMIC OINTMENT    Place 1 application into the left eye as needed.  Modified Medications   No medications on file  Discontinued Medications   AMOXICILLIN-CLAVULANATE (AUGMENTIN) 875-125 MG PER TABLET    Take 1 tablet by mouth 2 (two) times daily.   MIRABEGRON ER (MYRBETRIQ) 25 MG TB24 TABLET    Take 1 tablet (25 mg total) by mouth daily.

## 2014-02-21 NOTE — Patient Instructions (Signed)
Today we updated your med list in our EPIC system...    Continue your current medications the same...  Today we did your follow up CXR & FASTING blood work...    We will contact you w/ the results when available...   We will arrange for a follow up Bone Density test as well...  Call for any questions or if we can be of service in any way.Marland KitchenMarland Kitchen

## 2014-05-31 ENCOUNTER — Other Ambulatory Visit (INDEPENDENT_AMBULATORY_CARE_PROVIDER_SITE_OTHER): Payer: Self-pay

## 2014-05-31 MED ORDER — UNABLE TO FIND: Status: DC

## 2014-06-11 ENCOUNTER — Other Ambulatory Visit (INDEPENDENT_AMBULATORY_CARE_PROVIDER_SITE_OTHER): Payer: Self-pay

## 2014-06-11 MED ORDER — UNABLE TO FIND: Status: DC

## 2014-07-06 ENCOUNTER — Other Ambulatory Visit: Payer: Self-pay | Admitting: Pulmonary Disease

## 2014-08-27 ENCOUNTER — Encounter: Payer: Self-pay | Admitting: Family Medicine

## 2014-08-27 ENCOUNTER — Ambulatory Visit (INDEPENDENT_AMBULATORY_CARE_PROVIDER_SITE_OTHER): Payer: Medicare HMO | Admitting: Family Medicine

## 2014-08-27 VITALS — BP 150/82 | HR 75 | Temp 97.4°F | Ht <= 58 in | Wt 119.0 lb

## 2014-08-27 DIAGNOSIS — N182 Chronic kidney disease, stage 2 (mild): Secondary | ICD-10-CM

## 2014-08-27 DIAGNOSIS — K5909 Other constipation: Secondary | ICD-10-CM

## 2014-08-27 DIAGNOSIS — R32 Unspecified urinary incontinence: Secondary | ICD-10-CM

## 2014-08-27 DIAGNOSIS — C50912 Malignant neoplasm of unspecified site of left female breast: Secondary | ICD-10-CM

## 2014-08-27 DIAGNOSIS — E7800 Pure hypercholesterolemia, unspecified: Secondary | ICD-10-CM

## 2014-08-27 DIAGNOSIS — K219 Gastro-esophageal reflux disease without esophagitis: Secondary | ICD-10-CM

## 2014-08-27 DIAGNOSIS — H409 Unspecified glaucoma: Secondary | ICD-10-CM

## 2014-08-27 DIAGNOSIS — I10 Essential (primary) hypertension: Secondary | ICD-10-CM

## 2014-08-27 DIAGNOSIS — K5900 Constipation, unspecified: Secondary | ICD-10-CM

## 2014-08-27 DIAGNOSIS — F418 Other specified anxiety disorders: Secondary | ICD-10-CM

## 2014-08-27 DIAGNOSIS — F32A Depression, unspecified: Secondary | ICD-10-CM

## 2014-08-27 DIAGNOSIS — J309 Allergic rhinitis, unspecified: Secondary | ICD-10-CM

## 2014-08-27 DIAGNOSIS — I679 Cerebrovascular disease, unspecified: Secondary | ICD-10-CM

## 2014-08-27 DIAGNOSIS — F329 Major depressive disorder, single episode, unspecified: Secondary | ICD-10-CM

## 2014-08-27 DIAGNOSIS — Z23 Encounter for immunization: Secondary | ICD-10-CM

## 2014-08-27 DIAGNOSIS — F419 Anxiety disorder, unspecified: Secondary | ICD-10-CM

## 2014-08-27 NOTE — Progress Notes (Signed)
No chief complaint on file.   HPI:  Alison Cordova is here to establish care. Used to see Dr. Lenna Gilford. Lives with daughter.  Hx of Breast Ca: -managed by Dr. Heath Lark -s/p mastectomy and sentinel node biospy in 2010 -on arimidex - may be stopping this later this year -no recurrence, gets mammogram yearly  Urinary incontinence: -managed by Dr. Arther Dames at [facility] -meds: Marylou Flesher -denies: burning, hematuria  HTN/Hx CVA/HLD: -meds: atenolol-chlorthalidone 50-25mg , lisinopril 20, k - dur 10meq, asa 325 daily, simvastatin 20mg , fish oil -denies: CP, SOB, swelling, palpitations  Glaucoma/Macular Degeneration: -see Dr. Claudean Kinds and [facility] Specialist Dr. Baird Cancer -stable, does not drive  Depression/Insomia: -managed by Dr. Margaret Pyle -meds: luvox and nortiptyline -reports stable -denies worsening mood, no thoughts of self harm  GERD/Constipation: -takes pepcid for this -hx nissen, IBS, blood in stools - eval by GI in 2012 -stable  Allergic rhinitis: -stable -takes claritin for this  CKD: -per notes stable stage II -baseline Cr around 1.7 per notes  Hypothyroidism: -reports stable on same dose of synthroid for a long time -denies: palpitaitons, hot/cold intolerance  Has the following chronic problems and concerns today:  Patient Active Problem List   Diagnosis Date Noted  . Anemia 06/17/2012  . Chronic kidney disease (CKD), stage II (mild) 06/17/2012  . Hemorrhoids 07/03/2011  . Cerebrovascular disease 06/27/2010  . Malignant neoplasm of female breast 02/19/2010  . Glaucoma 09/02/2008  . HYPOTHYROIDISM 03/14/2008  . DYSTHYMIA 03/14/2008  . PERIPHERAL NEUROPATHY 03/14/2008  . ALLERGIC RHINITIS 03/14/2008  . Urinary incontinence 03/14/2008  . HYPERCHOLESTEROLEMIA 09/17/2007  . Essential hypertension 09/17/2007  . GERD 09/17/2007  . DEGENERATIVE JOINT DISEASE, GENERALIZED 09/17/2007  . HEADACHE 09/17/2007    ROS negative for unless  reported above: fevers, unintentional weight loss, hearing or vision loss, chest pain, palpitations, struggling to breath, hemoptysis, melena, hematochezia, hematuria, falls, loc, si, thoughts of self harm  Past Medical History  Diagnosis Date  . Allergy     SEASONAL  . Depression   . Anxiety   . Arthritis   . Cancer     LEFT BREAST CANCER  . Cataract   . Stroke   . Glaucoma   . Hyperlipidemia   . Hypertension   . Thyroid disease   . Anemia 06/17/2012  . Chronic kidney disease (CKD), stage II (mild) 06/17/2012  . Hemorrhoids 07/03/2011  . BRONCHITIS, RECURRENT 03/14/2008    Qualifier: Diagnosis of  By: Lenna Gilford MD, Deborra Medina   . Constipation 08/26/2012  . DIVERTICULOSIS OF COLON 03/14/2008    Qualifier: Diagnosis of  By: Lenna Gilford MD, Deborra Medina   . Irritable bowel syndrome 09/17/2007    Qualifier: Diagnosis of  By: Rosana Hoes CMA, Tammy    . Lung nodule 07/06/2011    No family history on file.  History   Social History  . Marital Status: Widowed    Spouse Name: N/A    Number of Children: N/A  . Years of Education: N/A   Social History Main Topics  . Smoking status: Former Smoker    Types: Cigarettes    Quit date: 11/23/1962  . Smokeless tobacco: Never Used  . Alcohol Use: Yes     Comment: PT STATES DRINKS A BEER OR WINE OCCASSIONALLY, NOT WEEKLY  . Drug Use: No  . Sexual Activity: None   Other Topics Concern  . None   Social History Narrative   Work or School: none      Home Situation: lives with daughter and son  and law      Spiritual Beliefs: [employer]      Lifestyle: no regular exercise; diet is ok             Current outpatient prescriptions:acetaminophen (TYLENOL) 650 MG CR tablet, Take 650 mg by mouth every 8 (eight) hours as needed.  , Disp: , Rfl: ;  anastrozole (ARIMIDEX) 1 MG tablet, TAKE 1 TABLET EVERY DAY, Disp: 90 tablet, Rfl: 0;  aspirin 325 MG tablet, Take 325 mg by mouth daily.  , Disp: , Rfl: ;  atenolol-chlorthalidone (TENORETIC) 50-25 MG per tablet,  TAKE 1 TABLET EVERY DAY, Disp: 90 tablet, Rfl: 0 cetirizine (ZYRTEC) 10 MG tablet, Take 1 tablet (10 mg total) by mouth daily., Disp: 90 tablet, Rfl: 3;  famotidine (PEPCID) 20 MG tablet, TAKE 1 TABLET TWICE DAILY AS NEEDED, Disp: 180 tablet, Rfl: 0;  fesoterodine (TOVIAZ) 4 MG TB24 tablet, Take 1 tablet (4 mg total) by mouth daily., Disp: 90 tablet, Rfl: 3;  fish oil-omega-3 fatty acids 1000 MG capsule, Take 2 g by mouth daily.  , Disp: , Rfl:  fluocinonide cream (LIDEX) 0.05 %, Apply topically 2 (two) times daily., Disp: 90 g, Rfl: 3;  fluticasone (FLONASE) 50 MCG/ACT nasal spray, Place 2 sprays into both nostrils daily., Disp: 48 g, Rfl: 3;  fluvoxaMINE (LUVOX) 100 MG tablet, Take as directed by Dr. Abner Greenspan, Disp: , Rfl: ;  latanoprost (XALATAN) 0.005 % ophthalmic solution, Place 1 drop into both eyes at bedtime.  , Disp: , Rfl:  levothyroxine (SYNTHROID, LEVOTHROID) 50 MCG tablet, TAKE 1 TABLET EVERY DAY, Disp: 90 tablet, Rfl: 0;  lisinopril (PRINIVIL,ZESTRIL) 20 MG tablet, TAKE 1 TABLET EVERY DAY, Disp: 90 tablet, Rfl: 0;  mirabegron ER (MYRBETRIQ) 50 MG TB24 tablet, Take 50 mg by mouth every other day., Disp: , Rfl: ;  Multiple Vitamins-Minerals (WOMENS MULTIVITAMIN PLUS PO), Take 1 tablet by mouth daily.  , Disp: , Rfl: ;  NON FORMULARY, Post Surgical Bras, Disp: , Rfl:  NON FORMULARY, Non-Silicone Breast Prosthesis, Disp: , Rfl: ;  NON FORMULARY, Silicone Breast Prosthesis, Disp: , Rfl: ;  nortriptyline (PAMELOR) 10 MG capsule, Take 1 capsule (10 mg total) by mouth at bedtime., Disp: 90 capsule, Rfl: 3;  potassium chloride SA (K-DUR,KLOR-CON) 20 MEQ tablet, Take 1 tablet (20 mEq total) by mouth daily., Disp: 90 tablet, Rfl: 3;  simvastatin (ZOCOR) 20 MG tablet, TAKE 1 TABLET AT BEDTIME, Disp: 90 tablet, Rfl: 0 timolol (BETIMOL) 0.5 % ophthalmic solution, Place 1 drop into both eyes 2 (two) times daily., Disp: , Rfl: ;  tobramycin (TOBREX) 0.3 % ophthalmic ointment, Place 1 application into the left eye  as needed., Disp: , Rfl: ;  UNABLE TO FIND, 174.9 Left mastectomy   L8030-Silicone Breast Prosthesis-1, Disp: 1 each, Rfl: 0;  [DISCONTINUED] fesoterodine (TOVIAZ) 4 MG TB24, Take 4 mg by mouth daily., Disp: , Rfl:   EXAM:  Filed Vitals:   08/27/14 1103  BP: 150/82  Pulse: 75  Temp: 97.4 F (36.3 C)    Body mass index is 24.88 kg/(m^2).  GENERAL: vitals reviewed and listed above, alert, oriented, appears well hydrated and in no acute distress  HEENT: atraumatic, conjunttiva clear, no obvious abnormalities on inspection of external nose and ears  NECK: no obvious masses on inspection  LUNGS: clear to auscultation bilaterally, no wheezes, rales or rhonchi, good air movement  CV: HRRR, SEM II/VI (reports she has always had this) no peripheral edema  MS: moves all extremities without noticeable abnormality  PSYCH: pleasant and cooperative, no obvious depression or anxiety  ASSESSMENT AND PLAN:  Discussed the following assessment and plan:  Malignant neoplasm of female breast, left  Urinary incontinence, unspecified incontinence type  HYPERCHOLESTEROLEMIA  Essential hypertension  Cerebrovascular disease  Glaucoma  Anxiety and depression  Gastroesophageal reflux disease, esophagitis presence not specified  Constipation, chronic  Chronic kidney disease (CKD), stage II (mild)  Allergic rhinitis, unspecified allergic rhinitis type  -We reviewed the PMH, PSH, FH, SH, Meds and Allergies. -We provided refills for any medications we will prescribe as needed. -We addressed current concerns per orders and patient instructions. -We have asked for records for pertinent exams, studies, vaccines and notes from previous providers. -We have advised patient to follow up per instructions below. -flu and prevnar 13 given  -Patient advised to return or notify a doctor immediately if symptoms worsen or persist or new concerns arise.  Patient Instructions  -SCHEDULE MEDICARE  WELLNESS EXAM as you leave today - come fasting to that appointment but drink plenty of water and we will check you labs  -for the constipation: take a fiber supplement every day and use mirilax once per day per instructions if constipated for a few days until soft stools       KIM, HANNAH R.

## 2014-08-27 NOTE — Progress Notes (Signed)
Pre visit review using our clinic review tool, if applicable. No additional management support is needed unless otherwise documented below in the visit note. 

## 2014-08-27 NOTE — Patient Instructions (Signed)
-  SCHEDULE MEDICARE WELLNESS EXAM as you leave today - come fasting to that appointment but drink plenty of water and we will check you labs  -for the constipation: take a fiber supplement every day and use mirilax once per day per instructions if constipated for a few days until soft stools

## 2014-08-28 ENCOUNTER — Telehealth: Payer: Self-pay | Admitting: Pulmonary Disease

## 2014-08-28 NOTE — Telephone Encounter (Signed)
emmi mailed  °

## 2014-09-12 LAB — HM MAMMOGRAPHY

## 2014-09-24 ENCOUNTER — Encounter: Payer: Self-pay | Admitting: Family Medicine

## 2014-09-27 ENCOUNTER — Encounter: Payer: Self-pay | Admitting: Family Medicine

## 2014-10-04 ENCOUNTER — Encounter: Payer: Self-pay | Admitting: Hematology and Oncology

## 2014-10-04 ENCOUNTER — Ambulatory Visit (HOSPITAL_BASED_OUTPATIENT_CLINIC_OR_DEPARTMENT_OTHER): Payer: Medicare HMO | Admitting: Hematology and Oncology

## 2014-10-04 VITALS — BP 168/78 | HR 101 | Temp 97.6°F | Resp 18 | Ht <= 58 in | Wt 122.5 lb

## 2014-10-04 DIAGNOSIS — I10 Essential (primary) hypertension: Secondary | ICD-10-CM

## 2014-10-04 DIAGNOSIS — C50912 Malignant neoplasm of unspecified site of left female breast: Secondary | ICD-10-CM

## 2014-10-04 NOTE — Assessment & Plan Note (Signed)
Clinically, she has completed 5 years worth of adjuvant endocrine therapy. She has no evidence of disease recurrence. I told her to finish off her current prescription of anastrozole and then do not refill further. Given her age, I felt that she can discontinue mammogram after this year. I discussed with her regarding follow-up. She is comfortable to follow with her PCP with yearly annual examination with her.

## 2014-10-04 NOTE — Progress Notes (Signed)
[facility] OFFICE PROGRESS NOTE  Patient Care Team: Lucretia Kern, DO as PCP - General (Family Medicine) Lafayette Dragon, MD (Gastroenterology) Heath Lark, MD as Consulting Physician (Hematology and Oncology) DIAGNOSIS: Left breast cancer T2, N1, M0, ongoing adjuvant endocrine therapy  SUMMARY OF ONCOLOGIC HISTORY: This is a pleasant 80 year old lady with background history of breast cancer. According to the patient, it was discovered from screening mammogram. She underwent mastectomy in November of 2010 with sentinel lymph node biopsy. She's been placed on Arimidex since November of 2010  INTERVAL HISTORY: Alison Cordova 80 y.o. female returns for further followup. She tolerated treatment well without major side effects such as hot flashes, mood swings, myalgias or arthralgias She denies any recent abnormal breast examination, palpable mass, abnormal breast appearance or nipple changes  REVIEW OF SYSTEMS:   Constitutional: Denies fevers, chills or abnormal weight loss Eyes: Denies blurriness of vision Ears, nose, mouth, throat, and face: Denies mucositis or sore throat Respiratory: Denies cough, dyspnea or wheezes Cardiovascular: Denies palpitation, chest discomfort or lower extremity swelling Gastrointestinal:  Denies nausea, heartburn or change in bowel habits Skin: Denies abnormal skin rashes Lymphatics: Denies new lymphadenopathy or easy bruising Neurological:Denies numbness, tingling or new weaknesses Behavioral/Psych: Mood is stable, no new changes  All other systems were reviewed with the patient and are negative.  I have reviewed the past medical history, past surgical history, social history and family history with the patient and they are unchanged from previous note.  ALLERGIES:  has No Known Allergies.  MEDICATIONS:  Current Outpatient Prescriptions  Medication Sig Dispense Refill  . acetaminophen (TYLENOL) 650 MG CR tablet Take 650 mg by mouth every 8  (eight) hours as needed.      Marland Kitchen anastrozole (ARIMIDEX) 1 MG tablet TAKE 1 TABLET EVERY DAY 90 tablet 0  . aspirin 325 MG tablet Take 325 mg by mouth daily.      Marland Kitchen atenolol-chlorthalidone (TENORETIC) 50-25 MG per tablet TAKE 1 TABLET EVERY DAY 90 tablet 0  . cetirizine (ZYRTEC) 10 MG tablet Take 1 tablet (10 mg total) by mouth daily. 90 tablet 3  . famotidine (PEPCID) 20 MG tablet TAKE 1 TABLET TWICE DAILY AS NEEDED 180 tablet 0  . fesoterodine (TOVIAZ) 4 MG TB24 tablet Take 1 tablet (4 mg total) by mouth daily. 90 tablet 3  . fish oil-omega-3 fatty acids 1000 MG capsule Take 2 g by mouth daily.      . fluocinonide cream (LIDEX) 0.05 % Apply topically 2 (two) times daily. 90 g 3  . fluticasone (FLONASE) 50 MCG/ACT nasal spray Place 2 sprays into both nostrils daily. 48 g 3  . fluvoxaMINE (LUVOX) 100 MG tablet Take as directed by Dr. Abner Greenspan    . latanoprost (XALATAN) 0.005 % ophthalmic solution Place 1 drop into both eyes at bedtime.      Marland Kitchen levothyroxine (SYNTHROID, LEVOTHROID) 50 MCG tablet TAKE 1 TABLET EVERY DAY 90 tablet 0  . lisinopril (PRINIVIL,ZESTRIL) 20 MG tablet TAKE 1 TABLET EVERY DAY 90 tablet 0  . mirabegron ER (MYRBETRIQ) 50 MG TB24 tablet Take 50 mg by mouth every other day.    . Multiple Vitamins-Minerals (WOMENS MULTIVITAMIN PLUS PO) Take 1 tablet by mouth daily.      . NON FORMULARY Post Surgical Bras    . NON FORMULARY Non-Silicone Breast Prosthesis    . NON FORMULARY Silicone Breast Prosthesis    . nortriptyline (PAMELOR) 10 MG capsule Take 1 capsule (10 mg total) by mouth  at bedtime. 90 capsule 3  . potassium chloride SA (K-DUR,KLOR-CON) 20 MEQ tablet Take 1 tablet (20 mEq total) by mouth daily. 90 tablet 3  . simvastatin (ZOCOR) 20 MG tablet TAKE 1 TABLET AT BEDTIME 90 tablet 0  . timolol (BETIMOL) 0.5 % ophthalmic solution Place 1 drop into both eyes 2 (two) times daily.    Marland Kitchen tobramycin (TOBREX) 0.3 % ophthalmic ointment Place 1 application into the left eye as needed.     Marland Kitchen UNABLE TO FIND 174.9 Left mastectomy   L8030-Silicone Breast Prosthesis-1 1 each 0  . [DISCONTINUED] fesoterodine (TOVIAZ) 4 MG TB24 Take 4 mg by mouth daily.     No current facility-administered medications for this visit.    PHYSICAL EXAMINATION: ECOG PERFORMANCE STATUS: 0 - Asymptomatic  Filed Vitals:   10/04/14 0812  BP: 168/78  Pulse: 101  Temp: 97.6 F (36.4 C)  Resp: 18   Filed Weights   10/04/14 0812  Weight: 122 lb 8 oz (55.566 kg)    GENERAL:alert, no distress and comfortable SKIN: skin color, texture, turgor are normal, no rashes or significant lesions EYES: normal, Conjunctiva are pink and non-injected, sclera clear OROPHARYNX:no exudate, no erythema and lips, buccal mucosa, and tongue normal  NECK: supple, thyroid normal size, non-tender, without nodularity LYMPH:  no palpable lymphadenopathy in the cervical, axillary or inguinal LUNGS: clear to auscultation and percussion with normal breathing effort HEART: regular rate & rhythm and no murmurs and no lower extremity edema ABDOMEN:abdomen soft, non-tender and normal bowel sounds Musculoskeletal:no cyanosis of digits and no clubbing  NEURO: alert & oriented x 3 with fluent speech, no focal motor/sensory deficits Well-healed mastectomy scar on the left without any abnormalities. Normal breast exam on the right. LABORATORY DATA:  I have reviewed the data as listed    Component Value Date/Time   NA 140 02/21/2014 1041   NA 140 12/19/2012 0817   K 3.9 02/21/2014 1041   K 3.9 12/19/2012 0817   CL 102 02/21/2014 1041   CL 102 12/19/2012 0817   CO2 31 02/21/2014 1041   CO2 27 12/19/2012 0817   GLUCOSE 100* 02/21/2014 1041   GLUCOSE 104* 12/19/2012 0817   BUN 20 02/21/2014 1041   BUN 25.3 12/19/2012 0817   CREATININE 1.4* 02/21/2014 1041   CREATININE 1.7* 12/19/2012 0817   CALCIUM 10.3 02/21/2014 1041   CALCIUM 10.4 12/19/2012 0817   PROT 7.9 02/21/2014 1041   PROT 7.8 12/19/2012 0817   ALBUMIN 4.8  02/21/2014 1041   ALBUMIN 4.2 12/19/2012 0817   AST 24 02/21/2014 1041   AST 47* 12/19/2012 0817   ALT 12 02/21/2014 1041   ALT 39 12/19/2012 0817   ALKPHOS 45 02/21/2014 1041   ALKPHOS 51 12/19/2012 0817   BILITOT 0.6 02/21/2014 1041   BILITOT 0.49 12/19/2012 0817   GFRNONAA 42* 06/09/2010 0900   GFRAA * 06/09/2010 0900    51        The eGFR has been calculated using the MDRD equation. This calculation has not been validated in all clinical situations. eGFR's persistently <60 mL/min signify possible Chronic Kidney Disease.    No results found for: SPEP, UPEP  Lab Results  Component Value Date   WBC 4.5 02/21/2014   NEUTROABS 2.2 02/21/2014   HGB 12.3 02/21/2014   HCT 36.1 02/21/2014   MCV 95.6 02/21/2014   PLT 254.0 02/21/2014      Chemistry      Component Value Date/Time   NA 140 02/21/2014 1041  NA 140 12/19/2012 0817   K 3.9 02/21/2014 1041   K 3.9 12/19/2012 0817   CL 102 02/21/2014 1041   CL 102 12/19/2012 0817   CO2 31 02/21/2014 1041   CO2 27 12/19/2012 0817   BUN 20 02/21/2014 1041   BUN 25.3 12/19/2012 0817   CREATININE 1.4* 02/21/2014 1041   CREATININE 1.7* 12/19/2012 0817      Component Value Date/Time   CALCIUM 10.3 02/21/2014 1041   CALCIUM 10.4 12/19/2012 0817   ALKPHOS 45 02/21/2014 1041   ALKPHOS 51 12/19/2012 0817   AST 24 02/21/2014 1041   AST 47* 12/19/2012 0817   ALT 12 02/21/2014 1041   ALT 39 12/19/2012 0817   BILITOT 0.6 02/21/2014 1041   BILITOT 0.49 12/19/2012 0817      ASSESSMENT & PLAN:  Malignant neoplasm of female breast Clinically, she has completed 5 years worth of adjuvant endocrine therapy. She has no evidence of disease recurrence. I told her to finish off her current prescription of anastrozole and then do not refill further. Given her age, I felt that she can discontinue mammogram after this year. I discussed with her regarding follow-up. She is comfortable to follow with her PCP with yearly annual examination  with her.  Essential hypertension Her blood pressure is high. Multiple blood pressure readings in the office were high. I recommend she discuss blood pressure management with her PCP.    No orders of the defined types were placed in this encounter.   All questions were answered. The patient knows to call the clinic with any problems, questions or concerns. No barriers to learning was detected. I spent 15 minutes counseling the patient face to face. The total time spent in the appointment was 20 minutes and more than 50% was on counseling and review of test results     [facility], [place], MD 10/04/2014 8:17 AM

## 2014-10-04 NOTE — Assessment & Plan Note (Signed)
Her blood pressure is high. Multiple blood pressure readings in the office were high. I recommend she discuss blood pressure management with her PCP.

## 2014-10-05 ENCOUNTER — Encounter: Payer: Self-pay | Admitting: Family Medicine

## 2014-10-05 ENCOUNTER — Ambulatory Visit (INDEPENDENT_AMBULATORY_CARE_PROVIDER_SITE_OTHER): Payer: Medicare HMO | Admitting: Family Medicine

## 2014-10-05 VITALS — BP 162/84 | HR 100 | Temp 97.6°F | Ht <= 58 in | Wt 122.0 lb

## 2014-10-05 DIAGNOSIS — I10 Essential (primary) hypertension: Secondary | ICD-10-CM

## 2014-10-05 MED ORDER — AMLODIPINE BESYLATE 2.5 MG PO TABS: 2.5000 mg | ORAL_TABLET | Freq: Every day | ORAL | Status: DC

## 2014-10-05 NOTE — Patient Instructions (Signed)
ADD a new medication: Norvasc 2.5 mg Follow up in 4 weeks

## 2014-10-05 NOTE — Progress Notes (Signed)
HPI:  Follow up:  HTN: -elevated BP at onc visit and we called her to come in -cuurent meds include: atenolol-chlothalidone 50-25mg , lisinopril 20mg   from prior PCP -reports: dong great otherwise -denies: CP, trouble breathing, DOE, swelling, palpitations  ROS: See pertinent positives and negatives per HPI.  Past Medical History  Diagnosis Date  . Allergy     SEASONAL  . Depression   . Anxiety   . Arthritis   . Cancer     LEFT BREAST CANCER  . Cataract   . Stroke   . Glaucoma   . Hyperlipidemia   . Hypertension   . Thyroid disease   . Anemia 06/17/2012  . Chronic kidney disease (CKD), stage II (mild) 06/17/2012  . Hemorrhoids 07/03/2011  . BRONCHITIS, RECURRENT 03/14/2008    Qualifier: Diagnosis of  By: Lenna Gilford MD, Deborra Medina   . Constipation 08/26/2012  . DIVERTICULOSIS OF COLON 03/14/2008    Qualifier: Diagnosis of  By: Lenna Gilford MD, Deborra Medina   . Irritable bowel syndrome 09/17/2007    Qualifier: Diagnosis of  By: Rosana Hoes CMA, Tammy    . Lung nodule 07/06/2011    Past Surgical History  Procedure Laterality Date  . Vaginal hysterectomy    . Spine surgery    .  fundiplication    . Eye surgery      TO DESOLVE AN AREA IN HER LEFT EYE?  . Breast surgery      left breast mastectomy     No family history on file.  History   Social History  . Marital Status: Widowed    Spouse Name: N/A    Number of Children: N/A  . Years of Education: N/A   Social History Main Topics  . Smoking status: Former Smoker    Types: Cigarettes    Quit date: 11/23/1962  . Smokeless tobacco: Never Used  . Alcohol Use: Yes     Comment: PT STATES DRINKS A BEER OR WINE OCCASSIONALLY, NOT WEEKLY  . Drug Use: No  . Sexual Activity: None   Other Topics Concern  . None   Social History Narrative   Work or School: none      Home Situation: lives with daughter and son and law      Spiritual Beliefs: [employer]      Lifestyle: no regular exercise; diet is ok             Current  outpatient prescriptions: acetaminophen (TYLENOL) 650 MG CR tablet, Take 650 mg by mouth every 8 (eight) hours as needed.  , Disp: , Rfl: ;  amLODipine (NORVASC) 2.5 MG tablet, Take 1 tablet (2.5 mg total) by mouth daily., Disp: 90 tablet, Rfl: 3;  anastrozole (ARIMIDEX) 1 MG tablet, TAKE 1 TABLET EVERY DAY, Disp: 90 tablet, Rfl: 0;  aspirin 325 MG tablet, Take 325 mg by mouth daily.  , Disp: , Rfl:  atenolol-chlorthalidone (TENORETIC) 50-25 MG per tablet, TAKE 1 TABLET EVERY DAY, Disp: 90 tablet, Rfl: 0;  cetirizine (ZYRTEC) 10 MG tablet, Take 1 tablet (10 mg total) by mouth daily., Disp: 90 tablet, Rfl: 3;  famotidine (PEPCID) 20 MG tablet, TAKE 1 TABLET TWICE DAILY AS NEEDED, Disp: 180 tablet, Rfl: 0;  fesoterodine (TOVIAZ) 4 MG TB24 tablet, Take 1 tablet (4 mg total) by mouth daily., Disp: 90 tablet, Rfl: 3 fish oil-omega-3 fatty acids 1000 MG capsule, Take 2 g by mouth daily.  , Disp: , Rfl: ;  fluocinonide cream (LIDEX) 0.05 %, Apply topically 2 (two) times  daily., Disp: 90 g, Rfl: 3;  fluticasone (FLONASE) 50 MCG/ACT nasal spray, Place 2 sprays into both nostrils daily., Disp: 48 g, Rfl: 3;  fluvoxaMINE (LUVOX) 100 MG tablet, Take as directed by Dr. Abner Greenspan, Disp: , Rfl:  latanoprost (XALATAN) 0.005 % ophthalmic solution, Place 1 drop into both eyes at bedtime.  , Disp: , Rfl: ;  levothyroxine (SYNTHROID, LEVOTHROID) 50 MCG tablet, TAKE 1 TABLET EVERY DAY, Disp: 90 tablet, Rfl: 0;  lisinopril (PRINIVIL,ZESTRIL) 20 MG tablet, TAKE 1 TABLET EVERY DAY, Disp: 90 tablet, Rfl: 0;  mirabegron ER (MYRBETRIQ) 50 MG TB24 tablet, Take 50 mg by mouth every other day., Disp: , Rfl:  Multiple Vitamins-Minerals (WOMENS MULTIVITAMIN PLUS PO), Take 1 tablet by mouth daily.  , Disp: , Rfl: ;  NON FORMULARY, Post Surgical Bras, Disp: , Rfl: ;  NON FORMULARY, Non-Silicone Breast Prosthesis, Disp: , Rfl: ;  NON FORMULARY, Silicone Breast Prosthesis, Disp: , Rfl: ;  nortriptyline (PAMELOR) 10 MG capsule, Take 1 capsule (10 mg  total) by mouth at bedtime., Disp: 90 capsule, Rfl: 3 potassium chloride SA (K-DUR,KLOR-CON) 20 MEQ tablet, Take 1 tablet (20 mEq total) by mouth daily., Disp: 90 tablet, Rfl: 3;  simvastatin (ZOCOR) 20 MG tablet, TAKE 1 TABLET AT BEDTIME, Disp: 90 tablet, Rfl: 0;  timolol (BETIMOL) 0.5 % ophthalmic solution, Place 1 drop into both eyes 2 (two) times daily., Disp: , Rfl: ;  tobramycin (TOBREX) 0.3 % ophthalmic ointment, Place 1 application into the left eye as needed., Disp: , Rfl:  UNABLE TO FIND, 174.9 Left mastectomy   L8030-Silicone Breast Prosthesis-1, Disp: 1 each, Rfl: 0;  [DISCONTINUED] fesoterodine (TOVIAZ) 4 MG TB24, Take 4 mg by mouth daily., Disp: , Rfl:   EXAM:  Filed Vitals:   10/05/14 0925  BP: 162/84  Pulse: 100  Temp: 97.6 F (36.4 C)    Body mass index is 25.51 kg/(m^2).  GENERAL: vitals reviewed and listed above, alert, oriented, appears well hydrated and in no acute distress  HEENT: atraumatic, conjunttiva clear, no obvious abnormalities on inspection of external nose and ears  NECK: no obvious masses on inspection  LUNGS: clear to auscultation bilaterally, no wheezes, rales or rhonchi, good air movement  CV: HRRR, no peripheral edema  MS: moves all extremities without noticeable abnormality  PSYCH: pleasant and cooperative, no obvious depression or anxiety  ASSESSMENT AND PLAN:  Discussed the following assessment and plan:  Essential hypertension - Plan: amLODipine (NORVASC) 2.5 MG tablet  -BP 152/88 on recheck -disucussed options and she decided to add low dose norvasc, follow up in 4 weeks -fall prevention strategies discussed, advised regular exercise, offered PT and advised cane or walker -Patient advised to return or notify a doctor immediately if symptoms worsen or persist or new concerns arise.  Patient Instructions  ADD a new medication: Norvasc 2.5 mg Follow up in 4 weeks     Angie Piercey R.

## 2014-10-05 NOTE — Progress Notes (Signed)
Pre visit review using our clinic review tool, if applicable. No additional management support is needed unless otherwise documented below in the visit note. 

## 2014-11-02 ENCOUNTER — Encounter: Payer: Self-pay | Admitting: Family Medicine

## 2014-11-02 ENCOUNTER — Ambulatory Visit (INDEPENDENT_AMBULATORY_CARE_PROVIDER_SITE_OTHER): Payer: Commercial Managed Care - HMO | Admitting: Family Medicine

## 2014-11-02 VITALS — BP 136/78 | HR 90 | Temp 97.5°F | Ht <= 58 in | Wt 120.4 lb

## 2014-11-02 DIAGNOSIS — I10 Essential (primary) hypertension: Secondary | ICD-10-CM

## 2014-11-02 DIAGNOSIS — E7800 Pure hypercholesterolemia, unspecified: Secondary | ICD-10-CM

## 2014-11-02 DIAGNOSIS — E038 Other specified hypothyroidism: Secondary | ICD-10-CM

## 2014-11-02 LAB — BASIC METABOLIC PANEL
BUN: 17 mg/dL (ref 6–23)
CO2: 26 mEq/L (ref 19–32)
Calcium: 9.9 mg/dL (ref 8.4–10.5)
Chloride: 103 mEq/L (ref 96–112)
Creatinine, Ser: 1.3 mg/dL — ABNORMAL HIGH (ref 0.4–1.2)
GFR: 40.9 mL/min — ABNORMAL LOW (ref >60.00)
Glucose, Bld: 91 mg/dL (ref 70–99)
Potassium: 4.1 mEq/L (ref 3.5–5.1)
Sodium: 136 mEq/L (ref 135–145)

## 2014-11-02 LAB — TSH: TSH: 1.4 u[IU]/mL (ref 0.35–4.50)

## 2014-11-02 NOTE — Progress Notes (Signed)
HPI:  Follow up HTN: -meds: atenolol-chlorthalidone 50-25 from prior PCP, lisinopril 20mg , norvasc added 09/2012 -doing well -denies: CP, SOB, swelling, HA  HLD: -on statin, doing well, not fasting -occ leg cramps -no cog impairment on this  Hypothyroid: -stable -denies: hot/cold intol, weight changes  ROS: See pertinent positives and negatives per HPI.  Past Medical History  Diagnosis Date  . Allergy     SEASONAL  . Depression   . Anxiety   . Arthritis   . Cancer     LEFT BREAST CANCER  . Cataract   . Stroke   . Glaucoma   . Hyperlipidemia   . Hypertension   . Thyroid disease   . Anemia 06/17/2012  . Chronic kidney disease (CKD), stage II (mild) 06/17/2012  . Hemorrhoids 07/03/2011  . BRONCHITIS, RECURRENT 03/14/2008    Qualifier: Diagnosis of  By: Lenna Gilford MD, Deborra Medina   . Constipation 08/26/2012  . DIVERTICULOSIS OF COLON 03/14/2008    Qualifier: Diagnosis of  By: Lenna Gilford MD, Deborra Medina   . Irritable bowel syndrome 09/17/2007    Qualifier: Diagnosis of  By: Rosana Hoes CMA, Tammy    . Lung nodule 07/06/2011    Past Surgical History  Procedure Laterality Date  . Vaginal hysterectomy    . Spine surgery    .  fundiplication    . Eye surgery      TO DESOLVE AN AREA IN HER LEFT EYE?  . Breast surgery      left breast mastectomy     No family history on file.  History   Social History  . Marital Status: Widowed    Spouse Name: N/A    Number of Children: N/A  . Years of Education: N/A   Social History Main Topics  . Smoking status: Former Smoker    Types: Cigarettes    Quit date: 11/23/1962  . Smokeless tobacco: Never Used  . Alcohol Use: Yes     Comment: PT STATES DRINKS A BEER OR WINE OCCASSIONALLY, NOT WEEKLY  . Drug Use: No  . Sexual Activity: None   Other Topics Concern  . None   Social History Narrative   Work or School: none      Home Situation: lives with daughter and son and law      Spiritual Beliefs: [employer]      Lifestyle: no regular  exercise; diet is ok             Current outpatient prescriptions: acetaminophen (TYLENOL) 650 MG CR tablet, Take 650 mg by mouth every 8 (eight) hours as needed.  , Disp: , Rfl: ;  amLODipine (NORVASC) 2.5 MG tablet, Take 1 tablet (2.5 mg total) by mouth daily., Disp: 90 tablet, Rfl: 3;  anastrozole (ARIMIDEX) 1 MG tablet, TAKE 1 TABLET EVERY DAY, Disp: 90 tablet, Rfl: 0;  aspirin 325 MG tablet, Take 325 mg by mouth daily.  , Disp: , Rfl:  atenolol-chlorthalidone (TENORETIC) 50-25 MG per tablet, TAKE 1 TABLET EVERY DAY, Disp: 90 tablet, Rfl: 0;  cetirizine (ZYRTEC) 10 MG tablet, Take 1 tablet (10 mg total) by mouth daily., Disp: 90 tablet, Rfl: 3;  famotidine (PEPCID) 20 MG tablet, TAKE 1 TABLET TWICE DAILY AS NEEDED, Disp: 180 tablet, Rfl: 0;  fesoterodine (TOVIAZ) 4 MG TB24 tablet, Take 1 tablet (4 mg total) by mouth daily., Disp: 90 tablet, Rfl: 3 fish oil-omega-3 fatty acids 1000 MG capsule, Take 2 g by mouth daily.  , Disp: , Rfl: ;  fluocinonide cream (LIDEX)  0.05 %, Apply topically 2 (two) times daily., Disp: 90 g, Rfl: 3;  fluticasone (FLONASE) 50 MCG/ACT nasal spray, Place 2 sprays into both nostrils daily., Disp: 48 g, Rfl: 3;  fluvoxaMINE (LUVOX) 100 MG tablet, Take as directed by Dr. Abner Greenspan, Disp: , Rfl:  latanoprost (XALATAN) 0.005 % ophthalmic solution, Place 1 drop into both eyes at bedtime.  , Disp: , Rfl: ;  levothyroxine (SYNTHROID, LEVOTHROID) 50 MCG tablet, TAKE 1 TABLET EVERY DAY, Disp: 90 tablet, Rfl: 0;  lisinopril (PRINIVIL,ZESTRIL) 20 MG tablet, TAKE 1 TABLET EVERY DAY, Disp: 90 tablet, Rfl: 0;  mirabegron ER (MYRBETRIQ) 50 MG TB24 tablet, Take 50 mg by mouth every other day., Disp: , Rfl:  Multiple Vitamins-Minerals (WOMENS MULTIVITAMIN PLUS PO), Take 1 tablet by mouth daily.  , Disp: , Rfl: ;  NON FORMULARY, Post Surgical Bras, Disp: , Rfl: ;  NON FORMULARY, Non-Silicone Breast Prosthesis, Disp: , Rfl: ;  NON FORMULARY, Silicone Breast Prosthesis, Disp: , Rfl: ;  nortriptyline  (PAMELOR) 10 MG capsule, Take 1 capsule (10 mg total) by mouth at bedtime., Disp: 90 capsule, Rfl: 3 potassium chloride SA (K-DUR,KLOR-CON) 20 MEQ tablet, Take 1 tablet (20 mEq total) by mouth daily., Disp: 90 tablet, Rfl: 3;  simvastatin (ZOCOR) 20 MG tablet, TAKE 1 TABLET AT BEDTIME, Disp: 90 tablet, Rfl: 0;  timolol (BETIMOL) 0.5 % ophthalmic solution, Place 1 drop into both eyes 2 (two) times daily., Disp: , Rfl: ;  tobramycin (TOBREX) 0.3 % ophthalmic ointment, Place 1 application into the left eye as needed., Disp: , Rfl:  UNABLE TO FIND, 174.9 Left mastectomy   L8030-Silicone Breast Prosthesis-1, Disp: 1 each, Rfl: 0;  [DISCONTINUED] fesoterodine (TOVIAZ) 4 MG TB24, Take 4 mg by mouth daily., Disp: , Rfl:   EXAM:  Filed Vitals:   11/02/14 0853  BP: 136/78  Pulse: 90  Temp: 97.5 F (36.4 C)    Body mass index is 25.17 kg/(m^2).  GENERAL: vitals reviewed and listed above, alert, oriented, appears well hydrated and in no acute distress  HEENT: atraumatic, conjunttiva clear, no obvious abnormalities on inspection of external nose and ears  NECK: no obvious masses on inspection  LUNGS: clear to auscultation bilaterally, no wheezes, rales or rhonchi, good air movement  CV: HRRR, no peripheral edema  MS: moves all extremities without noticeable abnormality  PSYCH: pleasant and cooperative, no obvious depression or anxiety  ASSESSMENT AND PLAN:  Discussed the following assessment and plan:  Essential hypertension - Plan: Basic metabolic panel  Other specified hypothyroidism - Plan: TSH  HYPERCHOLESTEROLEMIA  -doing well, blood pressure good on recheck -check TSH and BMP today, not fasting so lipids at CPE -follow up as scheduled -Patient advised to return or notify a doctor immediately if symptoms worsen or persist or new concerns arise.  There are no Patient Instructions on file for this visit.   Colin Benton R.

## 2014-11-02 NOTE — Progress Notes (Signed)
Pre visit review using our clinic review tool, if applicable. No additional management support is needed unless otherwise documented below in the visit note. 

## 2014-11-12 IMAGING — CR DG CHEST 2V
2 series · 2 of 2 positions shown · non-contrast
Comparison: 07/20/2011 CT.  Plain film 07/03/2011

CLINICAL DATA: Hypertension.  Ex-smoker.

CHEST - 2 VIEW

[view not recorded (1 of 2)]
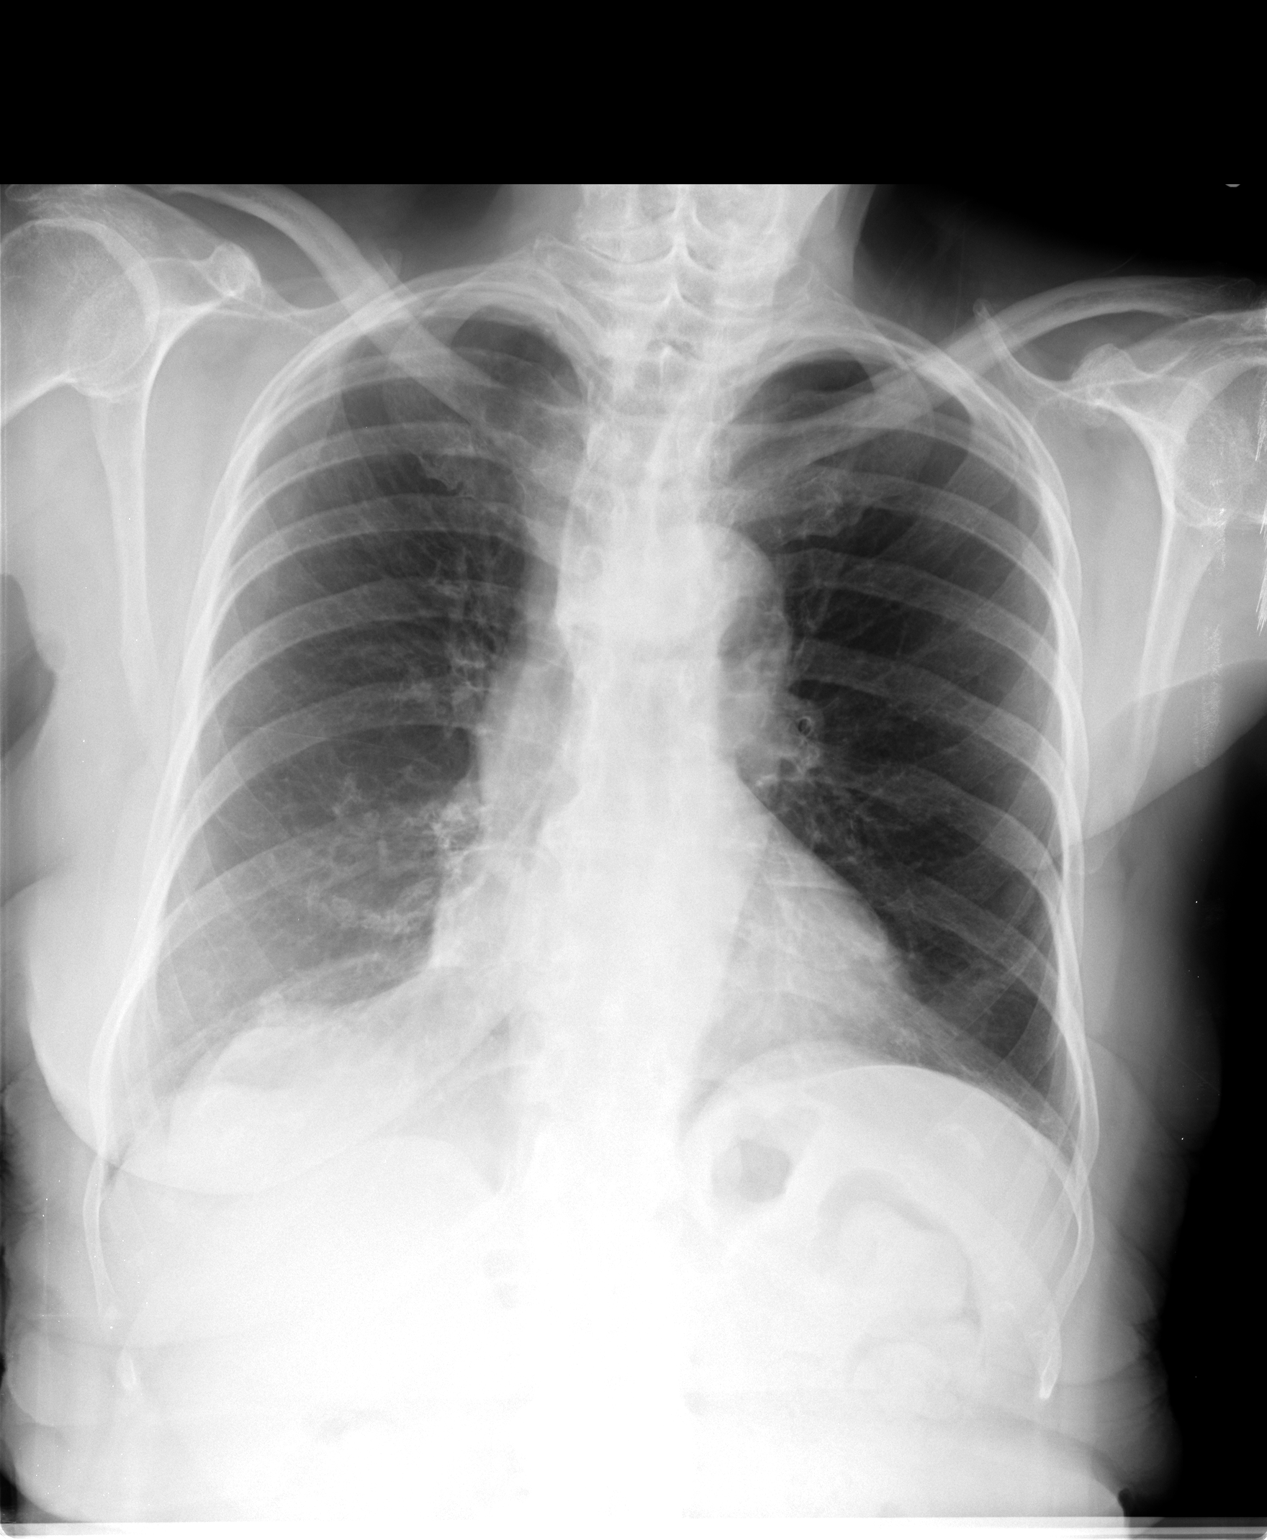

[view not recorded (2 of 2)]
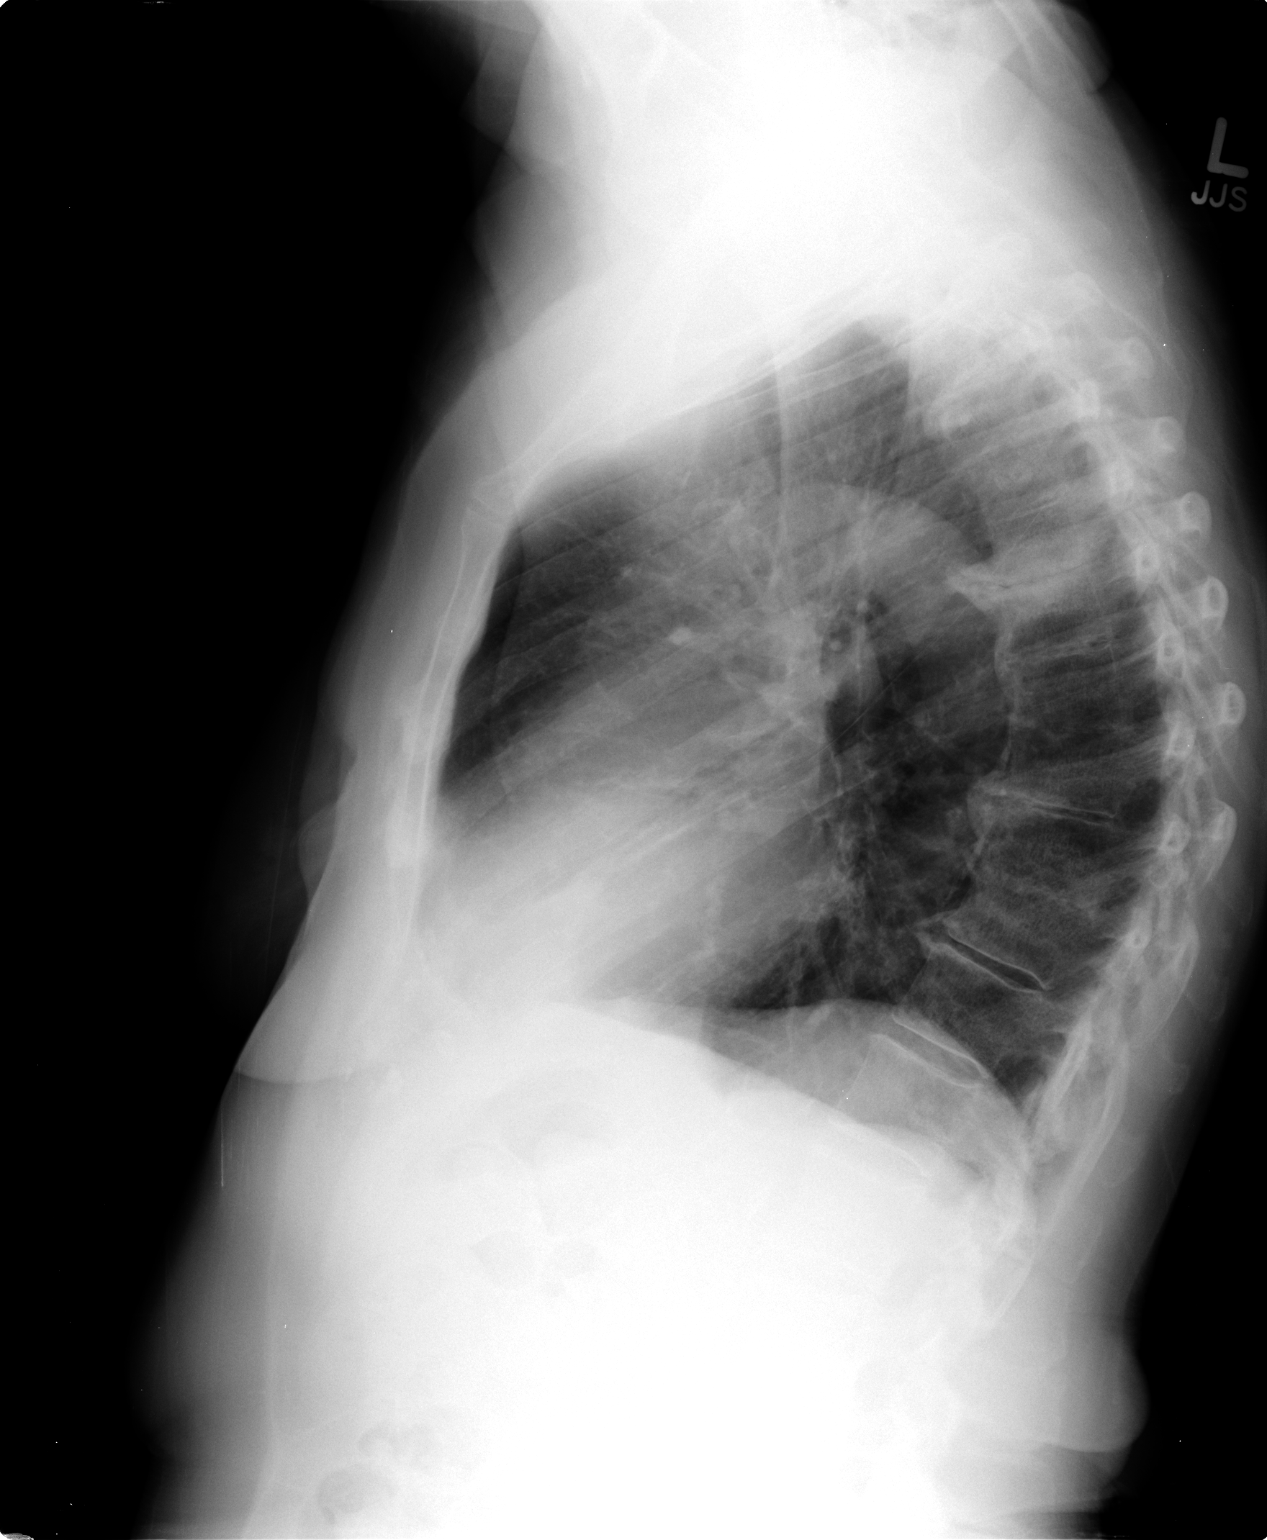

[2 of 2 positions shown; findings below may reference images not displayed]

FINDINGS: Moderate thoracic spondylosis. Midline trachea.
Borderline cardiomegaly, with a mildly tortuous thoracic aorta. No
pleural effusion or pneumothorax.  Clear lungs.
IMPRESSION: No acute cardiopulmonary disease.

## 2014-11-28 ENCOUNTER — Encounter: Payer: Self-pay | Admitting: Family Medicine

## 2014-11-28 ENCOUNTER — Ambulatory Visit (INDEPENDENT_AMBULATORY_CARE_PROVIDER_SITE_OTHER): Payer: Commercial Managed Care - HMO | Admitting: Family Medicine

## 2014-11-28 VITALS — BP 144/80 | HR 80 | Temp 97.5°F | Ht 58.75 in | Wt 120.6 lb

## 2014-11-28 DIAGNOSIS — E7800 Pure hypercholesterolemia, unspecified: Secondary | ICD-10-CM

## 2014-11-28 DIAGNOSIS — E038 Other specified hypothyroidism: Secondary | ICD-10-CM

## 2014-11-28 DIAGNOSIS — Z0000 Encounter for general adult medical examination without abnormal findings: Secondary | ICD-10-CM

## 2014-11-28 DIAGNOSIS — I10 Essential (primary) hypertension: Secondary | ICD-10-CM

## 2014-11-28 DIAGNOSIS — N182 Chronic kidney disease, stage 2 (mild): Secondary | ICD-10-CM

## 2014-11-28 LAB — LIPID PANEL
Cholesterol: 195 mg/dL (ref 0–200)
HDL: 60.8 mg/dL (ref >39.00)
LDL CALC: 105 mg/dL — AB (ref 0–99)
NONHDL: 134.2
Total CHOL/HDL Ratio: 3
Triglycerides: 144 mg/dL (ref 0.0–149.0)
VLDL: 28.8 mg/dL (ref 0.0–40.0)

## 2014-11-28 NOTE — Progress Notes (Signed)
Reports she is DNR and daughter aware and is HCPOA. Reports daughter aware of her wishes. Requested copy.

## 2014-11-28 NOTE — Patient Instructions (Signed)
BEFORE YOU LEAVE: -shingles vaccine -labs -follow up in 3-4 months  We recommend the following healthy lifestyle measures: - eat a healthy diet consisting of lots of vegetables, fruits, beans, nuts, seeds, healthy meats such as [place] chicken and fish and whole grains.  - avoid fried foods, fast food, processed foods, sodas, red meet and other fattening foods.  - get a least 150 minutes of aerobic exercise per week.

## 2014-11-28 NOTE — Progress Notes (Signed)
Medicare Annual Preventive Care Visit  (initial annual wellness or annual wellness exam)  Concerns and/or follow up today:  HTN: -meds: atenolol-chlorthalidone 50-25 from prior PCP, lisinopril 20mg , norvasc added 09/2012 -doing well -denies: CP, SOB, swelling, HA  HLD: -on statin, doing well, not fasting -occ leg cramps -no cog impairment on this  Hypothyroid: -stable -denies: hot/cold intol, weight changes  Sees opthomologist for her Glaucoma  ROS: negative for report of fevers, unintentional weight loss, vision changes, vision loss, hearing loss or change, chest pain, sob, hemoptysis, melena, hematochezia, hematuria, genital discharge or lesions, falls, bleeding or bruising, loc, thoughts of suicide or self harm, memory loss  1.) Patient-completed health risk assessment  - completed and reviewed, see scanned documentation  2.) Review of Medical History: -PMH, PSH, Family History and current specialty and care providers reviewed and updated and listed below  - see scanned in document in chart and below  Past Medical History  Diagnosis Date  . Allergy     SEASONAL  . Depression   . Anxiety   . Arthritis   . Cancer     LEFT BREAST CANCER  . Cataract   . Stroke   . Glaucoma   . Hyperlipidemia   . Hypertension   . Thyroid disease   . Anemia 06/17/2012  . Chronic kidney disease (CKD), stage II (mild) 06/17/2012  . Hemorrhoids 07/03/2011  . BRONCHITIS, RECURRENT 03/14/2008    Qualifier: Diagnosis of  By: Lenna Gilford MD, Deborra Medina   . Constipation 08/26/2012  . DIVERTICULOSIS OF COLON 03/14/2008    Qualifier: Diagnosis of  By: Lenna Gilford MD, Deborra Medina   . Irritable bowel syndrome 09/17/2007    Qualifier: Diagnosis of  By: Rosana Hoes CMA, Tammy    . Lung nodule 07/06/2011    Past Surgical History  Procedure Laterality Date  . Vaginal hysterectomy    . Spine surgery    .  fundiplication    . Eye surgery      TO DESOLVE AN AREA IN HER LEFT EYE?  . Breast surgery      left breast  mastectomy     History   Social History  . Marital Status: Widowed    Spouse Name: N/A    Number of Children: N/A  . Years of Education: N/A   Occupational History  . Not on file.   Social History Main Topics  . Smoking status: Former Smoker    Types: Cigarettes    Quit date: 11/23/1962  . Smokeless tobacco: Never Used  . Alcohol Use: Yes     Comment: PT STATES DRINKS A BEER OR WINE OCCASSIONALLY, NOT WEEKLY  . Drug Use: No  . Sexual Activity: Not on file   Other Topics Concern  . Not on file   Social History Narrative   Work or School: none      Home Situation: lives with daughter and son and law      Spiritual Beliefs: [employer]      Lifestyle: no regular exercise; diet is ok             The patient has a family history of  3.) Review of functional ability and level of safety:  Any difficulty hearing? YES mild in a noisy room  History of falling? NO  Any trouble with IADLs - using a phone, using transportation, grocery shopping, preparing meals, doing housework, doing laundry, taking medications and managing money? NO - but daughter takes care of most things at home  Advance  Directives? YES - reports has living will and daughter Chauncey Reading, reports DNR, copy requested  See summary of recommendations in Patient Instructions below.  4.) Physical Exam Filed Vitals:   11/28/14 0819  BP: 144/80  Pulse: 80  Temp: 97.5 F (36.4 C)   Estimated body mass index is 24.57 kg/(m^2) as calculated from the following:   Height as of this encounter: 4' 10.75" (1.492 m).   Weight as of this encounter: 120 lb 9.6 oz (54.704 kg).  EKG (optional): deferred  General: alert, appear well hydrated and in no acute distress  HEENT: visual acuity grossly intact  CV: HRRR  Lungs: CTA bilaterally  Psych: pleasant and cooperative, no obvious depression or anxiety  Mini Cog:  Scoring:  Number of words recalled? 3/3 If 3 STOP = negative screen for cognitive  impairment  Patient Score: NEG    See patient instructions for recommendations.  Education and counseling regarding the above review of health provided with a plan for the following: -see scanned patient completed form for further details -fall prevention strategies discussed  -healthy lifestyle discussed -importance and resources for completing advanced directives discussed -see patient instructions below for any other recommendations provided  4)The following written screening schedule of preventive measures were reviewed with assessment and plan made per below, orders and patient instructions:      AAA screening: n/a     Alcohol screening:done     Obesity Screening and counseling: done     STI screening: n/a     Tobacco Screening: done       Pneumococcal (PPSV23 -one dose after 64, one before if risk factors), influenza yearly and hepatitis B vaccines (if high risk - end stage renal disease, IV drugs, homosexual men, live in home for mentally retarded, hemophilia receiving factors) ASSESSMENT/PLAN: prevnar 20 don recently      Screening mammograph (yearly if >40) ASSESSMENT/PLAN: done - sees onc and reports her onc told her she does not have to do the mammograms any longer      Screening Pap smear/pelvic exam (q2 years) ASSESSMENT/PLAN: n/a      Prostate cancer screening ASSESSMENT/PLAN: n/a      Colorectal cancer screening (FOBT yearly or flex sig q4y or colonoscopy q10y or barium enema q4y) ASSESSMENT/PLAN: done      Diabetes outpatient self-management training services ASSESSMENT/PLAN: n/a      Bone mass measurements(covered q2y if indicated - estrogen def, osteoporosis, hyperparathyroid, vertebral abnormalities, osteoporosis or steroids) ASSESSMENT/PLAN:done      Screening for glaucoma(q1y if high risk - diabetes, FH, AA and > 50 or hispanic and > 65) ASSESSMENT/PLAN: sees optho      Medical nutritional therapy for individuals with diabetes or renal  disease ASSESSMENT/PLAN: n/a      Cardiovascular screening blood tests (lipids q5y) ASSESSMENT/PLAN: FASTING for labs      Diabetes screening tests ASSESSMENT/PLAN: FASTING today   7.) Summary: -risk factors and conditions per above assessment were discussed and treatment, recommendations and referrals were offered per documentation above and orders and patient instructions.  Shingles vaccine offered  Medicare annual wellness visit, initial  Other specified hypothyroidism  HYPERCHOLESTEROLEMIA - Plan: Lipid Panel  Essential hypertension  Chronic kidney disease (CKD), stage II (mild)  Patient Instructions  BEFORE YOU LEAVE: -shingles vaccine -labs -follow up in 3-4 months  We recommend the following healthy lifestyle measures: - eat a healthy diet consisting of lots of vegetables, fruits, beans, nuts, seeds, healthy meats such as [place] chicken and fish and whole grains.  -  avoid fried foods, fast food, processed foods, sodas, red meet and other fattening foods.  - get a least 150 minutes of aerobic exercise per week.

## 2014-11-28 NOTE — Progress Notes (Signed)
Pre visit review using our clinic review tool, if applicable. No additional management support is needed unless otherwise documented below in the visit note. 

## 2014-12-19 ENCOUNTER — Telehealth: Payer: Self-pay

## 2014-12-19 MED ORDER — LISINOPRIL 20 MG PO TABS: 20.0000 mg | ORAL_TABLET | Freq: Every day | ORAL | Status: DC

## 2014-12-19 MED ORDER — LEVOTHYROXINE SODIUM 50 MCG PO TABS: 50.0000 ug | ORAL_TABLET | Freq: Every day | ORAL | Status: DC

## 2014-12-19 MED ORDER — ATENOLOL-CHLORTHALIDONE 50-25 MG PO TABS: 1.0000 | ORAL_TABLET | Freq: Every day | ORAL | Status: DC

## 2014-12-19 MED ORDER — SIMVASTATIN 20 MG PO TABS: 20.0000 mg | ORAL_TABLET | Freq: Every day | ORAL | Status: DC

## 2014-12-19 MED ORDER — POTASSIUM CHLORIDE CRYS ER 20 MEQ PO TBCR: 20.0000 meq | EXTENDED_RELEASE_TABLET | Freq: Every day | ORAL | Status: DC

## 2014-12-19 NOTE — Telephone Encounter (Signed)
Rx request for:  Lisinopril 20 mg, Klor-Con M10 Tablet Extended Release, Atenolol 50 mg tablet, Levothyroxine 50 mcg tablet, and Simvastatin 20 mg tablet  Pharm:  [facility]

## 2014-12-19 NOTE — Telephone Encounter (Signed)
Dr Quita Skye Rxs were previously given by Dr Lenna Gilford.

## 2014-12-19 NOTE — Telephone Encounter (Signed)
Rx done. 

## 2014-12-19 NOTE — Telephone Encounter (Signed)
Ok to sent all meds. 90 days. 3 rfs. Thanks.

## 2015-03-28 ENCOUNTER — Encounter: Payer: Self-pay | Admitting: Family Medicine

## 2015-03-28 ENCOUNTER — Ambulatory Visit (INDEPENDENT_AMBULATORY_CARE_PROVIDER_SITE_OTHER): Payer: Commercial Managed Care - HMO | Admitting: Family Medicine

## 2015-03-28 VITALS — BP 130/80 | HR 77 | Temp 98.3°F | Ht 58.75 in | Wt 119.5 lb

## 2015-03-28 DIAGNOSIS — E038 Other specified hypothyroidism: Secondary | ICD-10-CM

## 2015-03-28 DIAGNOSIS — I679 Cerebrovascular disease, unspecified: Secondary | ICD-10-CM

## 2015-03-28 DIAGNOSIS — Z23 Encounter for immunization: Secondary | ICD-10-CM

## 2015-03-28 DIAGNOSIS — I10 Essential (primary) hypertension: Secondary | ICD-10-CM

## 2015-03-28 DIAGNOSIS — E7800 Pure hypercholesterolemia, unspecified: Secondary | ICD-10-CM

## 2015-03-28 DIAGNOSIS — F39 Unspecified mood [affective] disorder: Secondary | ICD-10-CM

## 2015-03-28 DIAGNOSIS — H16132 Photokeratitis, left eye: Secondary | ICD-10-CM

## 2015-03-28 DIAGNOSIS — N182 Chronic kidney disease, stage 2 (mild): Secondary | ICD-10-CM

## 2015-03-28 LAB — LIPID PANEL
CHOL/HDL RATIO: 3
Cholesterol: 179 mg/dL (ref 0–200)
HDL: 61.2 mg/dL (ref >39.00)
LDL CALC: 81 mg/dL (ref 0–99)
NONHDL: 117.8
TRIGLYCERIDES: 185 mg/dL — AB (ref 0.0–149.0)
VLDL: 37 mg/dL (ref 0.0–40.0)

## 2015-03-28 LAB — BASIC METABOLIC PANEL
BUN: 21 mg/dL (ref 6–23)
CALCIUM: 10.6 mg/dL — AB (ref 8.4–10.5)
CHLORIDE: 104 meq/L (ref 96–112)
CO2: 31 mEq/L (ref 19–32)
Creatinine, Ser: 1.63 mg/dL — ABNORMAL HIGH (ref 0.40–1.20)
GFR: 31.75 mL/min — AB (ref >60.00)
GLUCOSE: 89 mg/dL (ref 70–99)
Potassium: 4.2 mEq/L (ref 3.5–5.1)
SODIUM: 141 meq/L (ref 135–145)

## 2015-03-28 LAB — TSH: TSH: 2.16 u[IU]/mL (ref 0.35–4.50)

## 2015-03-28 NOTE — Progress Notes (Addendum)
HPI:  HTN/CKD/hx CVA: -meds: atenolol-chlorthalidone 50-25 from prior PCP, lisinopril 20mg , norvasc added 09/2012, statin -doing well -denies: CP, SOB, swelling, HA  HLD: -on statin, doing well, not fasting -occ leg cramps -no cog impairment on this  Hypothyroid: -stable -denies: hot/cold intol, weight changes  Seasonal allergies: -meds: zyrtex, flonase  Depression and Anxiety: -meds:  Luvox, nortriptyline -sees Dr. Gwenlyn Fudge  Urinary incontinence: -meds: Elmarie Mainland -sees urology  GERD: -meds: pepcid  Hx Breast ca -sees oncology -meds: arimidex  AK: -L forearm -she picks at it    ROS: See pertinent positives and negatives per HPI.  Past Medical History  Diagnosis Date  . Hypertension   . CVA (cerebral vascular accident)   . Hyperlipemia   . Chronic kidney disease   . Hx of breast cancer     Left  . Urinary incontinence   . Anxiety and depression   . Hypothyroid   . Hyperlipidemia   . Allergic rhinitis   . Peripheral neuropathy   . Anemia 06/17/2012  . Cataract 06/17/2012  . Hemorrhoids 07/03/2011  . Diverticulosis 03/14/2008    Qualifier: Diagnosis of  By: Lenna Gilford MD, Deborra Medina   . Irritable bowel syndrome 09/17/2007    Qualifier: Diagnosis of  By: Rosana Hoes CMA, Tammy    . Lung nodule 07/06/2011  . Glaucoma     Past Surgical History  Procedure Laterality Date  . Vaginal hysterectomy    . Spine surgery    .  fundiplication    . Eye surgery      TO DESOLVE AN AREA IN HER LEFT EYE?  . Breast surgery      left breast mastectomy     No family history on file.  History   Social History  . Marital Status: Widowed    Spouse Name: N/A  . Number of Children: N/A  . Years of Education: N/A   Social History Main Topics  . Smoking status: Former Smoker    Types: Cigarettes    Quit date: 11/23/1962  . Smokeless tobacco: Never Used  . Alcohol Use: Yes     Comment: PT STATES DRINKS A BEER OR WINE OCCASSIONALLY, NOT WEEKLY  . Drug Use: No  .  Sexual Activity: Not on file   Other Topics Concern  . None   Social History Narrative   Work or School: none      Home Situation: lives with daughter and son and law      Spiritual Beliefs: [employer]      Lifestyle: no regular exercise; diet is ok              Current outpatient prescriptions:  .  acetaminophen (TYLENOL) 650 MG CR tablet, Take 650 mg by mouth every 8 (eight) hours as needed.  , Disp: , Rfl:  .  amLODipine (NORVASC) 2.5 MG tablet, Take 1 tablet (2.5 mg total) by mouth daily., Disp: 90 tablet, Rfl: 3 .  anastrozole (ARIMIDEX) 1 MG tablet, TAKE 1 TABLET EVERY DAY, Disp: 90 tablet, Rfl: 0 .  aspirin 325 MG tablet, Take 325 mg by mouth daily.  , Disp: , Rfl:  .  atenolol-chlorthalidone (TENORETIC) 50-25 MG per tablet, Take 1 tablet by mouth daily., Disp: 90 tablet, Rfl: 3 .  cetirizine (ZYRTEC) 10 MG tablet, Take 1 tablet (10 mg total) by mouth daily., Disp: 90 tablet, Rfl: 3 .  famotidine (PEPCID) 20 MG tablet, TAKE 1 TABLET TWICE DAILY AS NEEDED, Disp: 180 tablet, Rfl: 0 .  fesoterodine (TOVIAZ)  4 MG TB24 tablet, Take 1 tablet (4 mg total) by mouth daily., Disp: 90 tablet, Rfl: 3 .  fexofenadine (ALLEGRA) 180 MG tablet, Take 180 mg by mouth daily., Disp: , Rfl:  .  fish oil-omega-3 fatty acids 1000 MG capsule, Take 2 g by mouth daily.  , Disp: , Rfl:  .  fluocinonide cream (LIDEX) 0.05 %, Apply topically 2 (two) times daily., Disp: 90 g, Rfl: 3 .  fluticasone (FLONASE) 50 MCG/ACT nasal spray, Place 2 sprays into both nostrils daily., Disp: 48 g, Rfl: 3 .  fluvoxaMINE (LUVOX) 100 MG tablet, Take as directed by Dr. Abner Greenspan, Disp: , Rfl:  .  latanoprost (XALATAN) 0.005 % ophthalmic solution, Place 1 drop into both eyes at bedtime.  , Disp: , Rfl:  .  levothyroxine (SYNTHROID, LEVOTHROID) 50 MCG tablet, Take 1 tablet (50 mcg total) by mouth daily., Disp: 90 tablet, Rfl: 3 .  lisinopril (PRINIVIL,ZESTRIL) 20 MG tablet, Take 1 tablet (20 mg total) by mouth daily.,  Disp: 90 tablet, Rfl: 3 .  mirabegron ER (MYRBETRIQ) 50 MG TB24 tablet, Take 50 mg by mouth every other day., Disp: , Rfl:  .  Multiple Vitamins-Minerals (WOMENS MULTIVITAMIN PLUS PO), Take 1 tablet by mouth daily.  , Disp: , Rfl:  .  NON FORMULARY, Post Surgical Bras, Disp: , Rfl:  .  NON FORMULARY, Non-Silicone Breast Prosthesis, Disp: , Rfl:  .  NON FORMULARY, Silicone Breast Prosthesis, Disp: , Rfl:  .  nortriptyline (PAMELOR) 10 MG capsule, Take 1 capsule (10 mg total) by mouth at bedtime., Disp: 90 capsule, Rfl: 3 .  potassium chloride SA (K-DUR,KLOR-CON) 20 MEQ tablet, Take 1 tablet (20 mEq total) by mouth daily., Disp: 90 tablet, Rfl: 3 .  simvastatin (ZOCOR) 20 MG tablet, Take 1 tablet (20 mg total) by mouth at bedtime., Disp: 90 tablet, Rfl: 3 .  timolol (BETIMOL) 0.5 % ophthalmic solution, Place 1 drop into both eyes 2 (two) times daily., Disp: , Rfl:  .  tobramycin (TOBREX) 0.3 % ophthalmic ointment, Place 1 application into the left eye as needed., Disp: , Rfl:  .  UNABLE TO FIND, 174.9 Left mastectomy   L8030-Silicone Breast Prosthesis-1, Disp: 1 each, Rfl: 0  EXAM:  Filed Vitals:   03/28/15 0847  BP: 130/80  Pulse: 77  Temp: 98.3 F (36.8 C)    Body mass index is 24.35 kg/(m^2).  GENERAL: vitals reviewed and listed above, alert, oriented, appears well hydrated and in no acute distress  HEENT: atraumatic, conjunttiva clear, no obvious abnormalities on inspection of external nose and ears  NECK: no obvious masses on inspection  LUNGS: clear to auscultation bilaterally, no wheezes, rales or rhonchi, good air movement  CV: HRRR, no peripheral edema  SKIN: AK L dorsal foream  MS: moves all extremities without noticeable abnormality  PSYCH: pleasant and cooperative, no obvious depression or anxiety  ASSESSMENT AND PLAN:  Discussed the following assessment and plan:  Essential hypertension - Plan: Basic metabolic panel -stable, cont current  tx  HYPERCHOLESTEROLEMIA - Plan: Lipid Panel -fasting lipids today, lifestyle recs  Other specified hypothyroidism - Plan: TSH -check tSH  Cerebrovascular disease -manage HTN, cholesterol, BS, cont current tx  Chronic kidney disease (CKD), stage II (mild) -reeval today  Mood disorder -cont care with psychiatry  Actinic keratitis, left -she opted for LN2 after discussion risks/benefits -tolerated freeze thaw x3 -wound care and follow up recs provided  -Patient advised to return or notify a doctor immediately if symptoms worsen or persist  or new concerns arise.  Patient Instructions  BEFORE YOU LEAVE: -labs -shingles vaccine -schedule follow up in 3 months  -We have ordered labs or studies at this visit. It can take up to 1-2 weeks for results and processing. We will contact you with instructions IF your results are abnormal. Normal results will be released to your [facility]. If you have not heard from [place] or can not find your results in [facility] in 2 weeks please contact our office.  We recommend the following healthy lifestyle measures: - eat a healthy diet consisting of lots of vegetables, fruits, beans, nuts, seeds, healthy meats such as [place] chicken and fish and whole grains.  - avoid fried foods, fast food, processed foods, sodas, red meet and other fattening foods.  - get a least 150 minutes of aerobic exercise per week.            Colin Benton R.

## 2015-03-28 NOTE — Patient Instructions (Addendum)
BEFORE YOU LEAVE: -labs -shingles vaccine -schedule follow up in 3 months  -We have ordered labs or studies at this visit. It can take up to 1-2 weeks for results and processing. We will contact you with instructions IF your results are abnormal. Normal results will be released to your [facility]. If you have not heard from [place] or can not find your results in [facility] in 2 weeks please contact our office.  We recommend the following healthy lifestyle measures: - eat a healthy diet consisting of lots of vegetables, fruits, beans, nuts, seeds, healthy meats such as [place] chicken and fish and whole grains.  - avoid fried foods, fast food, processed foods, sodas, red meet and other fattening foods.  - get a least 150 minutes of aerobic exercise per week.

## 2015-03-28 NOTE — Progress Notes (Signed)
Pre visit review using our clinic review tool, if applicable. No additional management support is needed unless otherwise documented below in the visit note. 

## 2015-03-28 NOTE — Addendum Note (Signed)
Addended by: Agnes Lawrence on: 03/28/2015 09:30 AM   Modules accepted: Orders

## 2015-04-01 ENCOUNTER — Other Ambulatory Visit: Payer: Self-pay | Admitting: *Deleted

## 2015-04-01 DIAGNOSIS — R899 Unspecified abnormal finding in specimens from other organs, systems and tissues: Secondary | ICD-10-CM

## 2015-04-08 ENCOUNTER — Ambulatory Visit (INDEPENDENT_AMBULATORY_CARE_PROVIDER_SITE_OTHER): Payer: Commercial Managed Care - HMO | Admitting: Family Medicine

## 2015-04-08 ENCOUNTER — Encounter: Payer: Self-pay | Admitting: Family Medicine

## 2015-04-08 VITALS — BP 120/72 | HR 69 | Temp 97.4°F | Ht 58.75 in | Wt 121.1 lb

## 2015-04-08 DIAGNOSIS — R300 Dysuria: Secondary | ICD-10-CM

## 2015-04-08 LAB — POCT URINALYSIS DIPSTICK
BILIRUBIN UA: NEGATIVE
Glucose, UA: NEGATIVE
Ketones, UA: NEGATIVE
Nitrite, UA: POSITIVE
Protein, UA: NEGATIVE
SPEC GRAV UA: 1.01
UROBILINOGEN UA: 0.2
pH, UA: 5.5

## 2015-04-08 MED ORDER — CEPHALEXIN 500 MG PO CAPS: 500.0000 mg | ORAL_CAPSULE | Freq: Two times a day (BID) | ORAL | Status: DC

## 2015-04-08 NOTE — Progress Notes (Signed)
HPI:  Acute visit for:  Dysuria: -sees urologist for chronic urinary incontinence -started 2 days ago -symptoms: dysuria, frequency, hematuria - mild -denies: fevers, nausea, vomiting, abd pain, chills, flank pain, malaise, consitpation, diarrhea, vaginal symptoms  ROS: See pertinent positives and negatives per HPI.  Past Medical History  Diagnosis Date  . Hypertension   . CVA (cerebral vascular accident)   . Hyperlipemia   . Chronic kidney disease   . Hx of breast cancer     Left  . Urinary incontinence   . Anxiety and depression   . Hypothyroid   . Hyperlipidemia   . Allergic rhinitis   . Peripheral neuropathy   . Anemia 06/17/2012  . Cataract 06/17/2012  . Hemorrhoids 07/03/2011  . Diverticulosis 03/14/2008    Qualifier: Diagnosis of  By: Lenna Gilford MD, Deborra Medina   . Irritable bowel syndrome 09/17/2007    Qualifier: Diagnosis of  By: Rosana Hoes CMA, Tammy    . Lung nodule 07/06/2011  . Glaucoma     Past Surgical History  Procedure Laterality Date  . Vaginal hysterectomy    . Spine surgery    .  fundiplication    . Eye surgery      TO DESOLVE AN AREA IN HER LEFT EYE?  . Breast surgery      left breast mastectomy     No family history on file.  History   Social History  . Marital Status: Widowed    Spouse Name: N/A  . Number of Children: N/A  . Years of Education: N/A   Social History Main Topics  . Smoking status: Former Smoker    Types: Cigarettes    Quit date: 11/23/1962  . Smokeless tobacco: Never Used  . Alcohol Use: Yes     Comment: PT STATES DRINKS A BEER OR WINE OCCASSIONALLY, NOT WEEKLY  . Drug Use: No  . Sexual Activity: Not on file   Other Topics Concern  . None   Social History Narrative   Work or School: none      Home Situation: lives with daughter and son and law      Spiritual Beliefs: [employer]      Lifestyle: no regular exercise; diet is ok              Current outpatient prescriptions:  .  acetaminophen (TYLENOL) 650 MG  CR tablet, Take 650 mg by mouth every 8 (eight) hours as needed.  , Disp: , Rfl:  .  amLODipine (NORVASC) 2.5 MG tablet, Take 1 tablet (2.5 mg total) by mouth daily., Disp: 90 tablet, Rfl: 3 .  anastrozole (ARIMIDEX) 1 MG tablet, TAKE 1 TABLET EVERY DAY, Disp: 90 tablet, Rfl: 0 .  aspirin 325 MG tablet, Take 325 mg by mouth daily.  , Disp: , Rfl:  .  atenolol-chlorthalidone (TENORETIC) 50-25 MG per tablet, Take 1 tablet by mouth daily., Disp: 90 tablet, Rfl: 3 .  cetirizine (ZYRTEC) 10 MG tablet, Take 1 tablet (10 mg total) by mouth daily., Disp: 90 tablet, Rfl: 3 .  famotidine (PEPCID) 20 MG tablet, TAKE 1 TABLET TWICE DAILY AS NEEDED, Disp: 180 tablet, Rfl: 0 .  fesoterodine (TOVIAZ) 4 MG TB24 tablet, Take 1 tablet (4 mg total) by mouth daily., Disp: 90 tablet, Rfl: 3 .  fexofenadine (ALLEGRA) 180 MG tablet, Take 180 mg by mouth daily., Disp: , Rfl:  .  fish oil-omega-3 fatty acids 1000 MG capsule, Take 2 g by mouth daily.  , Disp: , Rfl:  .  fluocinonide cream (LIDEX) 0.05 %, Apply topically 2 (two) times daily., Disp: 90 g, Rfl: 3 .  fluticasone (FLONASE) 50 MCG/ACT nasal spray, Place 2 sprays into both nostrils daily., Disp: 48 g, Rfl: 3 .  fluvoxaMINE (LUVOX) 100 MG tablet, Take as directed by Dr. Abner Greenspan, Disp: , Rfl:  .  latanoprost (XALATAN) 0.005 % ophthalmic solution, Place 1 drop into both eyes at bedtime.  , Disp: , Rfl:  .  levothyroxine (SYNTHROID, LEVOTHROID) 50 MCG tablet, Take 1 tablet (50 mcg total) by mouth daily., Disp: 90 tablet, Rfl: 3 .  lisinopril (PRINIVIL,ZESTRIL) 20 MG tablet, Take 1 tablet (20 mg total) by mouth daily., Disp: 90 tablet, Rfl: 3 .  mirabegron ER (MYRBETRIQ) 50 MG TB24 tablet, Take 50 mg by mouth every other day., Disp: , Rfl:  .  Multiple Vitamins-Minerals (WOMENS MULTIVITAMIN PLUS PO), Take 1 tablet by mouth daily.  , Disp: , Rfl:  .  NON FORMULARY, Post Surgical Bras, Disp: , Rfl:  .  NON FORMULARY, Non-Silicone Breast Prosthesis, Disp: , Rfl:  .  NON  FORMULARY, Silicone Breast Prosthesis, Disp: , Rfl:  .  nortriptyline (PAMELOR) 10 MG capsule, Take 1 capsule (10 mg total) by mouth at bedtime., Disp: 90 capsule, Rfl: 3 .  potassium chloride SA (K-DUR,KLOR-CON) 20 MEQ tablet, Take 1 tablet (20 mEq total) by mouth daily., Disp: 90 tablet, Rfl: 3 .  simvastatin (ZOCOR) 20 MG tablet, Take 1 tablet (20 mg total) by mouth at bedtime., Disp: 90 tablet, Rfl: 3 .  timolol (BETIMOL) 0.5 % ophthalmic solution, Place 1 drop into both eyes 2 (two) times daily., Disp: , Rfl:  .  tobramycin (TOBREX) 0.3 % ophthalmic ointment, Place 1 application into the left eye as needed., Disp: , Rfl:  .  UNABLE TO FIND, 174.9 Left mastectomy   L8030-Silicone Breast Prosthesis-1, Disp: 1 each, Rfl: 0 .  cephALEXin (KEFLEX) 500 MG capsule, Take 1 capsule (500 mg total) by mouth 2 (two) times daily., Disp: 10 capsule, Rfl: 0  EXAM:  Filed Vitals:   04/08/15 1401  BP: 120/72  Pulse: 69  Temp: 97.4 F (36.3 C)    Body mass index is 24.68 kg/(m^2).  GENERAL: vitals reviewed and listed above, alert, oriented, appears well hydrated and in no acute distress  HEENT: atraumatic, conjunttiva clear, no obvious abnormalities on inspection of external nose and ears  NECK: no obvious masses on inspection  LUNGS: clear to auscultation bilaterally, no wheezes, rales or rhonchi, good air movement  CV: HRRR, no peripheral edema  ABD: soft, NTTP, no cva ttp  MS: moves all extremities without noticeable abnormality  PSYCH: pleasant and cooperative, no obvious depression or anxiety  ASSESSMENT AND PLAN:  Discussed the following assessment and plan:  Dysuria - Plan: POC Urinalysis Dipstick  -udip c/w uti, start abx, cx pending -Patient advised to return or notify a doctor immediately if symptoms worsen or persist or new concerns arise.  Patient Instructions  Take the antibiotic as instructed and follow up as needed if symptoms persist or worsens     KIM, HANNAH  R.

## 2015-04-08 NOTE — Patient Instructions (Signed)
Take the antibiotic as instructed and follow up as needed if symptoms persist or worsens

## 2015-04-08 NOTE — Addendum Note (Signed)
Addended by: Agnes Lawrence on: 04/08/2015 05:15 PM   Modules accepted: Orders

## 2015-04-08 NOTE — Progress Notes (Signed)
Pre visit review using our clinic review tool, if applicable. No additional management support is needed unless otherwise documented below in the visit note. 

## 2015-04-11 LAB — URINE CULTURE: Colony Count: >=100000

## 2015-04-16 ENCOUNTER — Other Ambulatory Visit (INDEPENDENT_AMBULATORY_CARE_PROVIDER_SITE_OTHER): Payer: Commercial Managed Care - HMO

## 2015-04-16 DIAGNOSIS — R899 Unspecified abnormal finding in specimens from other organs, systems and tissues: Secondary | ICD-10-CM | POA: Diagnosis not present

## 2015-04-16 LAB — BASIC METABOLIC PANEL
BUN: 20 mg/dL (ref 6–23)
CALCIUM: 10.2 mg/dL (ref 8.4–10.5)
CO2: 29 meq/L (ref 19–32)
Chloride: 101 mEq/L (ref 96–112)
Creatinine, Ser: 1.27 mg/dL — ABNORMAL HIGH (ref 0.40–1.20)
GFR: 42.34 mL/min — ABNORMAL LOW (ref >60.00)
Glucose, Bld: 107 mg/dL — ABNORMAL HIGH (ref 70–99)
Potassium: 3.9 mEq/L (ref 3.5–5.1)
Sodium: 139 mEq/L (ref 135–145)

## 2015-04-16 LAB — VITAMIN D 25 HYDROXY (VIT D DEFICIENCY, FRACTURES): VITD: 49.69 ng/mL (ref 30.00–100.00)

## 2015-04-17 LAB — PTH, INTACT AND CALCIUM
CALCIUM: 10 mg/dL (ref 8.4–10.5)
PTH: 30 pg/mL (ref 14–64)

## 2015-04-25 ENCOUNTER — Encounter: Payer: Self-pay | Admitting: Family Medicine

## 2015-04-25 ENCOUNTER — Ambulatory Visit (INDEPENDENT_AMBULATORY_CARE_PROVIDER_SITE_OTHER): Payer: Commercial Managed Care - HMO | Admitting: Family Medicine

## 2015-04-25 ENCOUNTER — Telehealth: Payer: Self-pay | Admitting: Family Medicine

## 2015-04-25 VITALS — BP 116/72 | HR 74 | Temp 97.9°F | Ht 58.75 in | Wt 119.4 lb

## 2015-04-25 DIAGNOSIS — R300 Dysuria: Secondary | ICD-10-CM | POA: Diagnosis not present

## 2015-04-25 LAB — POCT URINALYSIS DIPSTICK
Bilirubin, UA: NEGATIVE
Glucose, UA: NEGATIVE
KETONES UA: NEGATIVE
NITRITE UA: POSITIVE
PH UA: 6.5
SPEC GRAV UA: 1.015
UROBILINOGEN UA: 0.2

## 2015-04-25 MED ORDER — CIPROFLOXACIN HCL 250 MG PO TABS: 250.0000 mg | ORAL_TABLET | Freq: Two times a day (BID) | ORAL | Status: DC

## 2015-04-25 NOTE — Telephone Encounter (Signed)
I called the pt and scheduled an appt for today at 3pm per Dr Maudie Mercury.

## 2015-04-25 NOTE — Telephone Encounter (Signed)
Pt states she has another UTI and would like to speak w/ you.

## 2015-04-25 NOTE — Progress Notes (Signed)
HPI:  Acute visit for:  Dysuria: -treated for pan-sensitive E. Landry Mellow UTI with 5 days og keflex on 5/16 -reports: started yesterday, symptoms were completely resolved after treatment for last infection -symptoms: frequency, burning with urination -denies: fevers, vomiting, nausea, flank pain, hematuria, vv pruritis or discharge -sees urologist for chronic urinary incontinence, Dr. Arther Dames - has appt. with him in a few weeks  ROS: See pertinent positives and negatives per HPI.  Past Medical History  Diagnosis Date  . Hypertension   . CVA (cerebral vascular accident)   . Hyperlipemia   . Chronic kidney disease   . Hx of breast cancer     Left  . Urinary incontinence   . Anxiety and depression   . Hypothyroid   . Hyperlipidemia   . Allergic rhinitis   . Peripheral neuropathy   . Anemia 06/17/2012  . Cataract 06/17/2012  . Hemorrhoids 07/03/2011  . Diverticulosis 03/14/2008    Qualifier: Diagnosis of  By: Lenna Gilford MD, Deborra Medina   . Irritable bowel syndrome 09/17/2007    Qualifier: Diagnosis of  By: Rosana Hoes CMA, Tammy    . Lung nodule 07/06/2011  . Glaucoma     Past Surgical History  Procedure Laterality Date  . Vaginal hysterectomy    . Spine surgery    .  fundiplication    . Eye surgery      TO DESOLVE AN AREA IN HER LEFT EYE?  . Breast surgery      left breast mastectomy     No family history on file.  History   Social History  . Marital Status: Widowed    Spouse Name: N/A  . Number of Children: N/A  . Years of Education: N/A   Social History Main Topics  . Smoking status: Former Smoker    Types: Cigarettes    Quit date: 11/23/1962  . Smokeless tobacco: Never Used  . Alcohol Use: Yes     Comment: PT STATES DRINKS A BEER OR WINE OCCASSIONALLY, NOT WEEKLY  . Drug Use: No  . Sexual Activity: Not on file   Other Topics Concern  . None   Social History Narrative   Work or School: none      Home Situation: lives with daughter and son and law      Spiritual  Beliefs: [employer]      Lifestyle: no regular exercise; diet is ok              Current outpatient prescriptions:  .  acetaminophen (TYLENOL) 650 MG CR tablet, Take 650 mg by mouth every 8 (eight) hours as needed.  , Disp: , Rfl:  .  amLODipine (NORVASC) 2.5 MG tablet, Take 1 tablet (2.5 mg total) by mouth daily., Disp: 90 tablet, Rfl: 3 .  anastrozole (ARIMIDEX) 1 MG tablet, TAKE 1 TABLET EVERY DAY, Disp: 90 tablet, Rfl: 0 .  aspirin 325 MG tablet, Take 325 mg by mouth daily.  , Disp: , Rfl:  .  atenolol-chlorthalidone (TENORETIC) 50-25 MG per tablet, Take 1 tablet by mouth daily., Disp: 90 tablet, Rfl: 3 .  cetirizine (ZYRTEC) 10 MG tablet, Take 1 tablet (10 mg total) by mouth daily., Disp: 90 tablet, Rfl: 3 .  famotidine (PEPCID) 20 MG tablet, TAKE 1 TABLET TWICE DAILY AS NEEDED, Disp: 180 tablet, Rfl: 0 .  fesoterodine (TOVIAZ) 4 MG TB24 tablet, Take 1 tablet (4 mg total) by mouth daily., Disp: 90 tablet, Rfl: 3 .  fexofenadine (ALLEGRA) 180 MG tablet, Take 180 mg by  mouth daily., Disp: , Rfl:  .  fish oil-omega-3 fatty acids 1000 MG capsule, Take 2 g by mouth daily.  , Disp: , Rfl:  .  fluocinonide cream (LIDEX) 0.05 %, Apply topically 2 (two) times daily., Disp: 90 g, Rfl: 3 .  fluticasone (FLONASE) 50 MCG/ACT nasal spray, Place 2 sprays into both nostrils daily., Disp: 48 g, Rfl: 3 .  fluvoxaMINE (LUVOX) 100 MG tablet, Take as directed by Dr. Abner Greenspan, Disp: , Rfl:  .  latanoprost (XALATAN) 0.005 % ophthalmic solution, Place 1 drop into both eyes at bedtime.  , Disp: , Rfl:  .  levothyroxine (SYNTHROID, LEVOTHROID) 50 MCG tablet, Take 1 tablet (50 mcg total) by mouth daily., Disp: 90 tablet, Rfl: 3 .  lisinopril (PRINIVIL,ZESTRIL) 20 MG tablet, Take 1 tablet (20 mg total) by mouth daily., Disp: 90 tablet, Rfl: 3 .  mirabegron ER (MYRBETRIQ) 50 MG TB24 tablet, Take 50 mg by mouth every other day., Disp: , Rfl:  .  Multiple Vitamins-Minerals (WOMENS MULTIVITAMIN PLUS PO), Take 1  tablet by mouth daily.  , Disp: , Rfl:  .  NON FORMULARY, Post Surgical Bras, Disp: , Rfl:  .  NON FORMULARY, Non-Silicone Breast Prosthesis, Disp: , Rfl:  .  NON FORMULARY, Silicone Breast Prosthesis, Disp: , Rfl:  .  nortriptyline (PAMELOR) 10 MG capsule, Take 1 capsule (10 mg total) by mouth at bedtime., Disp: 90 capsule, Rfl: 3 .  potassium chloride SA (K-DUR,KLOR-CON) 20 MEQ tablet, Take 1 tablet (20 mEq total) by mouth daily., Disp: 90 tablet, Rfl: 3 .  simvastatin (ZOCOR) 20 MG tablet, Take 1 tablet (20 mg total) by mouth at bedtime., Disp: 90 tablet, Rfl: 3 .  timolol (BETIMOL) 0.5 % ophthalmic solution, Place 1 drop into both eyes 2 (two) times daily., Disp: , Rfl:  .  tobramycin (TOBREX) 0.3 % ophthalmic ointment, Place 1 application into the left eye as needed., Disp: , Rfl:  .  UNABLE TO FIND, 174.9 Left mastectomy   L8030-Silicone Breast Prosthesis-1, Disp: 1 each, Rfl: 0  EXAM:  Filed Vitals:   04/25/15 1449  BP: 116/72  Pulse: 74  Temp: 97.9 F (36.6 C)    Body mass index is 24.33 kg/(m^2).  GENERAL: vitals reviewed and listed above, alert, oriented, appears well hydrated and in no acute distress  HEENT: atraumatic, conjunttiva clear, no obvious abnormalities on inspection of external nose and ears  NECK: no obvious masses on inspection  LUNGS: clear to auscultation bilaterally, no wheezes, rales or rhonchi, good air movement  CV: HRRR, no peripheral edema  ABD: BS+, soft, NTTP, no CVA TTP  MS: moves all extremities without noticeable abnormality  PSYCH: pleasant and cooperative, no obvious depression or anxiety  ASSESSMENT AND PLAN:  Discussed the following assessment and plan:  Dysuria - Plan: POC Urinalysis Dipstick  -discussed prevention measures -check udip with infection, tx with different abx renally dosed - risks discussed, f/u with urologist as scheduled -Patient advised to return or notify a doctor immediately if symptoms worsen or persist or  new concerns arise.  There are no Patient Instructions on file for this visit.   Colin Benton R.

## 2015-04-25 NOTE — Progress Notes (Signed)
Pre visit review using our clinic review tool, if applicable. No additional management support is needed unless otherwise documented below in the visit note. 

## 2015-06-26 ENCOUNTER — Ambulatory Visit (INDEPENDENT_AMBULATORY_CARE_PROVIDER_SITE_OTHER): Payer: Commercial Managed Care - HMO | Admitting: Family Medicine

## 2015-06-26 ENCOUNTER — Encounter: Payer: Self-pay | Admitting: Family Medicine

## 2015-06-26 VITALS — BP 126/70 | HR 80 | Temp 98.6°F | Ht 58.75 in | Wt 117.6 lb

## 2015-06-26 DIAGNOSIS — E038 Other specified hypothyroidism: Secondary | ICD-10-CM | POA: Diagnosis not present

## 2015-06-26 DIAGNOSIS — I10 Essential (primary) hypertension: Secondary | ICD-10-CM

## 2015-06-26 DIAGNOSIS — N182 Chronic kidney disease, stage 2 (mild): Secondary | ICD-10-CM

## 2015-06-26 DIAGNOSIS — E7800 Pure hypercholesterolemia, unspecified: Secondary | ICD-10-CM

## 2015-06-26 NOTE — Progress Notes (Signed)
Pre visit review using our clinic review tool, if applicable. No additional management support is needed unless otherwise documented below in the visit note. 

## 2015-06-26 NOTE — Progress Notes (Signed)
HPI:  HTN/CKD/hx CVA: -meds: atenolol-chlorthalidone 50-25 from prior PCP, lisinopril 20mg , norvasc 2.5 added 09/2012, statin -doing well -denies: CP, SOB, swelling, HA  HLD: -on statin, doing well -occ leg cramps -no cog impairment on this  Hypothyroid: -stable -denies: hot/cold intol, weight changes  Seasonal allergies: -meds: zyrtex, flonase  Depression and Anxiety: -meds: Luvox, nortriptyline -sees Dr. Gwenlyn Fudge  Urinary incontinence: -meds: Elmarie Mainland -sees urology  GERD: -meds: pepcid  Hx Breast ca -sees oncology -meds: arimidex   ROS: See pertinent positives and negatives per HPI.  Past Medical History  Diagnosis Date  . Hypertension   . CVA (cerebral vascular accident)   . Hyperlipemia   . Chronic kidney disease   . Hx of breast cancer     Left  . Urinary incontinence   . Anxiety and depression   . Hypothyroid   . Hyperlipidemia   . Allergic rhinitis   . Peripheral neuropathy   . Anemia 06/17/2012  . Cataract 06/17/2012  . Hemorrhoids 07/03/2011  . Diverticulosis 03/14/2008    Qualifier: Diagnosis of  By: Lenna Gilford MD, Deborra Medina   . Irritable bowel syndrome 09/17/2007    Qualifier: Diagnosis of  By: Rosana Hoes CMA, Tammy    . Lung nodule 07/06/2011  . Glaucoma     Past Surgical History  Procedure Laterality Date  . Vaginal hysterectomy    . Spine surgery    .  fundiplication    . Eye surgery      TO DESOLVE AN AREA IN HER LEFT EYE?  . Breast surgery      left breast mastectomy     No family history on file.  History   Social History  . Marital Status: Widowed    Spouse Name: N/A  . Number of Children: N/A  . Years of Education: N/A   Social History Main Topics  . Smoking status: Former Smoker    Types: Cigarettes    Quit date: 11/23/1962  . Smokeless tobacco: Never Used  . Alcohol Use: Yes     Comment: PT STATES DRINKS A BEER OR WINE OCCASSIONALLY, NOT WEEKLY  . Drug Use: No  . Sexual Activity: Not on file   Other Topics  Concern  . None   Social History Narrative   Work or School: none      Home Situation: lives with daughter and son and law      Spiritual Beliefs: [employer]      Lifestyle: no regular exercise; diet is ok              Current outpatient prescriptions:  .  acetaminophen (TYLENOL) 650 MG CR tablet, Take 650 mg by mouth every 8 (eight) hours as needed.  , Disp: , Rfl:  .  amLODipine (NORVASC) 2.5 MG tablet, Take 1 tablet (2.5 mg total) by mouth daily., Disp: 90 tablet, Rfl: 3 .  anastrozole (ARIMIDEX) 1 MG tablet, TAKE 1 TABLET EVERY DAY, Disp: 90 tablet, Rfl: 0 .  aspirin 325 MG tablet, Take 325 mg by mouth daily.  , Disp: , Rfl:  .  atenolol-chlorthalidone (TENORETIC) 50-25 MG per tablet, Take 1 tablet by mouth daily., Disp: 90 tablet, Rfl: 3 .  cetirizine (ZYRTEC) 10 MG tablet, Take 1 tablet (10 mg total) by mouth daily., Disp: 90 tablet, Rfl: 3 .  ciprofloxacin (CIPRO) 250 MG tablet, Take 1 tablet (250 mg total) by mouth 2 (two) times daily., Disp: 10 tablet, Rfl: 0 .  famotidine (PEPCID) 20 MG tablet, TAKE 1 TABLET  TWICE DAILY AS NEEDED, Disp: 180 tablet, Rfl: 0 .  fesoterodine (TOVIAZ) 4 MG TB24 tablet, Take 1 tablet (4 mg total) by mouth daily., Disp: 90 tablet, Rfl: 3 .  fexofenadine (ALLEGRA) 180 MG tablet, Take 180 mg by mouth daily., Disp: , Rfl:  .  fish oil-omega-3 fatty acids 1000 MG capsule, Take 2 g by mouth daily.  , Disp: , Rfl:  .  fluocinonide cream (LIDEX) 0.05 %, Apply topically 2 (two) times daily., Disp: 90 g, Rfl: 3 .  fluticasone (FLONASE) 50 MCG/ACT nasal spray, Place 2 sprays into both nostrils daily., Disp: 48 g, Rfl: 3 .  fluvoxaMINE (LUVOX) 100 MG tablet, Take as directed by Dr. Abner Greenspan, Disp: , Rfl:  .  latanoprost (XALATAN) 0.005 % ophthalmic solution, Place 1 drop into both eyes at bedtime.  , Disp: , Rfl:  .  levothyroxine (SYNTHROID, LEVOTHROID) 50 MCG tablet, Take 1 tablet (50 mcg total) by mouth daily., Disp: 90 tablet, Rfl: 3 .  lisinopril  (PRINIVIL,ZESTRIL) 20 MG tablet, Take 1 tablet (20 mg total) by mouth daily., Disp: 90 tablet, Rfl: 3 .  mirabegron ER (MYRBETRIQ) 50 MG TB24 tablet, Take 50 mg by mouth every other day., Disp: , Rfl:  .  Multiple Vitamins-Minerals (WOMENS MULTIVITAMIN PLUS PO), Take 1 tablet by mouth daily.  , Disp: , Rfl:  .  NON FORMULARY, Post Surgical Bras, Disp: , Rfl:  .  NON FORMULARY, Non-Silicone Breast Prosthesis, Disp: , Rfl:  .  NON FORMULARY, Silicone Breast Prosthesis, Disp: , Rfl:  .  nortriptyline (PAMELOR) 10 MG capsule, Take 1 capsule (10 mg total) by mouth at bedtime., Disp: 90 capsule, Rfl: 3 .  potassium chloride SA (K-DUR,KLOR-CON) 20 MEQ tablet, Take 1 tablet (20 mEq total) by mouth daily., Disp: 90 tablet, Rfl: 3 .  simvastatin (ZOCOR) 20 MG tablet, Take 1 tablet (20 mg total) by mouth at bedtime., Disp: 90 tablet, Rfl: 3 .  timolol (BETIMOL) 0.5 % ophthalmic solution, Place 1 drop into both eyes 2 (two) times daily., Disp: , Rfl:  .  tobramycin (TOBREX) 0.3 % ophthalmic ointment, Place 1 application into the left eye as needed., Disp: , Rfl:  .  UNABLE TO FIND, 174.9 Left mastectomy   L8030-Silicone Breast Prosthesis-1, Disp: 1 each, Rfl: 0  EXAM:  Filed Vitals:   06/26/15 0843  BP: 126/70  Pulse: 80  Temp: 98.6 F (37 C)    Body mass index is 23.96 kg/(m^2).  GENERAL: vitals reviewed and listed above, alert, oriented, appears well hydrated and in no acute distress  HEENT: atraumatic, conjunttiva clear, no obvious abnormalities on inspection of external nose and ears  NECK: no obvious masses on inspection  LUNGS: clear to auscultation bilaterally, no wheezes, rales or rhonchi, good air movement  CV: HRRR, I/VI SEM, no peripheral edema  MS: moves all extremities without noticeable abnormality  PSYCH: pleasant and cooperative, no obvious depression or anxiety  ASSESSMENT AND PLAN:  Discussed the following assessment and plan:  Chronic kidney disease (CKD), stage II  (mild)  Essential hypertension  HYPERCHOLESTEROLEMIA  Other specified hypothyroidism  -BP much improved - cont current tx -follow up in 3 months - plan to do labs and flu shot then, but advised flu vaccine may come out earlier -Patient advised to return or notify a doctor immediately if symptoms worsen or persist or new concerns arise.  There are no Patient Instructions on file for this visit.   Alison Benton R.

## 2015-07-01 ENCOUNTER — Other Ambulatory Visit: Payer: Self-pay | Admitting: Pulmonary Disease

## 2015-07-03 ENCOUNTER — Other Ambulatory Visit: Payer: Self-pay | Admitting: Pulmonary Disease

## 2015-07-05 ENCOUNTER — Telehealth: Payer: Self-pay | Admitting: Family Medicine

## 2015-07-05 NOTE — Telephone Encounter (Signed)
Refill request for Famotidine 20 mg and a 90 day supply to [facility].

## 2015-07-05 NOTE — Telephone Encounter (Signed)
Script was sent

## 2015-07-08 ENCOUNTER — Other Ambulatory Visit: Payer: Self-pay | Admitting: Family Medicine

## 2015-08-19 ENCOUNTER — Encounter: Payer: Self-pay | Admitting: Family Medicine

## 2015-08-19 ENCOUNTER — Ambulatory Visit (INDEPENDENT_AMBULATORY_CARE_PROVIDER_SITE_OTHER): Payer: Commercial Managed Care - HMO | Admitting: Family Medicine

## 2015-08-19 VITALS — BP 130/60 | HR 88 | Temp 97.3°F | Ht 58.75 in | Wt 119.5 lb

## 2015-08-19 DIAGNOSIS — R300 Dysuria: Secondary | ICD-10-CM

## 2015-08-19 DIAGNOSIS — Z23 Encounter for immunization: Secondary | ICD-10-CM

## 2015-08-19 LAB — POCT URINALYSIS DIPSTICK
Bilirubin, UA: NEGATIVE
Blood, UA: POSITIVE
Glucose, UA: NEGATIVE
Ketones, UA: NEGATIVE
Nitrite, UA: NEGATIVE
PROTEIN UA: POSITIVE
Spec Grav, UA: 1.02
UROBILINOGEN UA: 0.2
pH, UA: 6

## 2015-08-19 MED ORDER — CEPHALEXIN 500 MG PO CAPS: 500.0000 mg | ORAL_CAPSULE | Freq: Two times a day (BID) | ORAL | Status: DC

## 2015-08-19 NOTE — Addendum Note (Signed)
Addended by: Agnes Lawrence on: 08/19/2015 01:29 PM   Modules accepted: Orders

## 2015-08-19 NOTE — Addendum Note (Signed)
Addended by: Agnes Lawrence on: 08/19/2015 01:16 PM   Modules accepted: Orders

## 2015-08-19 NOTE — Progress Notes (Signed)
Pre visit review using our clinic review tool, if applicable. No additional management support is needed unless otherwise documented below in the visit note. 

## 2015-08-19 NOTE — Progress Notes (Signed)
HPI:   Acute visit for:  Dysuria: -started 3 days ago -symptoms: dysuria, frequency -denies: abd or flank pain, fevers, nausea, vomiting, vaginal symptoms, changes in bowels, malaise -hx of UTI  ROS: See pertinent positives and negatives per HPI.  Past Medical History  Diagnosis Date  . Hypertension   . CVA (cerebral vascular accident)   . Hyperlipemia   . Chronic kidney disease   . Hx of breast cancer     Left  . Urinary incontinence   . Anxiety and depression   . Hypothyroid   . Hyperlipidemia   . Allergic rhinitis   . Peripheral neuropathy   . Anemia 06/17/2012  . Cataract 06/17/2012  . Hemorrhoids 07/03/2011  . Diverticulosis 03/14/2008    Qualifier: Diagnosis of  By: Lenna Gilford MD, Deborra Medina   . Irritable bowel syndrome 09/17/2007    Qualifier: Diagnosis of  By: Rosana Hoes CMA, Tammy    . Lung nodule 07/06/2011  . Glaucoma     Past Surgical History  Procedure Laterality Date  . Vaginal hysterectomy    . Spine surgery    .  fundiplication    . Eye surgery      TO DESOLVE AN AREA IN HER LEFT EYE?  . Breast surgery      left breast mastectomy     No family history on file.  Social History   Social History  . Marital Status: Widowed    Spouse Name: N/A  . Number of Children: N/A  . Years of Education: N/A   Social History Main Topics  . Smoking status: Former Smoker    Types: Cigarettes    Quit date: 11/23/1962  . Smokeless tobacco: Never Used  . Alcohol Use: Yes     Comment: PT STATES DRINKS A BEER OR WINE OCCASSIONALLY, NOT WEEKLY  . Drug Use: No  . Sexual Activity: Not Asked   Other Topics Concern  . None   Social History Narrative   Work or School: none      Home Situation: lives with daughter and son and law      Spiritual Beliefs: [employer]      Lifestyle: no regular exercise; diet is ok              Current outpatient prescriptions:  .  acetaminophen (TYLENOL) 650 MG CR tablet, Take 650 mg by mouth every 8 (eight) hours as needed.   , Disp: , Rfl:  .  amLODipine (NORVASC) 2.5 MG tablet, Take 1 tablet (2.5 mg total) by mouth daily., Disp: 90 tablet, Rfl: 3 .  anastrozole (ARIMIDEX) 1 MG tablet, TAKE 1 TABLET EVERY DAY, Disp: 90 tablet, Rfl: 0 .  aspirin 325 MG tablet, Take 325 mg by mouth daily.  , Disp: , Rfl:  .  atenolol-chlorthalidone (TENORETIC) 50-25 MG per tablet, Take 1 tablet by mouth daily., Disp: 90 tablet, Rfl: 3 .  cetirizine (ZYRTEC) 10 MG tablet, Take 1 tablet (10 mg total) by mouth daily., Disp: 90 tablet, Rfl: 3 .  ciprofloxacin (CIPRO) 250 MG tablet, Take 1 tablet (250 mg total) by mouth 2 (two) times daily., Disp: 10 tablet, Rfl: 0 .  famotidine (PEPCID) 20 MG tablet, TAKE 1 TABLET TWICE DAILY AS NEEDED, Disp: 180 tablet, Rfl: 0 .  fesoterodine (TOVIAZ) 4 MG TB24 tablet, Take 1 tablet (4 mg total) by mouth daily., Disp: 90 tablet, Rfl: 3 .  fexofenadine (ALLEGRA) 180 MG tablet, Take 180 mg by mouth daily., Disp: , Rfl:  .  fish  oil-omega-3 fatty acids 1000 MG capsule, Take 2 g by mouth daily.  , Disp: , Rfl:  .  fluocinonide cream (LIDEX) 0.05 %, Apply topically 2 (two) times daily., Disp: 90 g, Rfl: 3 .  fluticasone (FLONASE) 50 MCG/ACT nasal spray, Place 2 sprays into both nostrils daily., Disp: 48 g, Rfl: 3 .  fluvoxaMINE (LUVOX) 100 MG tablet, Take as directed by Dr. Abner Greenspan, Disp: , Rfl:  .  latanoprost (XALATAN) 0.005 % ophthalmic solution, Place 1 drop into both eyes at bedtime.  , Disp: , Rfl:  .  levothyroxine (SYNTHROID, LEVOTHROID) 50 MCG tablet, Take 1 tablet (50 mcg total) by mouth daily., Disp: 90 tablet, Rfl: 3 .  lisinopril (PRINIVIL,ZESTRIL) 20 MG tablet, Take 1 tablet (20 mg total) by mouth daily., Disp: 90 tablet, Rfl: 3 .  mirabegron ER (MYRBETRIQ) 50 MG TB24 tablet, Take 50 mg by mouth every other day., Disp: , Rfl:  .  Multiple Vitamins-Minerals (WOMENS MULTIVITAMIN PLUS PO), Take 1 tablet by mouth daily.  , Disp: , Rfl:  .  NON FORMULARY, Post Surgical Bras, Disp: , Rfl:  .  NON  FORMULARY, Non-Silicone Breast Prosthesis, Disp: , Rfl:  .  NON FORMULARY, Silicone Breast Prosthesis, Disp: , Rfl:  .  nortriptyline (PAMELOR) 10 MG capsule, Take 1 capsule (10 mg total) by mouth at bedtime., Disp: 90 capsule, Rfl: 3 .  potassium chloride SA (K-DUR,KLOR-CON) 20 MEQ tablet, Take 1 tablet (20 mEq total) by mouth daily., Disp: 90 tablet, Rfl: 3 .  simvastatin (ZOCOR) 20 MG tablet, Take 1 tablet (20 mg total) by mouth at bedtime., Disp: 90 tablet, Rfl: 3 .  timolol (BETIMOL) 0.5 % ophthalmic solution, Place 1 drop into both eyes 2 (two) times daily., Disp: , Rfl:  .  tobramycin (TOBREX) 0.3 % ophthalmic ointment, Place 1 application into the left eye as needed., Disp: , Rfl:  .  UNABLE TO FIND, 174.9 Left mastectomy   L8030-Silicone Breast Prosthesis-1, Disp: 1 each, Rfl: 0  EXAM:  Filed Vitals:   08/19/15 1053  BP: 130/60  Pulse: 88  Temp: 97.3 F (36.3 C)    Body mass index is 24.35 kg/(m^2).  GENERAL: vitals reviewed and listed above, alert, oriented, appears well hydrated and in no acute distress  HEENT: atraumatic, conjunttiva clear, no obvious abnormalities on inspection of external nose and ears  NECK: no obvious masses on inspection  LUNGS: clear to auscultation bilaterally, no wheezes, rales or rhonchi, good air movement  CV: HRRR, no peripheral edema  ABD: no CVA TTP, BS+, NTTP  MS: moves all extremities without noticeable abnormality  PSYCH: pleasant and cooperative, no obvious depression or anxiety  ASSESSMENT AND PLAN:  Discussed the following assessment and plan:  Dysuria  -Patient advised to return or notify a doctor immediately if symptoms worsen or persist or new concerns arise.  Patient Instructions  BEFORE YOU LEAVE: -flu shot -leave urine sample to check UA and culture if abnormal       Alison Cordova, Alison R.

## 2015-08-19 NOTE — Patient Instructions (Signed)
BEFORE YOU LEAVE: -flu shot -leave urine sample to check UA and culture if abnormal

## 2015-08-22 LAB — URINE CULTURE: Colony Count: >=100000

## 2015-09-26 ENCOUNTER — Encounter: Payer: Self-pay | Admitting: Family Medicine

## 2015-09-26 ENCOUNTER — Ambulatory Visit (INDEPENDENT_AMBULATORY_CARE_PROVIDER_SITE_OTHER): Payer: Commercial Managed Care - HMO | Admitting: Family Medicine

## 2015-09-26 VITALS — BP 140/70 | HR 85 | Temp 97.3°F | Ht 58.75 in | Wt 120.4 lb

## 2015-09-26 DIAGNOSIS — N182 Chronic kidney disease, stage 2 (mild): Secondary | ICD-10-CM

## 2015-09-26 DIAGNOSIS — R32 Unspecified urinary incontinence: Secondary | ICD-10-CM

## 2015-09-26 DIAGNOSIS — I10 Essential (primary) hypertension: Secondary | ICD-10-CM | POA: Diagnosis not present

## 2015-09-26 DIAGNOSIS — E038 Other specified hypothyroidism: Secondary | ICD-10-CM | POA: Diagnosis not present

## 2015-09-26 DIAGNOSIS — Z23 Encounter for immunization: Secondary | ICD-10-CM | POA: Diagnosis not present

## 2015-09-26 DIAGNOSIS — I679 Cerebrovascular disease, unspecified: Secondary | ICD-10-CM

## 2015-09-26 DIAGNOSIS — F3342 Major depressive disorder, recurrent, in full remission: Secondary | ICD-10-CM

## 2015-09-26 DIAGNOSIS — E7800 Pure hypercholesterolemia, unspecified: Secondary | ICD-10-CM | POA: Diagnosis not present

## 2015-09-26 LAB — BASIC METABOLIC PANEL
BUN: 22 mg/dL (ref 6–23)
CHLORIDE: 101 meq/L (ref 96–112)
CO2: 29 meq/L (ref 19–32)
CREATININE: 1.44 mg/dL — AB (ref 0.40–1.20)
Calcium: 10 mg/dL (ref 8.4–10.5)
GFR: 36.59 mL/min — ABNORMAL LOW (ref >60.00)
GLUCOSE: 91 mg/dL (ref 70–99)
Potassium: 4.4 mEq/L (ref 3.5–5.1)
Sodium: 138 mEq/L (ref 135–145)

## 2015-09-26 LAB — TSH: TSH: 1.64 u[IU]/mL (ref 0.35–4.50)

## 2015-09-26 NOTE — Patient Instructions (Signed)
BEFORE YOU LEAVE: -pneumococal 23 -labs -follow up in 4-6 months  Consider mammogram  -We have ordered labs or studies at this visit. It can take up to 1-2 weeks for results and processing. We will contact you with instructions IF your results are abnormal. Normal results will be released to your [facility]. If you have not heard from [place] or can not find your results in [facility] in 2 weeks please contact our office.  We recommend the following healthy lifestyle measures: - eat a healthy whole foods diet consisting of regular small meals composed of vegetables, fruits, beans, nuts, seeds, healthy meats such as [place] chicken and fish and whole grains.  - avoid sweets, [place] starchy foods, fried foods, fast food, processed foods, sodas, red meet and other fattening foods.  - get a least 150-300 minutes of aerobic exercise per week.

## 2015-09-26 NOTE — Addendum Note (Signed)
Addended by: Agnes Lawrence on: 09/26/2015 11:23 AM   Modules accepted: Orders

## 2015-09-26 NOTE — Progress Notes (Signed)
Pre visit review using our clinic review tool, if applicable. No additional management support is needed unless otherwise documented below in the visit note. 

## 2015-09-26 NOTE — Progress Notes (Addendum)
HPI:  HTN/CKD/hx CVA: -meds: atenolol-chlorthalidone 50-25 from prior PCP, lisinopril 20mg , norvasc 2.5 added 09/2012, statin -doing well -denies: CP, SOB, swelling, HA  HLD: -on statin, doing well -occ leg cramps -no cog impairment on this  Hypothyroid: -stable -denies: hot/cold intol, weight changes  Seasonal allergies: -meds: zyrtex, flonase  Depression and Anxiety: -meds: Luvox, nortriptyline -sees Dr. Gwenlyn Fudge  Urinary incontinence: -meds: Elmarie Mainland -sees urology -wants re-referral for insurance  GERD: -meds: pepcid  Hx Breast ca -oncology advised no longer needs mammograms given age -completed 5 years of Arimidex in 2016   ROS: See pertinent positives and negatives per HPI.  Past Medical History  Diagnosis Date  . Hypertension   . CVA (cerebral vascular accident) ([place])   . Hyperlipemia   . Chronic kidney disease   . Hx of breast cancer     Left  . Urinary incontinence   . Anxiety and depression   . Hypothyroid   . Hyperlipidemia   . Allergic rhinitis   . Peripheral neuropathy ([place])   . Anemia 06/17/2012  . Cataract 06/17/2012  . Hemorrhoids 07/03/2011  . Diverticulosis 03/14/2008    Qualifier: Diagnosis of  By: Lenna Gilford MD, Deborra Medina   . Irritable bowel syndrome 09/17/2007    Qualifier: Diagnosis of  By: Rosana Hoes CMA, Tammy    . Lung nodule 07/06/2011  . Glaucoma     Past Surgical History  Procedure Laterality Date  . Vaginal hysterectomy    . Spine surgery    .  fundiplication    . Eye surgery      TO DESOLVE AN AREA IN HER LEFT EYE?  . Breast surgery      left breast mastectomy     No family history on file.  Social History   Social History  . Marital Status: Widowed    Spouse Name: N/A  . Number of Children: N/A  . Years of Education: N/A   Social History Main Topics  . Smoking status: Former Smoker    Types: Cigarettes    Quit date: 11/23/1962  . Smokeless tobacco: Never Used  . Alcohol Use: Yes     Comment: PT STATES  DRINKS A BEER OR WINE OCCASSIONALLY, NOT WEEKLY  . Drug Use: No  . Sexual Activity: Not Asked   Other Topics Concern  . None   Social History Narrative   Work or School: none      Home Situation: lives with daughter and son and law      Spiritual Beliefs: [employer]      Lifestyle: no regular exercise; diet is ok              Current outpatient prescriptions:  .  acetaminophen (TYLENOL) 650 MG CR tablet, Take 650 mg by mouth every 8 (eight) hours as needed.  , Disp: , Rfl:  .  amLODipine (NORVASC) 2.5 MG tablet, Take 1 tablet (2.5 mg total) by mouth daily., Disp: 90 tablet, Rfl: 3 .  aspirin 325 MG tablet, Take 325 mg by mouth daily.  , Disp: , Rfl:  .  atenolol-chlorthalidone (TENORETIC) 50-25 MG per tablet, Take 1 tablet by mouth daily., Disp: 90 tablet, Rfl: 3 .  cetirizine (ZYRTEC) 10 MG tablet, Take 1 tablet (10 mg total) by mouth daily., Disp: 90 tablet, Rfl: 3 .  famotidine (PEPCID) 20 MG tablet, TAKE 1 TABLET TWICE DAILY AS NEEDED, Disp: 180 tablet, Rfl: 0 .  fesoterodine (TOVIAZ) 4 MG TB24 tablet, Take 1 tablet (4 mg total) by  mouth daily., Disp: 90 tablet, Rfl: 3 .  fexofenadine (ALLEGRA) 180 MG tablet, Take 180 mg by mouth daily., Disp: , Rfl:  .  fish oil-omega-3 fatty acids 1000 MG capsule, Take 2 g by mouth daily.  , Disp: , Rfl:  .  fluocinonide cream (LIDEX) 0.05 %, Apply topically 2 (two) times daily., Disp: 90 g, Rfl: 3 .  fluticasone (FLONASE) 50 MCG/ACT nasal spray, Place 2 sprays into both nostrils daily., Disp: 48 g, Rfl: 3 .  fluvoxaMINE (LUVOX) 100 MG tablet, Take as directed by Dr. Abner Greenspan, Disp: , Rfl:  .  latanoprost (XALATAN) 0.005 % ophthalmic solution, Place 1 drop into both eyes at bedtime.  , Disp: , Rfl:  .  levothyroxine (SYNTHROID, LEVOTHROID) 50 MCG tablet, Take 1 tablet (50 mcg total) by mouth daily., Disp: 90 tablet, Rfl: 3 .  lisinopril (PRINIVIL,ZESTRIL) 20 MG tablet, Take 1 tablet (20 mg total) by mouth daily., Disp: 90 tablet, Rfl:  3 .  mirabegron ER (MYRBETRIQ) 50 MG TB24 tablet, Take 50 mg by mouth every other day., Disp: , Rfl:  .  Multiple Vitamins-Minerals (WOMENS MULTIVITAMIN PLUS PO), Take 1 tablet by mouth daily.  , Disp: , Rfl:  .  NON FORMULARY, Post Surgical Bras, Disp: , Rfl:  .  NON FORMULARY, Non-Silicone Breast Prosthesis, Disp: , Rfl:  .  NON FORMULARY, Silicone Breast Prosthesis, Disp: , Rfl:  .  nortriptyline (PAMELOR) 10 MG capsule, Take 1 capsule (10 mg total) by mouth at bedtime., Disp: 90 capsule, Rfl: 3 .  potassium chloride SA (K-DUR,KLOR-CON) 20 MEQ tablet, Take 1 tablet (20 mEq total) by mouth daily., Disp: 90 tablet, Rfl: 3 .  simvastatin (ZOCOR) 20 MG tablet, Take 1 tablet (20 mg total) by mouth at bedtime., Disp: 90 tablet, Rfl: 3 .  timolol (BETIMOL) 0.5 % ophthalmic solution, Place 1 drop into both eyes 2 (two) times daily., Disp: , Rfl:  .  tobramycin (TOBREX) 0.3 % ophthalmic ointment, Place 1 application into the left eye as needed., Disp: , Rfl:  .  UNABLE TO FIND, 174.9 Left mastectomy   L8030-Silicone Breast Prosthesis-1, Disp: 1 each, Rfl: 0  EXAM:  Filed Vitals:   09/26/15 0925  BP: 140/70  Pulse: 85  Temp: 97.3 F (36.3 C)    Body mass index is 24.53 kg/(m^2).  GENERAL: vitals reviewed and listed above, alert, oriented, appears well hydrated and in no acute distress  HEENT: atraumatic, conjunttiva clear, no obvious abnormalities on inspection of external nose and ears  NECK: no obvious masses on inspection  LUNGS: clear to auscultation bilaterally, no wheezes, rales or rhonchi, good air movement  CV: HRRR, no peripheral edema  MS: moves all extremities without noticeable abnormality  PSYCH: pleasant and cooperative, no obvious depression or anxiety  ASSESSMENT AND PLAN:  Discussed the following assessment and plan:  Other specified hypothyroidism - Plan: TSH  HYPERCHOLESTEROLEMIA  Essential hypertension - Plan: Basic metabolic panel  Cerebrovascular  disease  Chronic kidney disease (CKD), stage II (mild)  Urinary incontinence, unspecified incontinence type - Plan: Ambulatory referral to Urology  Recurrent major depressive disorder, in full remission ([place])  Need for prophylactic vaccination against Streptococcus pneumoniae (pneumococcus) - Plan: Pneumococcal polysaccharide vaccine 23-valent greater than or equal to 2yo subcutaneous/IM  -Patient advised to return or notify a doctor immediately if symptoms worsen or persist or new concerns arise.  Patient Instructions  BEFORE YOU LEAVE: -pneumococal 23 -labs -follow up in 4-6 months  Consider mammogram  -We have ordered labs  or studies at this visit. It can take up to 1-2 weeks for results and processing. We will contact you with instructions IF your results are abnormal. Normal results will be released to your [facility]. If you have not heard from [place] or can not find your results in [facility] in 2 weeks please contact our office.  We recommend the following healthy lifestyle measures: - eat a healthy whole foods diet consisting of regular small meals composed of vegetables, fruits, beans, nuts, seeds, healthy meats such as [place] chicken and fish and whole grains.  - avoid sweets, [place] starchy foods, fried foods, fast food, processed foods, sodas, red meet and other fattening foods.  - get a least 150-300 minutes of aerobic exercise per week.            Colin Benton R.

## 2015-09-27 ENCOUNTER — Other Ambulatory Visit: Payer: Self-pay | Admitting: Family Medicine

## 2015-10-15 ENCOUNTER — Telehealth: Payer: Self-pay | Admitting: Family Medicine

## 2015-10-15 MED ORDER — FAMOTIDINE 20 MG PO TABS: 20.0000 mg | ORAL_TABLET | Freq: Two times a day (BID) | ORAL | Status: DC | PRN

## 2015-10-15 NOTE — Telephone Encounter (Signed)
Rx sent via escribe to [facility].

## 2015-10-15 NOTE — Telephone Encounter (Signed)
Ok to send 90 days with 3 refills.

## 2015-10-15 NOTE — Telephone Encounter (Signed)
Patient would like her Famotidine 20mg  tablets medication refilled.  Please fax to mail order company: Fax # [phone]

## 2015-10-21 ENCOUNTER — Other Ambulatory Visit: Payer: Self-pay | Admitting: Family Medicine

## 2015-11-12 IMAGING — CR DG CHEST 2V
2 series · 2 of 2 positions shown · non-contrast
Comparison: 02/21/2013

CLINICAL DATA: Dry cough

EXAM:
CHEST  2 VIEW

[view not recorded (1 of 2)]
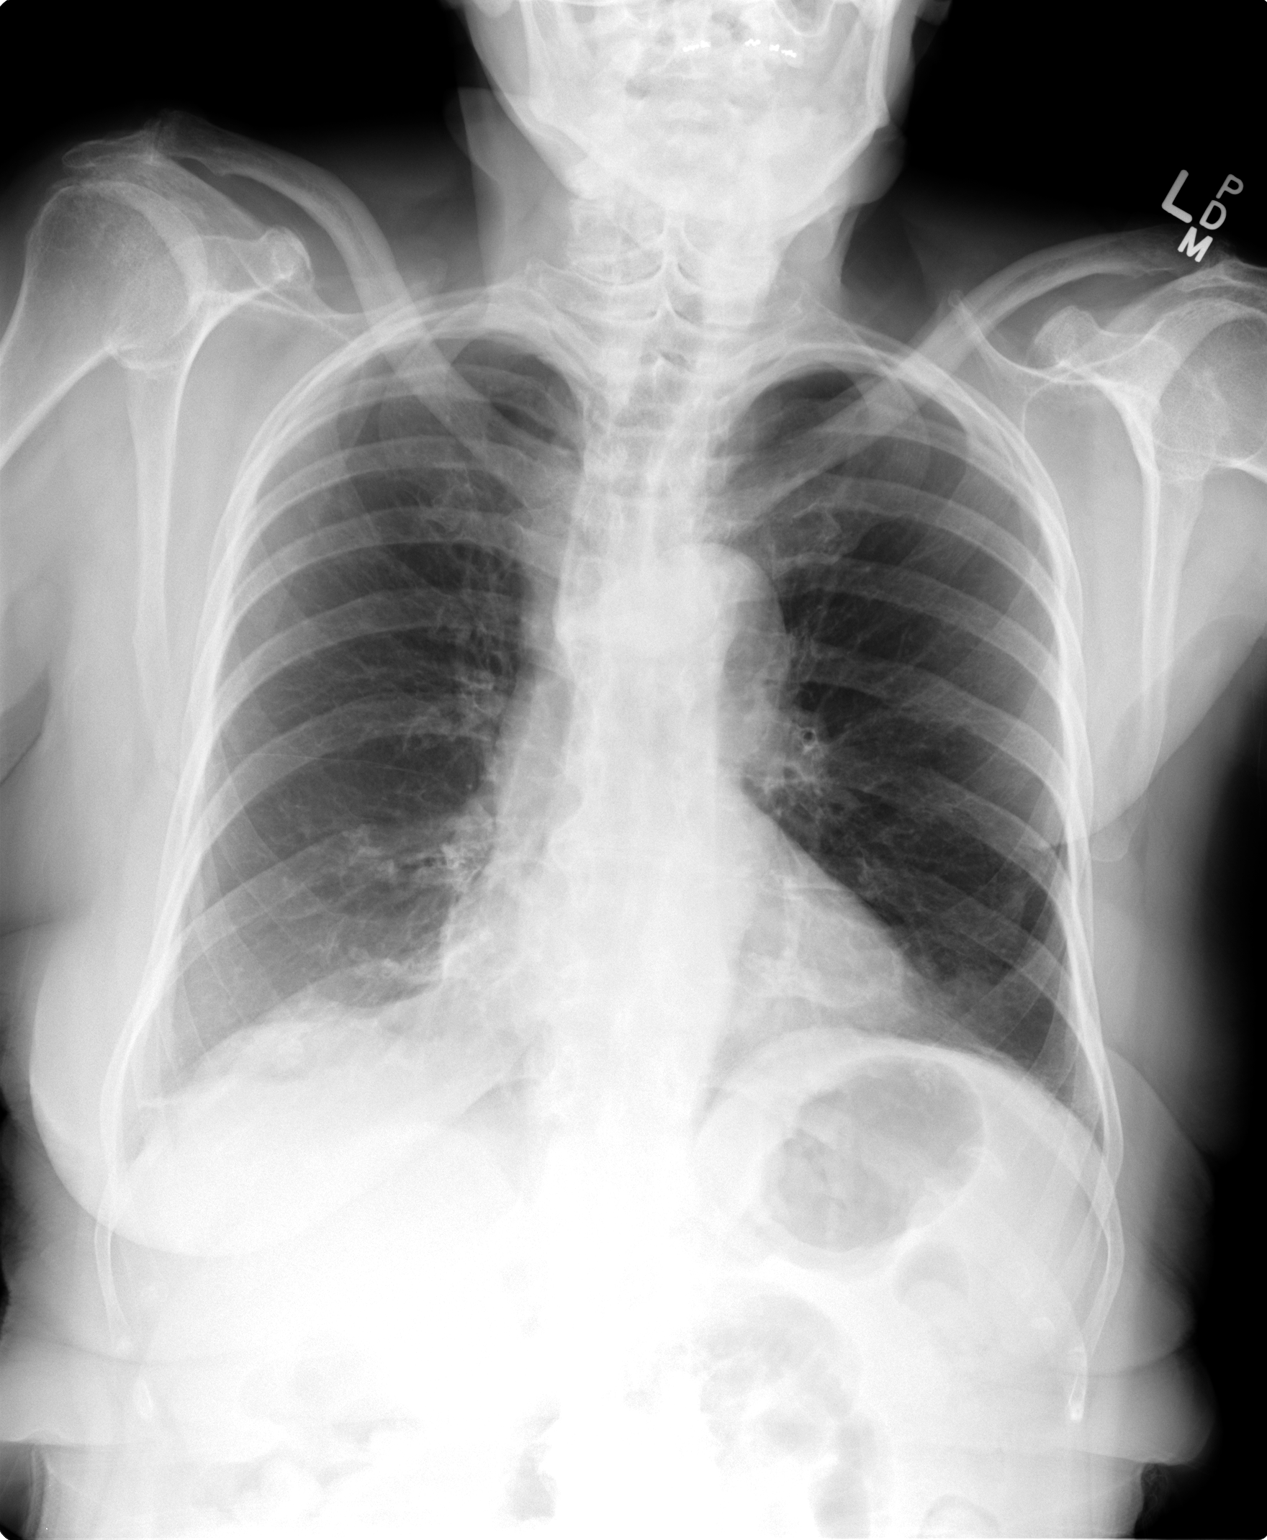

[view not recorded (2 of 2)]
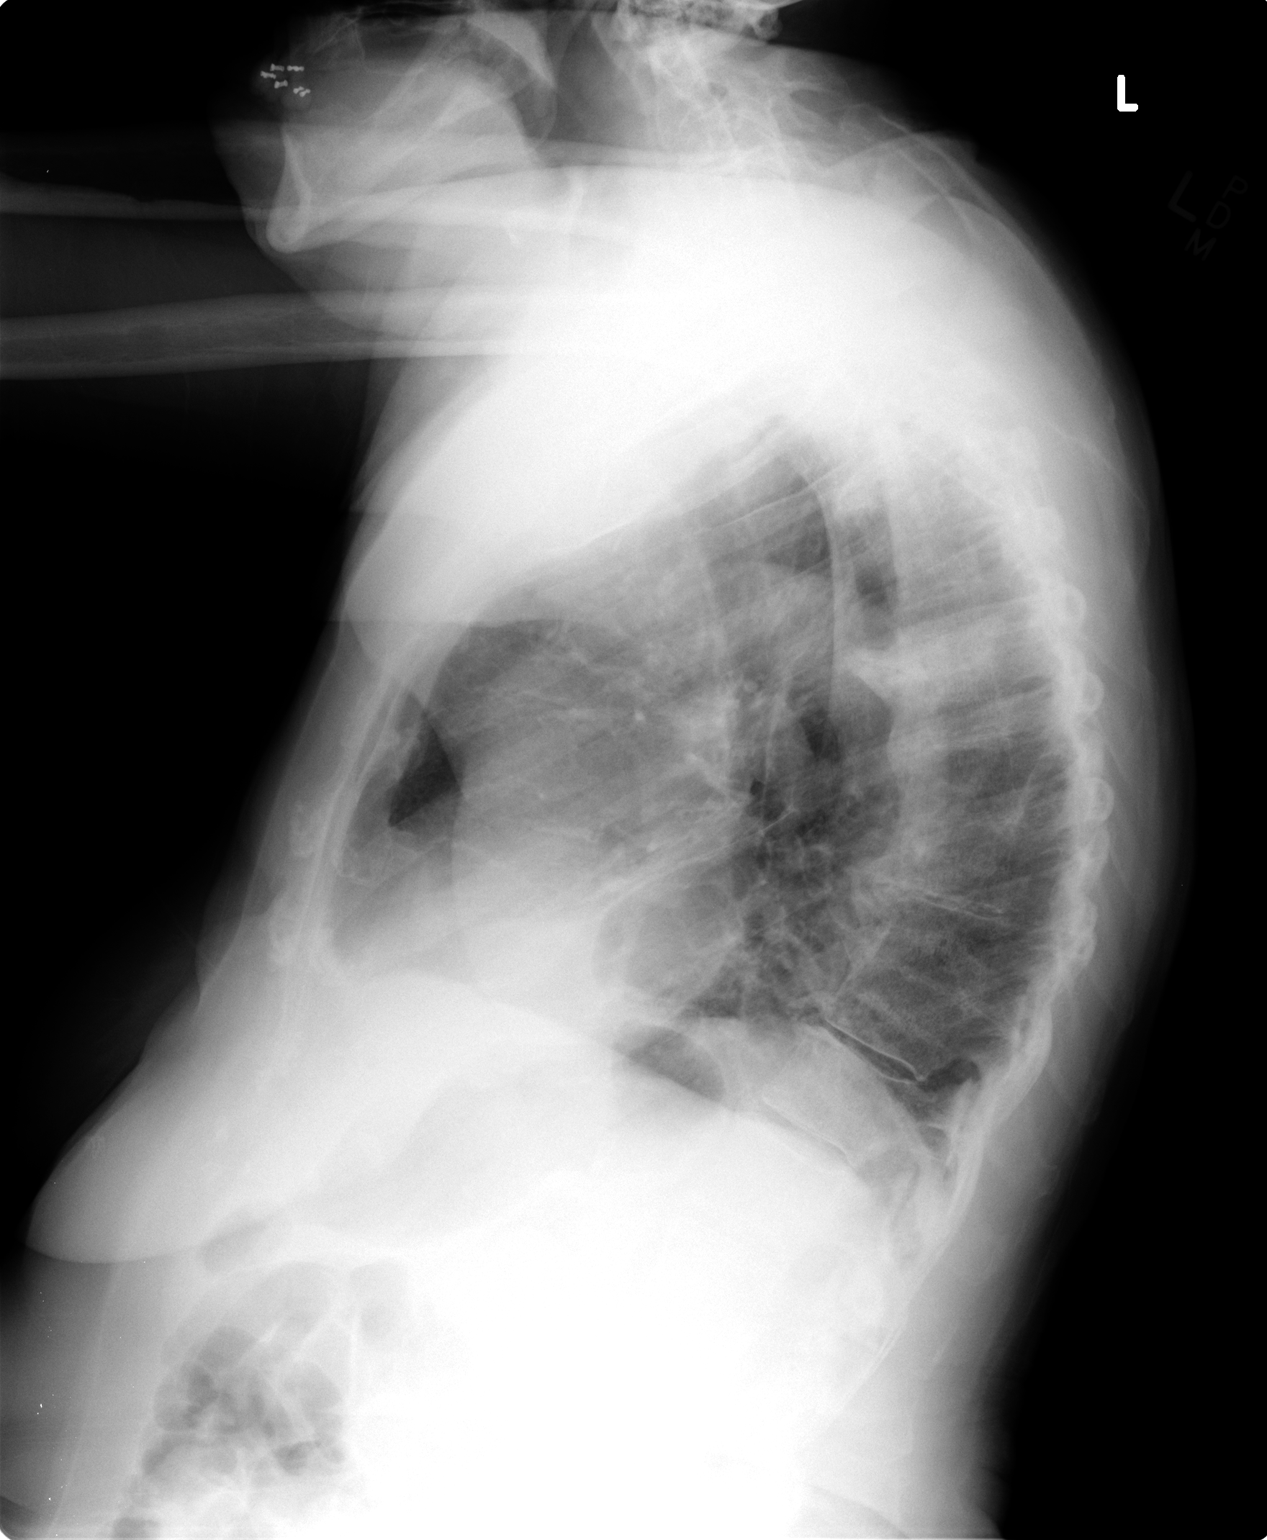

[2 of 2 positions shown; findings below may reference images not displayed]

FINDINGS: The cardiac shadow is stable. The lungs are well aerated
bilaterally. Minimal scarring is again seen in the right lung base.
Chronic changes are noted in the thoracic spine.
IMPRESSION: No acute abnormality noted.

## 2015-12-30 DIAGNOSIS — H34832 Tributary (branch) retinal vein occlusion, left eye, with macular edema: Secondary | ICD-10-CM | POA: Diagnosis not present

## 2015-12-30 DIAGNOSIS — H353132 Nonexudative age-related macular degeneration, bilateral, intermediate dry stage: Secondary | ICD-10-CM | POA: Diagnosis not present

## 2015-12-30 DIAGNOSIS — H35371 Puckering of macula, right eye: Secondary | ICD-10-CM | POA: Diagnosis not present

## 2015-12-30 DIAGNOSIS — H43813 Vitreous degeneration, bilateral: Secondary | ICD-10-CM | POA: Diagnosis not present

## 2016-01-09 ENCOUNTER — Telehealth: Payer: Self-pay | Admitting: Family Medicine

## 2016-01-09 MED ORDER — POTASSIUM CHLORIDE CRYS ER 20 MEQ PO TBCR: 20.0000 meq | EXTENDED_RELEASE_TABLET | Freq: Every day | ORAL | Status: DC

## 2016-01-09 MED ORDER — SIMVASTATIN 20 MG PO TABS: 20.0000 mg | ORAL_TABLET | Freq: Every day | ORAL | Status: DC

## 2016-01-09 NOTE — Telephone Encounter (Signed)
Rxs done. 

## 2016-01-09 NOTE — Telephone Encounter (Signed)
Pt has new pharm [facility] mail order. Pt needs new rxs  Simvastatin 20 mg and potassium chloride 20 meq  for 90 day supply with refills. Phone # [phone] and fax # [phone]

## 2016-01-16 DIAGNOSIS — F329 Major depressive disorder, single episode, unspecified: Secondary | ICD-10-CM | POA: Diagnosis not present

## 2016-02-12 DIAGNOSIS — N3946 Mixed incontinence: Secondary | ICD-10-CM | POA: Diagnosis not present

## 2016-02-12 DIAGNOSIS — Z0000 Encounter for general adult medical examination without abnormal findings: Secondary | ICD-10-CM | POA: Diagnosis not present

## 2016-03-09 DIAGNOSIS — H353112 Nonexudative age-related macular degeneration, right eye, intermediate dry stage: Secondary | ICD-10-CM | POA: Diagnosis not present

## 2016-03-09 DIAGNOSIS — H34832 Tributary (branch) retinal vein occlusion, left eye, with macular edema: Secondary | ICD-10-CM | POA: Diagnosis not present

## 2016-03-09 DIAGNOSIS — H31092 Other chorioretinal scars, left eye: Secondary | ICD-10-CM | POA: Diagnosis not present

## 2016-03-09 DIAGNOSIS — H43813 Vitreous degeneration, bilateral: Secondary | ICD-10-CM | POA: Diagnosis not present

## 2016-03-25 ENCOUNTER — Ambulatory Visit: Payer: Commercial Managed Care - HMO | Admitting: Family Medicine

## 2016-03-27 ENCOUNTER — Ambulatory Visit (INDEPENDENT_AMBULATORY_CARE_PROVIDER_SITE_OTHER): Payer: PPO | Admitting: Family Medicine

## 2016-03-27 ENCOUNTER — Encounter: Payer: Self-pay | Admitting: Family Medicine

## 2016-03-27 VITALS — BP 118/70 | HR 88 | Temp 97.8°F | Ht 58.75 in | Wt 117.0 lb

## 2016-03-27 DIAGNOSIS — I10 Essential (primary) hypertension: Secondary | ICD-10-CM | POA: Diagnosis not present

## 2016-03-27 DIAGNOSIS — E038 Other specified hypothyroidism: Secondary | ICD-10-CM | POA: Diagnosis not present

## 2016-03-27 DIAGNOSIS — E785 Hyperlipidemia, unspecified: Secondary | ICD-10-CM

## 2016-03-27 DIAGNOSIS — I679 Cerebrovascular disease, unspecified: Secondary | ICD-10-CM | POA: Diagnosis not present

## 2016-03-27 DIAGNOSIS — N182 Chronic kidney disease, stage 2 (mild): Secondary | ICD-10-CM

## 2016-03-27 DIAGNOSIS — J309 Allergic rhinitis, unspecified: Secondary | ICD-10-CM

## 2016-03-27 MED ORDER — FLUTICASONE PROPIONATE 50 MCG/ACT NA SUSP: 2.0000 | Freq: Every day | NASAL | Status: DC

## 2016-03-27 NOTE — Progress Notes (Signed)
Pre visit review using our clinic review tool, if applicable. No additional management support is needed unless otherwise documented below in the visit note. 

## 2016-03-27 NOTE — Progress Notes (Signed)
HPI:  Follow up  Daughter is concerned as pt will climb up on ladder or step stool while she is out to change curtains, clean closet, etc. Daughter is worried she could fall. No recent falls.  HTN/CKD/hx CVA: -meds: asa, atenolol-chlorthalidone 50-25 from prior PCP, lisinopril '20mg'$ , norvasc 2.5 added 09/2012, statin -doing well -denies: CP, SOB, swelling, HA  HLD: -on statin, doing well -occ leg cramps -no cog impairment on this  Hypothyroid: -stable -denies: hot/cold intol, weight changes  Seasonal allergies: -meds: zyrtec -worse lately with pnd, clear rhinorrhea and occ cough -not using her flonase, forgot about this and out of refills -no fevers, COB, DOE  Depression and Anxiety: -meds: Luvox, nortriptyline -sees Dr. Gwenlyn Fudge  Urinary incontinence: -meds: Elmarie Mainland -sees urology  GERD: -meds: pepcid  Hx Breast ca -oncology advised no longer needs mammograms given age -completed 5 years of Arimidex in 2016   ROS: See pertinent positives and negatives per HPI.  Past Medical History  Diagnosis Date  . Hypertension   . CVA (cerebral vascular accident) ([place])   . Hyperlipemia   . Chronic kidney disease   . Hx of breast cancer     Left  . Urinary incontinence   . Anxiety and depression   . Hypothyroid   . Hyperlipidemia   . Allergic rhinitis   . Peripheral neuropathy ([place])   . Anemia 06/17/2012  . Cataract 06/17/2012  . Hemorrhoids 07/03/2011  . Diverticulosis 03/14/2008    Qualifier: Diagnosis of  By: Lenna Gilford MD, Deborra Medina   . Irritable bowel syndrome 09/17/2007    Qualifier: Diagnosis of  By: Rosana Hoes CMA, Tammy    . Lung nodule 07/06/2011  . Glaucoma     Past Surgical History  Procedure Laterality Date  . Vaginal hysterectomy    . Spine surgery    .  fundiplication    . Eye surgery      TO DESOLVE AN AREA IN HER LEFT EYE?  . Breast surgery      left breast mastectomy     No family history on file.  Social History   Social History  .  Marital Status: Widowed    Spouse Name: N/A  . Number of Children: N/A  . Years of Education: N/A   Social History Main Topics  . Smoking status: Former Smoker    Types: Cigarettes    Quit date: 11/23/1962  . Smokeless tobacco: Never Used  . Alcohol Use: Yes     Comment: PT STATES DRINKS A BEER OR WINE OCCASSIONALLY, NOT WEEKLY  . Drug Use: No  . Sexual Activity: Not Asked   Other Topics Concern  . None   Social History Narrative   Work or School: none      Home Situation: lives with daughter and son and law      Spiritual Beliefs: [employer]      Lifestyle: no regular exercise; diet is ok              Current outpatient prescriptions:  .  acetaminophen (TYLENOL) 650 MG CR tablet, Take 650 mg by mouth every 8 (eight) hours as needed.  , Disp: , Rfl:  .  amLODipine (NORVASC) 2.5 MG tablet, TAKE 1 TABLET (2.5 MG TOTAL) BY MOUTH DAILY., Disp: 90 tablet, Rfl: 1 .  aspirin 325 MG tablet, Take 325 mg by mouth daily.  , Disp: , Rfl:  .  atenolol-chlorthalidone (TENORETIC) 50-25 MG per tablet, Take 1 tablet by mouth daily., Disp: 90 tablet, Rfl:  3 .  cetirizine (ZYRTEC) 10 MG tablet, Take 1 tablet (10 mg total) by mouth daily., Disp: 90 tablet, Rfl: 3 .  famotidine (PEPCID) 20 MG tablet, Take 1 tablet (20 mg total) by mouth 2 (two) times daily as needed., Disp: 180 tablet, Rfl: 3 .  fesoterodine (TOVIAZ) 4 MG TB24 tablet, Take 1 tablet (4 mg total) by mouth daily., Disp: 90 tablet, Rfl: 3 .  fish oil-omega-3 fatty acids 1000 MG capsule, Take 2 g by mouth daily.  , Disp: , Rfl:  .  fluocinonide cream (LIDEX) 0.05 %, Apply topically 2 (two) times daily., Disp: 90 g, Rfl: 3 .  fluticasone (FLONASE) 50 MCG/ACT nasal spray, Place 2 sprays into both nostrils daily., Disp: 16 g, Rfl: 3 .  fluvoxaMINE (LUVOX) 100 MG tablet, Take as directed by Dr. Abner Greenspan, Disp: , Rfl:  .  latanoprost (XALATAN) 0.005 % ophthalmic solution, Place 1 drop into both eyes at bedtime.  , Disp: , Rfl:  .   levothyroxine (SYNTHROID, LEVOTHROID) 50 MCG tablet, Take 1 tablet (50 mcg total) by mouth daily., Disp: 90 tablet, Rfl: 3 .  lisinopril (PRINIVIL,ZESTRIL) 20 MG tablet, Take 1 tablet (20 mg total) by mouth daily., Disp: 90 tablet, Rfl: 3 .  mirabegron ER (MYRBETRIQ) 50 MG TB24 tablet, Take 50 mg by mouth every other day., Disp: , Rfl:  .  Multiple Vitamins-Minerals (WOMENS MULTIVITAMIN PLUS PO), Take 1 tablet by mouth daily.  , Disp: , Rfl:  .  NON FORMULARY, Post Surgical Bras, Disp: , Rfl:  .  NON FORMULARY, Non-Silicone Breast Prosthesis, Disp: , Rfl:  .  NON FORMULARY, Silicone Breast Prosthesis, Disp: , Rfl:  .  nortriptyline (PAMELOR) 10 MG capsule, Take 1 capsule (10 mg total) by mouth at bedtime., Disp: 90 capsule, Rfl: 3 .  potassium chloride SA (K-DUR,KLOR-CON) 20 MEQ tablet, Take 1 tablet (20 mEq total) by mouth daily., Disp: 90 tablet, Rfl: 1 .  simvastatin (ZOCOR) 20 MG tablet, Take 1 tablet (20 mg total) by mouth at bedtime., Disp: 90 tablet, Rfl: 1 .  timolol (BETIMOL) 0.5 % ophthalmic solution, Place 1 drop into both eyes 2 (two) times daily., Disp: , Rfl:  .  tobramycin (TOBREX) 0.3 % ophthalmic ointment, Place 1 application into the left eye as needed., Disp: , Rfl:  .  UNABLE TO FIND, 174.9 Left mastectomy   L8030-Silicone Breast Prosthesis-1, Disp: 1 each, Rfl: 0  EXAM:  Filed Vitals:   03/27/16 0927  BP: 118/70  Pulse: 88  Temp: 97.8 F (36.6 C)    Body mass index is 23.84 kg/(m^2).  GENERAL: vitals reviewed and listed above, alert, oriented, appears well hydrated and in no acute distress  HEENT: atraumatic, conjunttiva clear, no obvious abnormalities on inspection of external nose and ears, normal appearance of ear canals and TMs, clear nasal congestion, mild post oropharyngeal erythema with PND, no tonsillar edema or exudate, no sinus TTP  NECK: no obvious masses on inspection  LUNGS: clear to auscultation bilaterally, no wheezes, rales or rhonchi, good air  movement  CV: HRRR, no peripheral edema  MS: moves all extremities without noticeable abnormality  PSYCH: pleasant and cooperative, no obvious depression or anxiety  ASSESSMENT AND PLAN:  Discussed the following assessment and plan:  Essential hypertension -cont current tx  Chronic kidney disease (CKD), stage II (mild) -stable  Cerebrovascular disease -cont current tx  Other specified hypothyroidism -cont current tx  Hyperlipemia -continue current tx  Allergic rhinitis, unspecified allergic rhinitis type -add back flonase  -  fall precautions, [place] advised -medicare exam in September, flu shot then -Patient advised to return or notify a doctor immediately if symptoms worsen or persist or new concerns arise.  Patient Instructions  Before you leave: -Schedule Medicare annual wellness exam in September; plan to come fasting if you can  We recommend the following healthy lifestyle measures: - eat a healthy whole foods diet consisting of regular small meals composed of vegetables, fruits, beans, nuts, seeds, healthy meats such as [place] chicken and fish and whole grains.  - avoid sweets, [place] starchy foods, fried foods, fast food, processed foods, sodas, red meet and other fattening foods.  - get a least 150-300 minutes of aerobic exercise per week.   Please please please be careful and do not climb up on stools or ladders unless somebody is assisting you.  For the allergies and cough, please add back Flonase 2 sprays each nostril daily for 1 month, then 1 spray each nostril daily.     Colin Benton R.

## 2016-03-27 NOTE — Patient Instructions (Signed)
Before you leave: -Schedule Medicare annual wellness exam in September; plan to come fasting if you can  We recommend the following healthy lifestyle measures: - eat a healthy whole foods diet consisting of regular small meals composed of vegetables, fruits, beans, nuts, seeds, healthy meats such as [place] chicken and fish and whole grains.  - avoid sweets, [place] starchy foods, fried foods, fast food, processed foods, sodas, red meet and other fattening foods.  - get a least 150-300 minutes of aerobic exercise per week.   Please please please be careful and do not climb up on stools or ladders unless somebody is assisting you.  For the allergies and cough, please add back Flonase 2 sprays each nostril daily for 1 month, then 1 spray each nostril daily.

## 2016-04-06 ENCOUNTER — Telehealth: Payer: Self-pay | Admitting: Family Medicine

## 2016-04-06 MED ORDER — FAMOTIDINE 20 MG PO TABS: 20.0000 mg | ORAL_TABLET | Freq: Two times a day (BID) | ORAL | Status: DC | PRN

## 2016-04-06 MED ORDER — LISINOPRIL 20 MG PO TABS: 20.0000 mg | ORAL_TABLET | Freq: Every day | ORAL | Status: DC

## 2016-04-06 NOTE — Telephone Encounter (Signed)
Rx done. 

## 2016-04-06 NOTE — Telephone Encounter (Signed)
Pt needs new rxs lisinopril 20 mg, famotidine 20 mg #90 each w/refills    sent to [facility] mail [facility]

## 2016-04-15 ENCOUNTER — Other Ambulatory Visit: Payer: Self-pay | Admitting: Family Medicine

## 2016-05-15 DIAGNOSIS — H353112 Nonexudative age-related macular degeneration, right eye, intermediate dry stage: Secondary | ICD-10-CM | POA: Diagnosis not present

## 2016-05-15 DIAGNOSIS — H35371 Puckering of macula, right eye: Secondary | ICD-10-CM | POA: Diagnosis not present

## 2016-05-15 DIAGNOSIS — H34832 Tributary (branch) retinal vein occlusion, left eye, with macular edema: Secondary | ICD-10-CM | POA: Diagnosis not present

## 2016-05-15 DIAGNOSIS — H43813 Vitreous degeneration, bilateral: Secondary | ICD-10-CM | POA: Diagnosis not present

## 2016-05-18 ENCOUNTER — Telehealth: Payer: Self-pay | Admitting: Family Medicine

## 2016-05-18 MED ORDER — LEVOTHYROXINE SODIUM 50 MCG PO TABS: 50.0000 ug | ORAL_TABLET | Freq: Every day | ORAL | Status: DC

## 2016-05-18 NOTE — Telephone Encounter (Signed)
Rx done. 

## 2016-05-18 NOTE — Telephone Encounter (Signed)
Pt needs new rx levothyroxine 50  Mcg #90 w/refill send to [facility] pharm fax [phone]

## 2016-07-09 DIAGNOSIS — N3944 Nocturnal enuresis: Secondary | ICD-10-CM | POA: Diagnosis not present

## 2016-07-09 DIAGNOSIS — N3946 Mixed incontinence: Secondary | ICD-10-CM | POA: Diagnosis not present

## 2016-07-29 NOTE — Progress Notes (Signed)
Medicare Annual Preventive Care Visit  (initial annual wellness or annual wellness exam)  Concerns and/or follow up today:  Not very active: -doesn't like going out walking by herself, but does feel needs to get more exercise -denies weakness, numbness, falls, malaise, fevers, SOB, cough  HTN/CKD/hx CVA: -meds: asa, atenolol-chlorthalidone 50-25 from prior PCP, lisinopril 20mg , norvasc 2.5 added 09/2012, statin -doing well -denies: CP, SOB, swelling, HA  HLD: -on statin, doing well -occ leg cramps -no cog impairment on this  Hypothyroid: -stable -denies: hot/cold intol, weight changes  Seasonal allergies: -meds: zyrtec -worse lately with pnd, clear rhinorrhea and occ cough -not using her flonase, forgot about this and out of refills -no fevers, COB, DOE  Depression and Anxiety: -meds: Luvox, nortriptyline -sees Dr. Gwenlyn Fudge -reports mood is good, loves to nit and involved in church  Urinary incontinence: -meds: Elmarie Mainland -sees urology  GERD: -meds: pepcid  Hx Breast ca -oncology advised no longer needs mammograms given age and she has declined and declines today breast exam or mammo -completed 5 years of Arimidex in 2016   ROS: negative for report of fevers, unintentional weight loss, vision changes, vision loss, hearing loss or change, chest pain, sob, hemoptysis, melena, hematochezia, hematuria, genital discharge or lesions, falls, bleeding or bruising, loc, thoughts of suicide or self harm, memory loss  1.) Patient-completed health risk assessment  - completed and reviewed, see scanned documentation  2.) Review of Medical History: -PMH, PSH, Family History and current specialty and care providers reviewed and updated and listed below  - see scanned in document in chart and below  Past Medical History:  Diagnosis Date  . Allergic rhinitis   . Anemia 06/17/2012  . Anxiety and depression   . Cataract 06/17/2012  . Chronic kidney disease   .  CVA (cerebral vascular accident) ([place])   . Diverticulosis 03/14/2008   Qualifier: Diagnosis of  By: Lenna Gilford MD, Deborra Medina   . Glaucoma   . Hemorrhoids 07/03/2011  . Hx of breast cancer    Left  . Hyperlipemia   . Hyperlipidemia   . Hypertension   . Hypothyroid   . Irritable bowel syndrome 09/17/2007   Qualifier: Diagnosis of  By: Rosana Hoes CMA, Tammy    . Lung nodule 07/06/2011  . Peripheral neuropathy ([place])   . Urinary incontinence     Past Surgical History:  Procedure Laterality Date  .  FUNDIPLICATION    . BREAST SURGERY     left breast mastectomy   . EYE SURGERY     TO DESOLVE AN AREA IN HER LEFT EYE?  . SPINE SURGERY    . VAGINAL HYSTERECTOMY      Social History   Social History  . Marital status: Widowed    Spouse name: N/A  . Number of children: N/A  . Years of education: N/A   Occupational History  . Not on file.   Social History Main Topics  . Smoking status: Former Smoker    Types: Cigarettes    Quit date: 11/23/1962  . Smokeless tobacco: Never Used  . Alcohol use Yes     Comment: PT STATES DRINKS A BEER OR WINE OCCASSIONALLY, NOT WEEKLY  . Drug use: No  . Sexual activity: Not on file   Other Topics Concern  . Not on file   Social History Narrative   Work or School: none      Home Situation: lives with daughter and son and law      Spiritual Beliefs: Print production planner  Lifestyle: no regular exercise; diet is ok             No family history on file.  Current Outpatient Prescriptions on File Prior to Visit  Medication Sig Dispense Refill  . acetaminophen (TYLENOL) 650 MG CR tablet Take 650 mg by mouth every 8 (eight) hours as needed.      Marland Kitchen amLODipine (NORVASC) 2.5 MG tablet TAKE 1 TABLET BY MOUTH EVERY DAY 90 tablet 2  . aspirin 325 MG tablet Take 325 mg by mouth daily.      Marland Kitchen atenolol-chlorthalidone (TENORETIC) 50-25 MG per tablet Take 1 tablet by mouth daily. 90 tablet 3  . cetirizine (ZYRTEC) 10 MG tablet Take 1 tablet (10 mg total) by mouth  daily. 90 tablet 3  . famotidine (PEPCID) 20 MG tablet Take 1 tablet (20 mg total) by mouth 2 (two) times daily as needed. 180 tablet 1  . fesoterodine (TOVIAZ) 4 MG TB24 tablet Take 1 tablet (4 mg total) by mouth daily. 90 tablet 3  . fish oil-omega-3 fatty acids 1000 MG capsule Take 2 g by mouth daily.      . fluocinonide cream (LIDEX) 0.05 % Apply topically 2 (two) times daily. 90 g 3  . fluticasone (FLONASE) 50 MCG/ACT nasal spray Place 2 sprays into both nostrils daily. 16 g 3  . fluvoxaMINE (LUVOX) 100 MG tablet Take as directed by Dr. Abner Greenspan    . latanoprost (XALATAN) 0.005 % ophthalmic solution Place 1 drop into both eyes at bedtime.      Marland Kitchen levothyroxine (SYNTHROID, LEVOTHROID) 50 MCG tablet Take 1 tablet (50 mcg total) by mouth daily. 90 tablet 1  . lisinopril (PRINIVIL,ZESTRIL) 20 MG tablet Take 1 tablet (20 mg total) by mouth daily. 90 tablet 1  . mirabegron ER (MYRBETRIQ) 50 MG TB24 tablet Take 50 mg by mouth every other day.    . Multiple Vitamins-Minerals (WOMENS MULTIVITAMIN PLUS PO) Take 1 tablet by mouth daily.      . NON FORMULARY Post Surgical Bras    . NON FORMULARY Non-Silicone Breast Prosthesis    . NON FORMULARY Silicone Breast Prosthesis    . nortriptyline (PAMELOR) 10 MG capsule Take 1 capsule (10 mg total) by mouth at bedtime. 90 capsule 3  . potassium chloride SA (K-DUR,KLOR-CON) 20 MEQ tablet Take 1 tablet (20 mEq total) by mouth daily. 90 tablet 1  . simvastatin (ZOCOR) 20 MG tablet Take 1 tablet (20 mg total) by mouth at bedtime. 90 tablet 1  . timolol (BETIMOL) 0.5 % ophthalmic solution Place 1 drop into both eyes 2 (two) times daily.    Marland Kitchen tobramycin (TOBREX) 0.3 % ophthalmic ointment Place 1 application into the left eye as needed.    Marland Kitchen UNABLE TO FIND 174.9 Left mastectomy   L8030-Silicone Breast Prosthesis-1 1 each 0   No current facility-administered medications on file prior to visit.      3.) Review of functional ability and level of safety:  Any  difficulty hearing?  NO  History of falling?  NO  Any trouble with IADLs - using a phone, using transportation, grocery shopping, preparing meals, doing housework, doing laundry, taking medications and managing money? Lives with daughter and daughter takes care of most of this  Advance Directives? yes  See summary of recommendations in Patient Instructions below.  4.) Physical Exam Vitals:   07/30/16 0815  BP: (!) 144/80  Pulse: 90  Temp: 97.5 F (36.4 C)   Estimated body mass index is 24.24 kg/m as  calculated from the following:   Height as of this encounter: 4\' 10"  (1.473 m).   Weight as of this encounter: 116 lb (52.6 kg).  EKG (optional): deferred  General: alert, appear well hydrated and in no acute distress  HEENT: visual acuity grossly intact  CV: HRRR  Lungs: CTA bilaterally  Psych: pleasant and cooperative, no obvious depression or anxiety  Cognitive function grossly intact  See patient instructions for recommendations.  Education and counseling regarding the above review of health provided with a plan for the following: -see scanned patient completed form for further details -fall prevention strategies discussed  -healthy lifestyle discussed -importance and resources for completing advanced directives discussed -see patient instructions below for any other recommendations provided  4)The following written screening schedule of preventive measures were reviewed with assessment and plan made per below, orders and patient instructions:      AAA screening done if applicable     Alcohol screening done     Obesity Screening and counseling done     STI screening (Hep C if born 45-65) offered and per pt wishes     Tobacco Screening done done       Pneumococcal (PPSV23 -one dose after 64, one before if risk factors), influenza yearly and hepatitis B vaccines (if high risk - end stage renal disease, IV drugs, homosexual men, live in home for mentally  retarded, hemophilia receiving factors) ASSESSMENT/PLAN: done if applicable      Screening mammograph (yearly if >40) ASSESSMENT/PLAN: n/a, declined      Screening Pap smear/pelvic exam (q2 years) ASSESSMENT/PLAN: n/a, declined      Colorectal cancer screening (FOBT yearly or flex sig q4y or colonoscopy q10y or barium enema q4y) ASSESSMENT/PLAN: declined/na      Diabetes outpatient self-management training services ASSESSMENT/PLAN: utd or done      Bone mass measurements(covered q2y if indicated - estrogen def, osteoporosis, hyperparathyroid, vertebral abnormalities, osteoporosis or steroids) ASSESSMENT/PLAN: declined      Screening for glaucoma(q1y if high risk - diabetes, FH, AA and > 50 or hispanic and > 65) ASSESSMENT/PLAN: utd or advised      Medical nutritional therapy for individuals with diabetes or renal disease ASSESSMENT/PLAN: see orders      Cardiovascular screening blood tests (lipids q5y) ASSESSMENT/PLAN: see orders and labs      Diabetes screening tests ASSESSMENT/PLAN: see orders and labs   7.) Summary: -risk factors and conditions per above assessment were discussed and treatment, recommendations and referrals were offered per documentation above and orders and patient instructions.  Medicare annual wellness visit, subsequent  Essential hypertension - Plan: Basic metabolic panel, CBC (no diff), CANCELED: EKG 12-Lead  Other specified hypothyroidism - Plan: TSH  Hyperlipemia - Plan: Lipid Panel  Chronic kidney disease (CKD), stage II (mild)  Encounter for immunization - Plan: Flu vaccine HIGH DOSE PF  -labs -flu shot  -discussed exercise options and offered PT - she prefers to do videos at home and we reviewed several options on youtube at her visit with pt and daughter   Patient Instructions  BEFORE YOU LEAVE: -follow up: 4 months -labs -flu shot  Consider chair exercises. Look on you tube as I showed you.  We have ordered labs or studies at  this visit. It can take up to 1-2 weeks for results and processing. IF results require follow up or explanation, we will call you with instructions. Clinically stable results will be released to your [facility]. If you have not heard from [place] or cannot  find your results in [facility] in 2 weeks please contact our office at [phone].  If you are not yet signed up for [facility], please consider signing up.  We recommend the following healthy lifestyle for LIFE: 1) Small portions.   Tip: eat off of a salad plate instead of a dinner plate.  Tip: It is ok to feel hungry after a meal - that likely means you ate an appropriate portion.  Tip: if you need more or a snack choose fruits, veggies and/or a handful of nuts or seeds.  2) Eat a healthy clean diet.  * Tip: Avoid (less then 1 serving per week): processed foods, sweets, sweetened drinks, [place] starches (rice, flour, bread, potatoes, pasta, etc), red meat, fast foods, butter  *Tip: CHOOSE instead   * 5-9 servings per day of fresh or frozen fruits and vegetables (but not corn, potatoes, bananas, canned or dried fruit)   *nuts and seeds, beans   *olives and olive oil   *small portions of lean meats such as fish and [place] chicken    *small portions of whole grains  3)Get at least 150 minutes of sweaty aerobic exercise per week.  4)Reduce stress - consider counseling, meditation and relaxation to balance other aspects of your life.      Colin Benton R., DO

## 2016-07-30 ENCOUNTER — Other Ambulatory Visit: Payer: Self-pay | Admitting: *Deleted

## 2016-07-30 ENCOUNTER — Ambulatory Visit (INDEPENDENT_AMBULATORY_CARE_PROVIDER_SITE_OTHER): Payer: PPO | Admitting: Family Medicine

## 2016-07-30 ENCOUNTER — Encounter: Payer: Self-pay | Admitting: Family Medicine

## 2016-07-30 VITALS — BP 144/80 | HR 90 | Temp 97.5°F | Ht <= 58 in | Wt 116.0 lb

## 2016-07-30 DIAGNOSIS — Z0000 Encounter for general adult medical examination without abnormal findings: Secondary | ICD-10-CM | POA: Diagnosis not present

## 2016-07-30 DIAGNOSIS — E785 Hyperlipidemia, unspecified: Secondary | ICD-10-CM | POA: Diagnosis not present

## 2016-07-30 DIAGNOSIS — I10 Essential (primary) hypertension: Secondary | ICD-10-CM | POA: Diagnosis not present

## 2016-07-30 DIAGNOSIS — D649 Anemia, unspecified: Secondary | ICD-10-CM

## 2016-07-30 DIAGNOSIS — E038 Other specified hypothyroidism: Secondary | ICD-10-CM | POA: Diagnosis not present

## 2016-07-30 DIAGNOSIS — N182 Chronic kidney disease, stage 2 (mild): Secondary | ICD-10-CM

## 2016-07-30 DIAGNOSIS — Z23 Encounter for immunization: Secondary | ICD-10-CM | POA: Diagnosis not present

## 2016-07-30 LAB — CBC
HEMATOCRIT: 35.4 % — AB (ref 36.0–46.0)
HEMOGLOBIN: 12.3 g/dL (ref 12.0–15.0)
MCHC: 34.7 g/dL (ref 30.0–36.0)
MCV: 93 fl (ref 78.0–100.0)
PLATELETS: 241 10*3/uL (ref 150.0–400.0)
RBC: 3.81 Mil/uL — ABNORMAL LOW (ref 3.87–5.11)
RDW: 13.9 % (ref 11.5–15.5)
WBC: 3.7 10*3/uL — ABNORMAL LOW (ref 4.0–10.5)

## 2016-07-30 LAB — LIPID PANEL
CHOL/HDL RATIO: 3
Cholesterol: 200 mg/dL (ref 0–200)
HDL: 58.2 mg/dL (ref >39.00)
LDL CALC: 104 mg/dL — AB (ref 0–99)
NONHDL: 141.41
Triglycerides: 185 mg/dL — ABNORMAL HIGH (ref 0.0–149.0)
VLDL: 37 mg/dL (ref 0.0–40.0)

## 2016-07-30 LAB — BASIC METABOLIC PANEL
BUN: 21 mg/dL (ref 6–23)
CHLORIDE: 103 meq/L (ref 96–112)
CO2: 30 meq/L (ref 19–32)
Calcium: 10 mg/dL (ref 8.4–10.5)
Creatinine, Ser: 1.47 mg/dL — ABNORMAL HIGH (ref 0.40–1.20)
GFR: 35.66 mL/min — ABNORMAL LOW (ref >60.00)
GLUCOSE: 95 mg/dL (ref 70–99)
POTASSIUM: 4.1 meq/L (ref 3.5–5.1)
SODIUM: 142 meq/L (ref 135–145)

## 2016-07-30 LAB — TSH: TSH: 5.05 u[IU]/mL — ABNORMAL HIGH (ref 0.35–4.50)

## 2016-07-30 NOTE — Patient Instructions (Signed)
BEFORE YOU LEAVE: -follow up: 4 months -labs -flu shot  Consider chair exercises. Look on you tube as I showed you.  We have ordered labs or studies at this visit. It can take up to 1-2 weeks for results and processing. IF results require follow up or explanation, we will call you with instructions. Clinically stable results will be released to your [facility]. If you have not heard from [place] or cannot find your results in [facility] in 2 weeks please contact our office at [phone].  If you are not yet signed up for [facility], please consider signing up.  We recommend the following healthy lifestyle for LIFE: 1) Small portions.   Tip: eat off of a salad plate instead of a dinner plate.  Tip: It is ok to feel hungry after a meal - that likely means you ate an appropriate portion.  Tip: if you need more or a snack choose fruits, veggies and/or a handful of nuts or seeds.  2) Eat a healthy clean diet.  * Tip: Avoid (less then 1 serving per week): processed foods, sweets, sweetened drinks, [place] starches (rice, flour, bread, potatoes, pasta, etc), red meat, fast foods, butter  *Tip: CHOOSE instead   * 5-9 servings per day of fresh or frozen fruits and vegetables (but not corn, potatoes, bananas, canned or dried fruit)   *nuts and seeds, beans   *olives and olive oil   *small portions of lean meats such as fish and [place] chicken    *small portions of whole grains  3)Get at least 150 minutes of sweaty aerobic exercise per week.  4)Reduce stress - consider counseling, meditation and relaxation to balance other aspects of your life.

## 2016-08-07 DIAGNOSIS — H353112 Nonexudative age-related macular degeneration, right eye, intermediate dry stage: Secondary | ICD-10-CM | POA: Diagnosis not present

## 2016-08-07 DIAGNOSIS — H35371 Puckering of macula, right eye: Secondary | ICD-10-CM | POA: Diagnosis not present

## 2016-08-07 DIAGNOSIS — H34832 Tributary (branch) retinal vein occlusion, left eye, with macular edema: Secondary | ICD-10-CM | POA: Diagnosis not present

## 2016-08-07 DIAGNOSIS — H43813 Vitreous degeneration, bilateral: Secondary | ICD-10-CM | POA: Diagnosis not present

## 2016-08-08 ENCOUNTER — Other Ambulatory Visit (HOSPITAL_COMMUNITY): Payer: Self-pay | Admitting: Psychiatry

## 2016-08-14 ENCOUNTER — Ambulatory Visit (INDEPENDENT_AMBULATORY_CARE_PROVIDER_SITE_OTHER): Payer: PPO | Admitting: Family Medicine

## 2016-08-14 ENCOUNTER — Encounter: Payer: Self-pay | Admitting: Family Medicine

## 2016-08-14 VITALS — BP 127/79 | HR 73 | Temp 98.7°F | Ht <= 58 in | Wt 117.0 lb

## 2016-08-14 DIAGNOSIS — M26622 Arthralgia of left temporomandibular joint: Secondary | ICD-10-CM | POA: Diagnosis not present

## 2016-08-14 NOTE — Progress Notes (Signed)
Pre visit review using our clinic review tool, if applicable. No additional management support is needed unless otherwise documented below in the visit note. 

## 2016-08-14 NOTE — Progress Notes (Signed)
   Subjective:    Patient ID: Alison Cordova, female    DOB: 28-Feb-1926, 82 y.o.   MRN: [id number]  HPI Here for 2 days of pain in the left jaw and left ear. No headache or fever or ST. It hurts to open her mouth wide. She usually takes Tylenol for joint pains but this has not helped.    Review of Systems  Constitutional: Negative.   HENT: Negative for congestion, dental problem, facial swelling, mouth sores, sinus pressure, sore throat and trouble swallowing.   Eyes: Negative.   Respiratory: Negative.        Objective:   Physical Exam  Constitutional: She appears well-developed and well-nourished. No distress.  HENT:  Head: Normocephalic and atraumatic.  Right Ear: External ear normal.  Left Ear: External ear normal.  Nose: Nose normal.  Mouth/Throat: Oropharynx is clear and moist.  Tender over the left TMJ with very reduced ROM  Eyes: Conjunctivae are normal.  Neck: Neck supple. No thyromegaly present.  Lymphadenopathy:    She has no cervical adenopathy.          Assessment & Plan:  TMJ pain. Eat soft foods for several days. Use moist heat over the area. Try Advil 400 mg every 6 hours prn for several days.  Laurey Morale, MD

## 2016-09-04 ENCOUNTER — Other Ambulatory Visit (INDEPENDENT_AMBULATORY_CARE_PROVIDER_SITE_OTHER): Payer: PPO

## 2016-09-04 DIAGNOSIS — D649 Anemia, unspecified: Secondary | ICD-10-CM

## 2016-09-04 DIAGNOSIS — E038 Other specified hypothyroidism: Secondary | ICD-10-CM

## 2016-09-04 LAB — CBC WITH DIFFERENTIAL/PLATELET
BASOS PCT: 0.3 % (ref 0.0–3.0)
Basophils Absolute: 0 10*3/uL (ref 0.0–0.1)
EOS ABS: 0.1 10*3/uL (ref 0.0–0.7)
EOS PCT: 1 % (ref 0.0–5.0)
HCT: 31.1 % — ABNORMAL LOW (ref 36.0–46.0)
Hemoglobin: 10.6 g/dL — ABNORMAL LOW (ref 12.0–15.0)
LYMPHS ABS: 1.8 10*3/uL (ref 0.7–4.0)
Lymphocytes Relative: 32.4 % (ref 12.0–46.0)
MCHC: 34.1 g/dL (ref 30.0–36.0)
MCV: 93 fl (ref 78.0–100.0)
MONO ABS: 0.9 10*3/uL (ref 0.1–1.0)
Monocytes Relative: 16.5 % — ABNORMAL HIGH (ref 3.0–12.0)
NEUTROS PCT: 49.8 % (ref 43.0–77.0)
Neutro Abs: 2.8 10*3/uL (ref 1.4–7.7)
Platelets: 215 10*3/uL (ref 150.0–400.0)
RBC: 3.35 Mil/uL — ABNORMAL LOW (ref 3.87–5.11)
RDW: 14 % (ref 11.5–15.5)
WBC: 5.6 10*3/uL (ref 4.0–10.5)

## 2016-09-04 LAB — TSH: TSH: 2.42 u[IU]/mL (ref 0.35–4.50)

## 2016-10-05 ENCOUNTER — Other Ambulatory Visit (INDEPENDENT_AMBULATORY_CARE_PROVIDER_SITE_OTHER): Payer: PPO

## 2016-10-05 DIAGNOSIS — C50912 Malignant neoplasm of unspecified site of left female breast: Secondary | ICD-10-CM | POA: Diagnosis not present

## 2016-10-05 DIAGNOSIS — D649 Anemia, unspecified: Secondary | ICD-10-CM | POA: Diagnosis not present

## 2016-10-05 LAB — CBC WITH DIFFERENTIAL/PLATELET
BASOS PCT: 0.5 % (ref 0.0–3.0)
Basophils Absolute: 0 10*3/uL (ref 0.0–0.1)
EOS PCT: 1.4 % (ref 0.0–5.0)
Eosinophils Absolute: 0.1 10*3/uL (ref 0.0–0.7)
HEMATOCRIT: 35.4 % — AB (ref 36.0–46.0)
HEMOGLOBIN: 12.2 g/dL (ref 12.0–15.0)
LYMPHS PCT: 34.6 % (ref 12.0–46.0)
Lymphs Abs: 1.3 10*3/uL (ref 0.7–4.0)
MCHC: 34.5 g/dL (ref 30.0–36.0)
MCV: 91.5 fl (ref 78.0–100.0)
Monocytes Absolute: 0.7 10*3/uL (ref 0.1–1.0)
Monocytes Relative: 17.3 % — ABNORMAL HIGH (ref 3.0–12.0)
Neutro Abs: 1.8 10*3/uL (ref 1.4–7.7)
Neutrophils Relative %: 46.2 % (ref 43.0–77.0)
Platelets: 242 10*3/uL (ref 150.0–400.0)
RBC: 3.87 Mil/uL (ref 3.87–5.11)
RDW: 13.8 % (ref 11.5–15.5)
WBC: 3.9 10*3/uL — AB (ref 4.0–10.5)

## 2016-10-05 LAB — FERRITIN: Ferritin: 114.9 ng/mL (ref 10.0–291.0)

## 2016-10-05 LAB — VITAMIN B12: Vitamin B-12: 492 pg/mL (ref 211–911)

## 2016-10-05 LAB — FOLATE: Folate: >23.4 ng/mL (ref >5.9)

## 2016-10-12 DIAGNOSIS — F329 Major depressive disorder, single episode, unspecified: Secondary | ICD-10-CM | POA: Diagnosis not present

## 2016-10-20 ENCOUNTER — Telehealth: Payer: Self-pay | Admitting: Family Medicine

## 2016-10-20 MED ORDER — AMLODIPINE BESYLATE 2.5 MG PO TABS
2.5000 mg | ORAL_TABLET | Freq: Every day | ORAL | 3 refills | Status: DC
Start: 2016-10-20 — End: 2017-02-28

## 2016-10-20 MED ORDER — LEVOTHYROXINE SODIUM 50 MCG PO TABS: 50.0000 ug | ORAL_TABLET | Freq: Every day | ORAL | 3 refills | Status: DC

## 2016-10-20 MED ORDER — FAMOTIDINE 20 MG PO TABS: 20.0000 mg | ORAL_TABLET | Freq: Two times a day (BID) | ORAL | 3 refills | Status: DC | PRN

## 2016-10-20 MED ORDER — POTASSIUM CHLORIDE CRYS ER 20 MEQ PO TBCR
20.0000 meq | EXTENDED_RELEASE_TABLET | Freq: Every day | ORAL | 3 refills | Status: DC
Start: 2016-10-20 — End: 2018-08-15

## 2016-10-20 MED ORDER — LISINOPRIL 20 MG PO TABS: 20.0000 mg | ORAL_TABLET | Freq: Every day | ORAL | 3 refills | Status: DC

## 2016-10-20 MED ORDER — ATENOLOL-CHLORTHALIDONE 50-25 MG PO TABS: 1.0000 | ORAL_TABLET | Freq: Every day | ORAL | 3 refills | Status: DC

## 2016-10-20 MED ORDER — SIMVASTATIN 20 MG PO TABS: 20.0000 mg | ORAL_TABLET | Freq: Every day | ORAL | 3 refills | Status: DC

## 2016-10-20 NOTE — Telephone Encounter (Signed)
Pt request refill (s)  potassium chloride SA (K-DUR,KLOR-CON) 20 MEQ tablet famotidine (PEPCID) 20 MG tablet nortriptyline (PAMELOR) 10 MG capsule fluvoxaMINE (LUVOX) 100 MG tablet lisinopril (PRINIVIL,ZESTRIL) 20 MG tablet simvastatin (ZOCOR) 20 MG tablet levothyroxine (SYNTHROID, LEVOTHROID) 50 MCG tablet atenolol-chlorthalidone (TENORETIC) 50-25 MG per tablet amLODipine (NORVASC) 2.5 MG tablet  90 day supply each  [facility] [employer] [facility]

## 2016-10-20 NOTE — Telephone Encounter (Signed)
I called the pt and informed her of the message below and she stated her psychiatrist did send in refills of these medications for her.

## 2016-10-20 NOTE — Telephone Encounter (Signed)
All Rxs sent to the pts pharmacy except the following: Nortriptyline and Fluvoxamine which were sent to Dr Maudie Mercury for approval.

## 2016-10-20 NOTE — Telephone Encounter (Signed)
I believe she sees her  psychiatrist for those medications?

## 2016-11-06 DIAGNOSIS — H35371 Puckering of macula, right eye: Secondary | ICD-10-CM | POA: Diagnosis not present

## 2016-11-06 DIAGNOSIS — H34832 Tributary (branch) retinal vein occlusion, left eye, with macular edema: Secondary | ICD-10-CM | POA: Diagnosis not present

## 2016-11-06 DIAGNOSIS — H43813 Vitreous degeneration, bilateral: Secondary | ICD-10-CM | POA: Diagnosis not present

## 2016-11-06 DIAGNOSIS — H353112 Nonexudative age-related macular degeneration, right eye, intermediate dry stage: Secondary | ICD-10-CM | POA: Diagnosis not present

## 2016-11-10 DIAGNOSIS — N3944 Nocturnal enuresis: Secondary | ICD-10-CM | POA: Diagnosis not present

## 2016-11-10 DIAGNOSIS — R350 Frequency of micturition: Secondary | ICD-10-CM | POA: Diagnosis not present

## 2016-11-29 NOTE — Progress Notes (Signed)
HPI:  Alison Cordova is a very pleasant 83 yo with a PMH HTN, CKD, CVA, HLD, Hypothyroidism, Depression, Anxiety, Urinary incontinence, breast cancer and GERD here for follow up. She sees her psychiatrist for management of her psychiatric medications. She would like for [place] to take over her psychiatric medications as has been very stable for many years per report. Denies SI, thoughts of self harm, depression, panic.  Occ aches in R foot and L wrist. Tylenol or nsaids helps. She does no want imaging or further eval of this.    She is due for a mammogram but she has declined further mammograms.  ROS: See pertinent positives and negatives per HPI.  Past Medical History:  Diagnosis Date  . Allergic rhinitis   . Anemia 06/17/2012  . Anxiety and depression   . Cataract 06/17/2012  . Chronic kidney disease   . CVA (cerebral vascular accident) ([place])   . Diverticulosis 03/14/2008   Qualifier: Diagnosis of  By: Lenna Gilford MD, Deborra Medina   . Glaucoma   . Hemorrhoids 07/03/2011  . Hx of breast cancer    Left  . Hyperlipemia   . Hyperlipidemia   . Hypertension   . Hypothyroid   . Irritable bowel syndrome 09/17/2007   Qualifier: Diagnosis of  By: Rosana Hoes CMA, Tammy    . Lung nodule 07/06/2011  . Peripheral neuropathy ([place])   . Urinary incontinence     Past Surgical History:  Procedure Laterality Date  .  FUNDIPLICATION    . BREAST SURGERY     left breast mastectomy   . EYE SURGERY     TO DESOLVE AN AREA IN HER LEFT EYE?  . SPINE SURGERY    . VAGINAL HYSTERECTOMY      No family history on file.  Social History   Social History  . Marital status: Widowed    Spouse name: N/A  . Number of children: N/A  . Years of education: N/A   Social History Main Topics  . Smoking status: Former Smoker    Types: Cigarettes    Quit date: 11/23/1962  . Smokeless tobacco: Never Used  . Alcohol use Yes     Comment: PT STATES DRINKS A BEER OR WINE OCCASSIONALLY, NOT WEEKLY  . Drug use: No  . Sexual activity:  Not Asked   Other Topics Concern  . None   Social History Narrative   Work or School: none      Home Situation: lives with daughter and son and law      Spiritual Beliefs: [employer]      Lifestyle: no regular exercise; diet is ok              Current Outpatient Prescriptions:  .  acetaminophen (TYLENOL) 650 MG CR tablet, Take 650 mg by mouth every 8 (eight) hours as needed.  , Disp: , Rfl:  .  amLODipine (NORVASC) 2.5 MG tablet, Take 1 tablet (2.5 mg total) by mouth daily., Disp: 90 tablet, Rfl: 3 .  aspirin 325 MG tablet, Take 325 mg by mouth daily.  , Disp: , Rfl:  .  atenolol-chlorthalidone (TENORETIC) 50-25 MG tablet, Take 1 tablet by mouth daily., Disp: 90 tablet, Rfl: 3 .  cetirizine (ZYRTEC) 10 MG tablet, Take 1 tablet (10 mg total) by mouth daily., Disp: 90 tablet, Rfl: 3 .  famotidine (PEPCID) 20 MG tablet, Take 1 tablet (20 mg total) by mouth 2 (two) times daily as needed., Disp: 180 tablet, Rfl: 3 .  fesoterodine (TOVIAZ)  4 MG TB24 tablet, Take 1 tablet (4 mg total) by mouth daily., Disp: 90 tablet, Rfl: 3 .  fish oil-omega-3 fatty acids 1000 MG capsule, Take 2 g by mouth daily.  , Disp: , Rfl:  .  fluocinonide cream (LIDEX) 0.05 %, Apply topically 2 (two) times daily., Disp: 90 g, Rfl: 3 .  fluticasone (FLONASE) 50 MCG/ACT nasal spray, Place 2 sprays into both nostrils daily., Disp: 16 g, Rfl: 3 .  fluvoxaMINE (LUVOX) 100 MG tablet, Take as directed by Dr. Abner Greenspan, Disp: , Rfl:  .  latanoprost (XALATAN) 0.005 % ophthalmic solution, Place 1 drop into both eyes at bedtime.  , Disp: , Rfl:  .  levothyroxine (SYNTHROID, LEVOTHROID) 50 MCG tablet, Take 1 tablet (50 mcg total) by mouth daily., Disp: 90 tablet, Rfl: 3 .  lisinopril (PRINIVIL,ZESTRIL) 20 MG tablet, Take 1 tablet (20 mg total) by mouth daily., Disp: 90 tablet, Rfl: 3 .  mirabegron ER (MYRBETRIQ) 50 MG TB24 tablet, Take 50 mg by mouth every other day., Disp: , Rfl:  .  Multiple Vitamins-Minerals (WOMENS  MULTIVITAMIN PLUS PO), Take 1 tablet by mouth daily.  , Disp: , Rfl:  .  NON FORMULARY, Post Surgical Bras, Disp: , Rfl:  .  NON FORMULARY, Non-Silicone Breast Prosthesis, Disp: , Rfl:  .  NON FORMULARY, Silicone Breast Prosthesis, Disp: , Rfl:  .  nortriptyline (PAMELOR) 10 MG capsule, Take 1 capsule (10 mg total) by mouth at bedtime., Disp: 90 capsule, Rfl: 3 .  potassium chloride SA (K-DUR,KLOR-CON) 20 MEQ tablet, Take 1 tablet (20 mEq total) by mouth daily., Disp: 90 tablet, Rfl: 3 .  simvastatin (ZOCOR) 20 MG tablet, Take 1 tablet (20 mg total) by mouth at bedtime., Disp: 90 tablet, Rfl: 3 .  timolol (BETIMOL) 0.5 % ophthalmic solution, Place 1 drop into both eyes 2 (two) times daily., Disp: , Rfl:  .  tobramycin (TOBREX) 0.3 % ophthalmic ointment, Place 1 application into the left eye as needed., Disp: , Rfl:  .  UNABLE TO FIND, 174.9 Left mastectomy   L8030-Silicone Breast Prosthesis-1, Disp: 1 each, Rfl: 0  EXAM:  Vitals:   11/30/16 0835  BP: 128/74  Pulse: 91  Temp: 97.5 F (36.4 C)    Body mass index is 24.64 kg/m.  GENERAL: vitals reviewed and listed above, alert, oriented, appears well hydrated and in no acute distress  HEENT: atraumatic, conjunttiva clear, no obvious abnormalities on inspection of external nose and ears  NECK: no obvious masses on inspection  LUNGS: clear to auscultation bilaterally, no wheezes, rales or rhonchi, good air movement  CV: HRRR, no peripheral edema  MS: moves all extremities without noticeable abnormality, normal inspection of feet other then likely OA. Minimal swelling L wrist with some mild TTP around the L wrist. NV intact distal.  PSYCH: pleasant and cooperative, no obvious depression or anxiety  ASSESSMENT AND PLAN:  Discussed the following assessment and plan:  Essential hypertension  HYPERCHOLESTEROLEMIA  Other specified hypothyroidism  Cerebrovascular disease  Chronic kidney disease (CKD), stage II  (mild)  Recurrent major depressive disorder, in full remission (HCC)  Arthralgia of left wrist  -discussed causes arthralgia and offered eval with labs imaging - she declined saying "im 89, it is fine" -symptomatic care discussed -we will take over her psych med -labs next visit -declined further mammograms as reports would not want any treatment if ca found - will let us know if changes he mind -Patient advised to return or notify a doctor immediately  if symptoms worsen or persist or new concerns arise.  Patient Instructions  BEFORE YOU LEAVE: -follow up: 3 months  Can try topical capsaicin sports creams for wrist and foot pain.  Tylenol per instructions is ok for pain. Can use aleve for pain on bad days per instructions.  Please continue to get gentle activity - chair exercises and the exercise bike are good choices.    Colin Benton R., DO

## 2016-11-30 ENCOUNTER — Ambulatory Visit (INDEPENDENT_AMBULATORY_CARE_PROVIDER_SITE_OTHER): Payer: PPO | Admitting: Family Medicine

## 2016-11-30 ENCOUNTER — Encounter: Payer: Self-pay | Admitting: Family Medicine

## 2016-11-30 VITALS — BP 128/74 | HR 91 | Temp 97.5°F | Ht <= 58 in | Wt 117.9 lb

## 2016-11-30 DIAGNOSIS — E038 Other specified hypothyroidism: Secondary | ICD-10-CM | POA: Diagnosis not present

## 2016-11-30 DIAGNOSIS — I10 Essential (primary) hypertension: Secondary | ICD-10-CM

## 2016-11-30 DIAGNOSIS — F3342 Major depressive disorder, recurrent, in full remission: Secondary | ICD-10-CM

## 2016-11-30 DIAGNOSIS — M25532 Pain in left wrist: Secondary | ICD-10-CM

## 2016-11-30 DIAGNOSIS — N182 Chronic kidney disease, stage 2 (mild): Secondary | ICD-10-CM

## 2016-11-30 DIAGNOSIS — E7800 Pure hypercholesterolemia, unspecified: Secondary | ICD-10-CM | POA: Diagnosis not present

## 2016-11-30 DIAGNOSIS — I679 Cerebrovascular disease, unspecified: Secondary | ICD-10-CM | POA: Diagnosis not present

## 2016-11-30 NOTE — Patient Instructions (Signed)
BEFORE YOU LEAVE: -follow up: 3 months  Can try topical capsaicin sports creams for wrist and foot pain.  Tylenol per instructions is ok for pain. Can use aleve for pain on bad days per instructions.  Please continue to get gentle activity - chair exercises and the exercise bike are good choices.

## 2016-11-30 NOTE — Progress Notes (Signed)
Pre visit review using our clinic review tool, if applicable. No additional management support is needed unless otherwise documented below in the visit note. 

## 2016-12-14 ENCOUNTER — Telehealth: Payer: Self-pay | Admitting: Family Medicine

## 2016-12-14 NOTE — Telephone Encounter (Signed)
Pt need new Rx for fluvoxalmine 100 MG  Pharm:  [facility]  [phone] ([phone])

## 2016-12-14 NOTE — Telephone Encounter (Signed)
Ok to refill 90 days + 1 refill  

## 2016-12-15 MED ORDER — FLUVOXAMINE MALEATE 100 MG PO TABS: 100.0000 mg | ORAL_TABLET | Freq: Every day | ORAL | 1 refills | Status: DC

## 2016-12-15 NOTE — Telephone Encounter (Signed)
Rx done. 

## 2017-01-08 ENCOUNTER — Other Ambulatory Visit: Payer: Self-pay | Admitting: Family Medicine

## 2017-01-22 DIAGNOSIS — H34832 Tributary (branch) retinal vein occlusion, left eye, with macular edema: Secondary | ICD-10-CM | POA: Diagnosis not present

## 2017-01-22 DIAGNOSIS — H353112 Nonexudative age-related macular degeneration, right eye, intermediate dry stage: Secondary | ICD-10-CM | POA: Diagnosis not present

## 2017-01-22 DIAGNOSIS — H35371 Puckering of macula, right eye: Secondary | ICD-10-CM | POA: Diagnosis not present

## 2017-01-22 DIAGNOSIS — H348312 Tributary (branch) retinal vein occlusion, right eye, stable: Secondary | ICD-10-CM | POA: Diagnosis not present

## 2017-02-28 NOTE — Progress Notes (Signed)
HPI:  AWV 07/2016 Alison Cordova is a pleasant 83 y.o. here for follow up. Chronic medical problems summarized below were reviewed for changes and stability and were updated as needed below. These issues and their treatment remain stable for the most part. She reports that she feels very good. She has a skin lesion on the L shoulder that gets caught on bra strap occasionally. Denies CP, SOB, DOE, treatment intolerance or new symptoms. Mood is great per her report. Due for labs.  HTN/hx CVA/CKD: -meds:amlodipine, asa, atenolol-chlorthalidone, lisinopril  Hypothyroid:  -med: levothyroxine  HLD/Obesity: -on statin  OAB: -meds: toviaz, myrbetriq  Anxiety and Depression: -saw psychiatrist and was stable on the same medications for many many years and now no longer sees psychiatrist -meds: nortriptyline, fluvoxamine  GERD: -meds: pepcid  Hx breast Ca: -with breast prosthesis   ROS: See pertinent positives and negatives per HPI.  Past Medical History:  Diagnosis Date  . Allergic rhinitis   . Anemia 06/17/2012  . Anxiety and depression   . Cataract 06/17/2012  . Chronic kidney disease   . CVA (cerebral vascular accident) ([place])   . Diverticulosis 03/14/2008   Qualifier: Diagnosis of  By: Lenna Gilford MD, Deborra Medina   . Glaucoma   . Hemorrhoids 07/03/2011  . Hx of breast cancer    Left  . Hyperlipemia   . Hyperlipidemia   . Hypertension   . Hypothyroid   . Irritable bowel syndrome 09/17/2007   Qualifier: Diagnosis of  By: Rosana Hoes CMA, Tammy    . Lung nodule 07/06/2011  . Peripheral neuropathy ([place])   . Urinary incontinence     Past Surgical History:  Procedure Laterality Date  .  FUNDIPLICATION    . BREAST SURGERY     left breast mastectomy   . EYE SURGERY     TO DESOLVE AN AREA IN HER LEFT EYE?  . SPINE SURGERY    . VAGINAL HYSTERECTOMY      No family history on file.  Social History   Social History  . Marital status: Widowed    Spouse name: N/A  . Number of children:  N/A  . Years of education: N/A   Social History Main Topics  . Smoking status: Former Smoker    Types: Cigarettes    Quit date: 11/23/1962  . Smokeless tobacco: Never Used  . Alcohol use Yes     Comment: PT STATES DRINKS A BEER OR WINE OCCASSIONALLY, NOT WEEKLY  . Drug use: No  . Sexual activity: Not Asked   Other Topics Concern  . None   Social History Narrative   Work or School: none      Home Situation: lives with daughter and son and law      Spiritual Beliefs: [employer]      Lifestyle: no regular exercise; diet is ok              Current Outpatient Prescriptions:  .  acetaminophen (TYLENOL) 650 MG CR tablet, Take 650 mg by mouth every 8 (eight) hours as needed.  , Disp: , Rfl:  .  amLODipine (NORVASC) 2.5 MG tablet, TAKE 1 TABLET BY MOUTH EVERY DAY, Disp: 90 tablet, Rfl: 2 .  aspirin 325 MG tablet, Take 325 mg by mouth daily.  , Disp: , Rfl:  .  atenolol-chlorthalidone (TENORETIC) 50-25 MG tablet, Take 1 tablet by mouth daily., Disp: 90 tablet, Rfl: 3 .  cetirizine (ZYRTEC) 10 MG tablet, Take 1 tablet (10 mg total) by mouth daily., Disp:  90 tablet, Rfl: 3 .  famotidine (PEPCID) 20 MG tablet, Take 1 tablet (20 mg total) by mouth 2 (two) times daily as needed., Disp: 180 tablet, Rfl: 3 .  fesoterodine (TOVIAZ) 4 MG TB24 tablet, Take 1 tablet (4 mg total) by mouth daily., Disp: 90 tablet, Rfl: 3 .  fish oil-omega-3 fatty acids 1000 MG capsule, Take 2 g by mouth daily.  , Disp: , Rfl:  .  fluocinonide cream (LIDEX) 0.05 %, Apply topically 2 (two) times daily., Disp: 90 g, Rfl: 3 .  fluticasone (FLONASE) 50 MCG/ACT nasal spray, Place 2 sprays into both nostrils daily., Disp: 16 g, Rfl: 3 .  fluvoxaMINE (LUVOX) 100 MG tablet, Take 1 tablet (100 mg total) by mouth daily., Disp: 90 tablet, Rfl: 1 .  latanoprost (XALATAN) 0.005 % ophthalmic solution, Place 1 drop into both eyes at bedtime.  , Disp: , Rfl:  .  levothyroxine (SYNTHROID, LEVOTHROID) 50 MCG tablet, Take 1  tablet (50 mcg total) by mouth daily., Disp: 90 tablet, Rfl: 3 .  lisinopril (PRINIVIL,ZESTRIL) 20 MG tablet, Take 1 tablet (20 mg total) by mouth daily., Disp: 90 tablet, Rfl: 3 .  mirabegron ER (MYRBETRIQ) 50 MG TB24 tablet, Take 50 mg by mouth every other day., Disp: , Rfl:  .  Multiple Vitamins-Minerals (WOMENS MULTIVITAMIN PLUS PO), Take 1 tablet by mouth daily.  , Disp: , Rfl:  .  NON FORMULARY, Post Surgical Bras, Disp: , Rfl:  .  NON FORMULARY, Non-Silicone Breast Prosthesis, Disp: , Rfl:  .  NON FORMULARY, Silicone Breast Prosthesis, Disp: , Rfl:  .  nortriptyline (PAMELOR) 10 MG capsule, Take 1 capsule (10 mg total) by mouth at bedtime., Disp: 90 capsule, Rfl: 3 .  potassium chloride SA (K-DUR,KLOR-CON) 20 MEQ tablet, Take 1 tablet (20 mEq total) by mouth daily., Disp: 90 tablet, Rfl: 3 .  simvastatin (ZOCOR) 20 MG tablet, Take 1 tablet (20 mg total) by mouth at bedtime., Disp: 90 tablet, Rfl: 3 .  timolol (BETIMOL) 0.5 % ophthalmic solution, Place 1 drop into both eyes 2 (two) times daily., Disp: , Rfl:  .  tobramycin (TOBREX) 0.3 % ophthalmic ointment, Place 1 application into the left eye as needed., Disp: , Rfl:  .  UNABLE TO FIND, 174.9 Left mastectomy   L8030-Silicone Breast Prosthesis-1, Disp: 1 each, Rfl: 0  EXAM:  Vitals:   03/01/17 0902  BP: 132/80  Pulse: (!) 44  Temp: 97.4 F (36.3 C)    Body mass index is 23.68 kg/m.  GENERAL: vitals reviewed and listed above, alert, oriented, appears well hydrated and in no acute distress  HEENT: atraumatic, conjunttiva clear, no obvious abnormalities on inspection of external nose and ears  NECK: no obvious masses on inspection  LUNGS: clear to auscultation bilaterally, no wheezes, rales or rhonchi, good air movement  CV: HRRR, no peripheral edema  SKIN: brown waxy stuck on lesion L upper shoulder region  MS: moves all extremities without noticeable abnormality  PSYCH: pleasant and cooperative, no obvious depression  or anxiety  ASSESSMENT AND PLAN:  Discussed the following assessment and plan:  Essential hypertension - Plan: Basic metabolic panel, CBC  Chronic kidney disease (CKD), stage II (mild)  Hyperlipidemia, unspecified hyperlipidemia type  Other specified hypothyroidism - Plan: TSH  Urinary incontinence, unspecified type  Gastroesophageal reflux disease, esophagitis presence not specified  Recurrent major depressive disorder, in full remission ([place])  Anxiety  -looks like SK skin, offered removal for biopsy - declined -labs -lifestyle recs -follow up 3-4  months -Patient advised to return or notify a doctor immediately if symptoms worsen or persist or new concerns arise.  Patient Instructions  BEFORE YOU LEAVE: -phq9 -follow up: 3 months -labs  We have ordered labs or studies at this visit. It can take up to 1-2 weeks for results and processing. IF results require follow up or explanation, we will call you with instructions. Clinically stable results will be released to your [facility]. If you have not heard from [place] or cannot find your results in [facility] in 2 weeks please contact our office at [phone].  If you are not yet signed up for [facility], please consider signing up.  We recommend the following healthy lifestyle for LIFE: 1) Small portions.   Tip: eat off of a salad plate instead of a dinner plate.  Tip: if you need more or a snack choose fruits, veggies and/or a handful of nuts or seeds.  2) Eat a healthy clean diet.  * Tip: Avoid (less then 1 serving per week): processed foods, sweets, sweetened drinks, [place] starches (rice, flour, bread, potatoes, pasta, etc), red meat, fast foods, butter  *Tip: CHOOSE instead   * 5-9 servings per day of fresh or frozen fruits and vegetables (but not corn, potatoes, bananas, canned or dried fruit)   *nuts and seeds, beans   *olives and olive oil   *small portions of lean meats such as fish and [place] chicken    *small portions of  whole grains  3)Get at least 150 minutes of sweaty aerobic exercise per week.  4)Reduce stress - consider counseling, meditation and relaxation to balance other aspects of your life.  WE NOW OFFER   [facility] FAST TRACK!!!  SAME DAY Appointments for ACUTE CARE  Such as: Sprains, Injuries, cuts, abrasions, rashes, muscle pain, joint pain, back pain Colds, flu, sore throats, headache, allergies, cough, fever  Ear pain, sinus and eye infections Abdominal pain, nausea, vomiting, diarrhea, upset stomach Animal/insect bites  3 Easy Ways to Schedule: Walk-In Scheduling Call in scheduling Mychart Sign-up: [email or web address]              Colin Benton R., DO

## 2017-03-01 ENCOUNTER — Ambulatory Visit (INDEPENDENT_AMBULATORY_CARE_PROVIDER_SITE_OTHER): Payer: PPO | Admitting: Family Medicine

## 2017-03-01 ENCOUNTER — Encounter: Payer: Self-pay | Admitting: Family Medicine

## 2017-03-01 VITALS — BP 132/80 | HR 44 | Temp 97.4°F | Ht <= 58 in | Wt 113.3 lb

## 2017-03-01 DIAGNOSIS — R32 Unspecified urinary incontinence: Secondary | ICD-10-CM | POA: Diagnosis not present

## 2017-03-01 DIAGNOSIS — F3342 Major depressive disorder, recurrent, in full remission: Secondary | ICD-10-CM | POA: Diagnosis not present

## 2017-03-01 DIAGNOSIS — N182 Chronic kidney disease, stage 2 (mild): Secondary | ICD-10-CM

## 2017-03-01 DIAGNOSIS — F419 Anxiety disorder, unspecified: Secondary | ICD-10-CM | POA: Diagnosis not present

## 2017-03-01 DIAGNOSIS — I10 Essential (primary) hypertension: Secondary | ICD-10-CM | POA: Diagnosis not present

## 2017-03-01 DIAGNOSIS — E785 Hyperlipidemia, unspecified: Secondary | ICD-10-CM | POA: Diagnosis not present

## 2017-03-01 DIAGNOSIS — E038 Other specified hypothyroidism: Secondary | ICD-10-CM | POA: Diagnosis not present

## 2017-03-01 DIAGNOSIS — K219 Gastro-esophageal reflux disease without esophagitis: Secondary | ICD-10-CM

## 2017-03-01 LAB — CBC
HCT: 38.3 % (ref 36.0–46.0)
Hemoglobin: 13.2 g/dL (ref 12.0–15.0)
MCHC: 34.4 g/dL (ref 30.0–36.0)
MCV: 89.2 fl (ref 78.0–100.0)
Platelets: 312 10*3/uL (ref 150.0–400.0)
RBC: 4.3 Mil/uL (ref 3.87–5.11)
RDW: 14.6 % (ref 11.5–15.5)
WBC: 4.8 10*3/uL (ref 4.0–10.5)

## 2017-03-01 LAB — BASIC METABOLIC PANEL
BUN: 23 mg/dL (ref 6–23)
CALCIUM: 10.4 mg/dL (ref 8.4–10.5)
CO2: 28 mEq/L (ref 19–32)
CREATININE: 1.41 mg/dL — AB (ref 0.40–1.20)
Chloride: 101 mEq/L (ref 96–112)
GFR: 37.37 mL/min — AB (ref >60.00)
GLUCOSE: 100 mg/dL — AB (ref 70–99)
POTASSIUM: 4.1 meq/L (ref 3.5–5.1)
Sodium: 141 mEq/L (ref 135–145)

## 2017-03-01 LAB — TSH: TSH: 5.11 u[IU]/mL — ABNORMAL HIGH (ref 0.35–4.50)

## 2017-03-01 NOTE — Progress Notes (Signed)
Pre visit review using our clinic review tool, if applicable. No additional management support is needed unless otherwise documented below in the visit note. 

## 2017-03-01 NOTE — Patient Instructions (Signed)
BEFORE YOU LEAVE: -phq9 -follow up: 3 months -labs  We have ordered labs or studies at this visit. It can take up to 1-2 weeks for results and processing. IF results require follow up or explanation, we will call you with instructions. Clinically stable results will be released to your [facility]. If you have not heard from [place] or cannot find your results in [facility] in 2 weeks please contact our office at [phone].  If you are not yet signed up for [facility], please consider signing up.  We recommend the following healthy lifestyle for LIFE: 1) Small portions.   Tip: eat off of a salad plate instead of a dinner plate.  Tip: if you need more or a snack choose fruits, veggies and/or a handful of nuts or seeds.  2) Eat a healthy clean diet.  * Tip: Avoid (less then 1 serving per week): processed foods, sweets, sweetened drinks, [place] starches (rice, flour, bread, potatoes, pasta, etc), red meat, fast foods, butter  *Tip: CHOOSE instead   * 5-9 servings per day of fresh or frozen fruits and vegetables (but not corn, potatoes, bananas, canned or dried fruit)   *nuts and seeds, beans   *olives and olive oil   *small portions of lean meats such as fish and [place] chicken    *small portions of whole grains  3)Get at least 150 minutes of sweaty aerobic exercise per week.  4)Reduce stress - consider counseling, meditation and relaxation to balance other aspects of your life.  WE NOW OFFER   [facility] FAST TRACK!!!  SAME DAY Appointments for ACUTE CARE  Such as: Sprains, Injuries, cuts, abrasions, rashes, muscle pain, joint pain, back pain Colds, flu, sore throats, headache, allergies, cough, fever  Ear pain, sinus and eye infections Abdominal pain, nausea, vomiting, diarrhea, upset stomach Animal/insect bites  3 Easy Ways to Schedule: Walk-In Scheduling Call in scheduling Mychart Sign-up: [email or web address]

## 2017-03-12 DIAGNOSIS — N3944 Nocturnal enuresis: Secondary | ICD-10-CM | POA: Diagnosis not present

## 2017-03-12 DIAGNOSIS — R350 Frequency of micturition: Secondary | ICD-10-CM | POA: Diagnosis not present

## 2017-04-09 DIAGNOSIS — H353132 Nonexudative age-related macular degeneration, bilateral, intermediate dry stage: Secondary | ICD-10-CM | POA: Diagnosis not present

## 2017-04-09 DIAGNOSIS — H34832 Tributary (branch) retinal vein occlusion, left eye, with macular edema: Secondary | ICD-10-CM | POA: Diagnosis not present

## 2017-04-09 DIAGNOSIS — H3582 Retinal ischemia: Secondary | ICD-10-CM | POA: Diagnosis not present

## 2017-04-09 DIAGNOSIS — H348312 Tributary (branch) retinal vein occlusion, right eye, stable: Secondary | ICD-10-CM | POA: Diagnosis not present

## 2017-06-01 ENCOUNTER — Ambulatory Visit (INDEPENDENT_AMBULATORY_CARE_PROVIDER_SITE_OTHER): Payer: PPO | Admitting: Family Medicine

## 2017-06-01 ENCOUNTER — Encounter: Payer: Self-pay | Admitting: Family Medicine

## 2017-06-01 VITALS — BP 200/82 | HR 98 | Temp 98.7°F | Wt 113.1 lb

## 2017-06-01 DIAGNOSIS — M545 Low back pain, unspecified: Secondary | ICD-10-CM

## 2017-06-01 DIAGNOSIS — I10 Essential (primary) hypertension: Secondary | ICD-10-CM

## 2017-06-01 NOTE — Patient Instructions (Signed)
-  get back on all of your blood pressure medications daily -make sure you follow up with Dr Maudie Mercury next week -avoid regular use of Advil -may try Tylenol as needed for back pain. -May try heating pad as needed

## 2017-06-01 NOTE — Progress Notes (Signed)
Subjective:     Patient ID: Alison Cordova, female   DOB: 1926-11-02, 83 y.o.   MRN: [id number]  HPI Patient seen with right lumbar back pain. Onset about a week ago. Denies any injury. Patient somewhat of a poor historian.  without radiculopathy symptoms. Pain sometimes 8 out of 10 severity. No recent falls. No dysuria. Remote history of breast cancer age 87. No clear exacerbating factors. Denies any associated weakness or numbness. No fevers or chills. Appetite and weight are stable.  No chronic back difficulties.  She's been taking some Advil with some relief. No history of chronic back difficulties.  Patient has history of hypertension and apparently recent poor compliance with medications. She is supposed to be on 3 drug regimen of amlodipine, lisinopril, and Tenoretic. Not clear she is taking any of these over the past several weeks.  Past Medical History:  Diagnosis Date  . Allergic rhinitis   . Anemia 06/17/2012  . Anxiety and depression   . Cataract 06/17/2012  . Chronic kidney disease   . CVA (cerebral vascular accident) ([place])   . Diverticulosis 03/14/2008   Qualifier: Diagnosis of  By: Lenna Gilford MD, Deborra Medina   . Glaucoma   . Hemorrhoids 07/03/2011  . Hx of breast cancer    Left  . Hyperlipemia   . Hyperlipidemia   . Hypertension   . Hypothyroid   . Irritable bowel syndrome 09/17/2007   Qualifier: Diagnosis of  By: Rosana Hoes CMA, Tammy    . Lung nodule 07/06/2011  . Peripheral neuropathy   . Urinary incontinence    Past Surgical History:  Procedure Laterality Date  .  FUNDIPLICATION    . BREAST SURGERY     left breast mastectomy   . EYE SURGERY     TO DESOLVE AN AREA IN HER LEFT EYE?  . SPINE SURGERY    . VAGINAL HYSTERECTOMY      reports that she quit smoking about 54 years ago. Her smoking use included Cigarettes. She has never used smokeless tobacco. She reports that she drinks alcohol. She reports that she does not use drugs. family history is not on file. No Known  Allergies   Review of Systems  Constitutional: Negative for fatigue and unexpected weight change.  Eyes: Negative for visual disturbance.  Respiratory: Negative for cough, chest tightness, shortness of breath and wheezing.   Cardiovascular: Negative for chest pain, palpitations and leg swelling.  Genitourinary: Negative for dysuria.  Musculoskeletal: Positive for back pain.  Neurological: Negative for dizziness, seizures, syncope, weakness, light-headedness and headaches.       Objective:   Physical Exam  Constitutional: She appears well-developed and well-nourished.  Eyes: Pupils are equal, round, and reactive to light.  Neck: Neck supple. No JVD present. No thyromegaly present.  Cardiovascular: Normal rate and regular rhythm.  Exam reveals no gallop.   Pulmonary/Chest: Effort normal and breath sounds normal. No respiratory distress. She has no wheezes. She has no rales.  Musculoskeletal: She exhibits no edema.  Straight leg raises are negative  Neurological: She is alert.  Full-strength with plantar flexion, dorsiflexion, and knee extension Symmetric reflexes ankle and knee and normal sensory function throughout       Assessment:     #1 right lumbar back pain. This is acute without sciatica and with nonfocal neuro exam  #2 hypertension poorly controlled with history of poor compliance recently    Plan:     -We recommended family supervision of her medications and get a pillbox and use  daily.  Will get back on her usual BP medications. -She has follow-up already scheduled with primary next week -Avoid regular use of Advil -May try Tylenol as needed -Avoid muscle relaxers given her age -Consider topical heat -Given list of things to watch for such as progressive weakness, fever, numbness, progressive pain  Eulas Post MD [facility]

## 2017-06-07 ENCOUNTER — Ambulatory Visit (INDEPENDENT_AMBULATORY_CARE_PROVIDER_SITE_OTHER): Payer: PPO | Admitting: Family Medicine

## 2017-06-07 ENCOUNTER — Encounter: Payer: Self-pay | Admitting: Family Medicine

## 2017-06-07 VITALS — BP 124/78 | HR 63 | Temp 97.9°F | Ht <= 58 in | Wt 112.4 lb

## 2017-06-07 DIAGNOSIS — E038 Other specified hypothyroidism: Secondary | ICD-10-CM | POA: Diagnosis not present

## 2017-06-07 DIAGNOSIS — F3342 Major depressive disorder, recurrent, in full remission: Secondary | ICD-10-CM

## 2017-06-07 DIAGNOSIS — M545 Low back pain, unspecified: Secondary | ICD-10-CM

## 2017-06-07 DIAGNOSIS — N189 Chronic kidney disease, unspecified: Secondary | ICD-10-CM | POA: Diagnosis not present

## 2017-06-07 DIAGNOSIS — I10 Essential (primary) hypertension: Secondary | ICD-10-CM | POA: Diagnosis not present

## 2017-06-07 LAB — BASIC METABOLIC PANEL
BUN: 29 mg/dL — ABNORMAL HIGH (ref 6–23)
CALCIUM: 10.4 mg/dL (ref 8.4–10.5)
CO2: 28 meq/L (ref 19–32)
CREATININE: 1.58 mg/dL — AB (ref 0.40–1.20)
Chloride: 98 mEq/L (ref 96–112)
GFR: 32.75 mL/min — ABNORMAL LOW (ref >60.00)
Glucose, Bld: 113 mg/dL — ABNORMAL HIGH (ref 70–99)
Potassium: 4.4 mEq/L (ref 3.5–5.1)
Sodium: 137 mEq/L (ref 135–145)

## 2017-06-07 LAB — TSH: TSH: 1.89 u[IU]/mL (ref 0.35–4.50)

## 2017-06-07 NOTE — Progress Notes (Signed)
HPI:  Alison Cordova is a pleasant 83 y.o. here for follow up. Chronic medical problems summarized below were reviewed for changes. She saw my colleague last week for some back pain. Daughter reports is doing much better - took aleve once. Now rarely complains of any pain. No weakness or numbness or other symptoms. Daughter found out she had stopped all of her BP medications for one month. Now back on. Also pt thought she should take 30mg  nortriptyline instead of 10mg . Daughter noted this was not how it was prescribed. No depression currently. Denies CP, SOB, DOE, treatment intolerance or new symptoms. Due for labs, thyroid lab a little elevated last check.  HTN/hx CVA/CKD: -meds:amlodipine, asa, atenolol-chlorthalidone, lisinopril  Hypothyroid:  -med: levothyroxine  HLD/Obesity: -on statin  OAB: -meds: toviaz, myrbetriq  Anxiety and Depression: -saw psychiatrist and was stable on the same medications for many many years and now no longer sees psychiatrist -meds: nortriptyline, fluvoxamine  GERD: -meds: pepcid  Hx breast Ca: -with breast prosthesis   ROS: See pertinent positives and negatives per HPI.  Past Medical History:  Diagnosis Date  . Allergic rhinitis   . Anemia 06/17/2012  . Anxiety and depression   . Cataract 06/17/2012  . Chronic kidney disease   . CVA (cerebral vascular accident) ([place])   . Diverticulosis 03/14/2008   Qualifier: Diagnosis of  By: Lenna Gilford MD, Deborra Medina   . Glaucoma   . Hemorrhoids 07/03/2011  . Hx of breast cancer    Left  . Hyperlipemia   . Hyperlipidemia   . Hypertension   . Hypothyroid   . Irritable bowel syndrome 09/17/2007   Qualifier: Diagnosis of  By: Rosana Hoes CMA, Tammy    . Lung nodule 07/06/2011  . Peripheral neuropathy   . Urinary incontinence     Past Surgical History:  Procedure Laterality Date  .  FUNDIPLICATION    . BREAST SURGERY     left breast mastectomy   . EYE SURGERY     TO DESOLVE AN AREA IN HER LEFT EYE?  .  SPINE SURGERY    . VAGINAL HYSTERECTOMY      No family history on file.  Social History   Social History  . Marital status: Widowed    Spouse name: N/A  . Number of children: N/A  . Years of education: N/A   Social History Main Topics  . Smoking status: Former Smoker    Types: Cigarettes    Quit date: 11/23/1962  . Smokeless tobacco: Never Used  . Alcohol use Yes     Comment: PT STATES DRINKS A BEER OR WINE OCCASSIONALLY, NOT WEEKLY  . Drug use: No  . Sexual activity: Not Asked   Other Topics Concern  . None   Social History Narrative   Work or School: none      Home Situation: lives with daughter and son and law      Spiritual Beliefs: [employer]      Lifestyle: no regular exercise; diet is ok              Current Outpatient Prescriptions:  .  acetaminophen (TYLENOL) 650 MG CR tablet, Take 650 mg by mouth every 8 (eight) hours as needed.  , Disp: , Rfl:  .  amLODipine (NORVASC) 2.5 MG tablet, TAKE 1 TABLET BY MOUTH EVERY DAY, Disp: 90 tablet, Rfl: 2 .  aspirin 325 MG tablet, Take 325 mg by mouth daily.  , Disp: , Rfl:  .  atenolol-chlorthalidone (TENORETIC) 50-25 MG  tablet, Take 1 tablet by mouth daily., Disp: 90 tablet, Rfl: 3 .  cetirizine (ZYRTEC) 10 MG tablet, Take 1 tablet (10 mg total) by mouth daily., Disp: 90 tablet, Rfl: 3 .  famotidine (PEPCID) 20 MG tablet, Take 1 tablet (20 mg total) by mouth 2 (two) times daily as needed., Disp: 180 tablet, Rfl: 3 .  fesoterodine (TOVIAZ) 4 MG TB24 tablet, Take 1 tablet (4 mg total) by mouth daily., Disp: 90 tablet, Rfl: 3 .  fish oil-omega-3 fatty acids 1000 MG capsule, Take 2 g by mouth daily.  , Disp: , Rfl:  .  fluocinonide cream (LIDEX) 0.05 %, Apply topically 2 (two) times daily., Disp: 90 g, Rfl: 3 .  fluticasone (FLONASE) 50 MCG/ACT nasal spray, Place 2 sprays into both nostrils daily., Disp: 16 g, Rfl: 3 .  fluvoxaMINE (LUVOX) 100 MG tablet, Take 1 tablet (100 mg total) by mouth daily., Disp: 90 tablet,  Rfl: 1 .  latanoprost (XALATAN) 0.005 % ophthalmic solution, Place 1 drop into both eyes at bedtime.  , Disp: , Rfl:  .  levothyroxine (SYNTHROID, LEVOTHROID) 50 MCG tablet, Take 1 tablet (50 mcg total) by mouth daily., Disp: 90 tablet, Rfl: 3 .  lisinopril (PRINIVIL,ZESTRIL) 20 MG tablet, Take 1 tablet (20 mg total) by mouth daily., Disp: 90 tablet, Rfl: 3 .  mirabegron ER (MYRBETRIQ) 50 MG TB24 tablet, Take 50 mg by mouth every other day., Disp: , Rfl:  .  Multiple Vitamins-Minerals (WOMENS MULTIVITAMIN PLUS PO), Take 1 tablet by mouth daily.  , Disp: , Rfl:  .  NON FORMULARY, Post Surgical Bras, Disp: , Rfl:  .  NON FORMULARY, Non-Silicone Breast Prosthesis, Disp: , Rfl:  .  NON FORMULARY, Silicone Breast Prosthesis, Disp: , Rfl:  .  nortriptyline (PAMELOR) 10 MG capsule, Take 1 capsule (10 mg total) by mouth at bedtime. (Patient taking differently: Take 30 mg by mouth at bedtime. ), Disp: 90 capsule, Rfl: 3 .  potassium chloride SA (K-DUR,KLOR-CON) 20 MEQ tablet, Take 1 tablet (20 mEq total) by mouth daily., Disp: 90 tablet, Rfl: 3 .  simvastatin (ZOCOR) 20 MG tablet, Take 1 tablet (20 mg total) by mouth at bedtime., Disp: 90 tablet, Rfl: 3 .  timolol (BETIMOL) 0.5 % ophthalmic solution, Place 1 drop into both eyes 2 (two) times daily., Disp: , Rfl:  .  tobramycin (TOBREX) 0.3 % ophthalmic ointment, Place 1 application into the left eye as needed., Disp: , Rfl:  .  UNABLE TO FIND, 174.9 Left mastectomy   L8030-Silicone Breast Prosthesis-1, Disp: 1 each, Rfl: 0  EXAM:  Vitals:   06/07/17 0914  BP: 124/78  Pulse: 63  Temp: 97.9 F (36.6 C)    Body mass index is 23.49 kg/m.  GENERAL: vitals reviewed and listed above, alert, oriented, appears well hydrated and in no acute distress  HEENT: atraumatic, conjunttiva clear, no obvious abnormalities on inspection of external nose and ears  NECK: no obvious masses on inspection  LUNGS: clear to auscultation bilaterally, no wheezes, rales  or rhonchi, good air movement  CV: HRRR, no peripheral edema  MS: moves all extremities without noticeable abnormality  PSYCH: pleasant and cooperative, no obvious depression or anxiety  ASSESSMENT AND PLAN:  Discussed the following assessment and plan:  Essential hypertension - Plan: Basic metabolic panel  Other specified hypothyroidism - Plan: TSH  Chronic kidney disease, unspecified CKD stage  Recurrent major depressive disorder, in full remission ([place])  Acute low back pain without sciatica, unspecified back pain laterality  -  reviewed meds, daughter now managing -labs per orders -would prefer lower dose tca if tolerated and they will titrate down to 10 and let us know is any trouble -back pain resolved, prn symptomatic care discussed -fall precuations -Patient advised to return or notify a doctor immediately if symptoms worsen or persist or new concerns arise.  Patient Instructions  BEFORE YOU LEAVE: -follow up: Medicare exam with susan and follow up with Dr. Maudie Mercury - same day in October -labs  Continue to use the cane and stay active  Heat and tiger balm as needed for back pain  Take blood pressure medications in the morning  Taper the nortriptyline down to 10mg  over the next few weeks     Colin Benton R., DO

## 2017-06-07 NOTE — Patient Instructions (Addendum)
BEFORE YOU LEAVE: -follow up: Medicare exam with susan and follow up with Dr. Maudie Mercury - same day in October -labs  Continue to use the cane and stay active  Heat and tiger balm as needed for back pain  Take blood pressure medications in the morning  Taper the nortriptyline down to 10mg  over the next few weeks

## 2017-06-10 ENCOUNTER — Telehealth: Payer: Self-pay | Admitting: Family Medicine

## 2017-06-10 NOTE — Telephone Encounter (Signed)
Pt daughter would like patient blood work result

## 2017-06-10 NOTE — Telephone Encounter (Signed)
See results note. 

## 2017-06-23 DIAGNOSIS — H3582 Retinal ischemia: Secondary | ICD-10-CM | POA: Diagnosis not present

## 2017-06-23 DIAGNOSIS — H353132 Nonexudative age-related macular degeneration, bilateral, intermediate dry stage: Secondary | ICD-10-CM | POA: Diagnosis not present

## 2017-06-23 DIAGNOSIS — H348312 Tributary (branch) retinal vein occlusion, right eye, stable: Secondary | ICD-10-CM | POA: Diagnosis not present

## 2017-06-23 DIAGNOSIS — H34832 Tributary (branch) retinal vein occlusion, left eye, with macular edema: Secondary | ICD-10-CM | POA: Diagnosis not present

## 2017-07-15 DIAGNOSIS — N3946 Mixed incontinence: Secondary | ICD-10-CM | POA: Diagnosis not present

## 2017-07-15 DIAGNOSIS — N3944 Nocturnal enuresis: Secondary | ICD-10-CM | POA: Diagnosis not present

## 2017-08-18 DIAGNOSIS — H348312 Tributary (branch) retinal vein occlusion, right eye, stable: Secondary | ICD-10-CM | POA: Diagnosis not present

## 2017-08-18 DIAGNOSIS — H3582 Retinal ischemia: Secondary | ICD-10-CM | POA: Diagnosis not present

## 2017-08-18 DIAGNOSIS — H353132 Nonexudative age-related macular degeneration, bilateral, intermediate dry stage: Secondary | ICD-10-CM | POA: Diagnosis not present

## 2017-08-18 DIAGNOSIS — H34832 Tributary (branch) retinal vein occlusion, left eye, with macular edema: Secondary | ICD-10-CM | POA: Diagnosis not present

## 2017-08-27 ENCOUNTER — Telehealth: Payer: Self-pay | Admitting: Family Medicine

## 2017-08-27 NOTE — Telephone Encounter (Signed)
Called pt to reschedule AWV on a day that Dr Maudie Mercury is working - Tues, [employer] or Fri ( appt should still be scheduled with [name] )

## 2017-08-27 NOTE — Progress Notes (Deleted)
Subjective:   Alison Cordova is a 83 y.o. female who presents for Medicare Annual (Subsequent) preventive examination.  The Patient was informed that the wellness visit is to identify future health risk and educate and initiate measures that can reduce risk for increased disease through the lifespan.    Annual Wellness Assessment  Reports health as   Preventive Screening -Counseling & Management  Medicare Annual Preventive Care Visit - Subsequent Last OV 05/2017  Bone density and mammogram 03/2014 dexa was normal No longer doing colonoscopies   Health Maintenance Due  Topic Date Due  . INFLUENZA VACCINE  06/23/2017     Lives with her dtr and son in law   [place] as poor, fair, good or great?   VS reviewed;   Diet   BMI  Exercise  Risk for falls due to UA incontinence   Dental  Stressors:   Sleep patterns:   Pain?    Cardiac Risk Factors Addressed Hyperlipidemia -ratio 3; chol 200; hdl 58; trig 185 Diabetes neg    Advanced Directives  Patient Care Team: Lucretia Kern, DO as PCP - General (Family Medicine) Lafayette Dragon, MD (Inactive) (Gastroenterology) Heath Lark, MD as Consulting Physician (Hematology and Oncology) Bjorn Loser, MD as Consulting Physician (Urology) Sherlynn Stalls, MD as Consulting Physician (Ophthalmology)               Objective:     Vitals: There were no vitals taken for this visit.  There is no height or weight on file to calculate BMI.   Tobacco History  Smoking Status  . Former Smoker  . Types: Cigarettes  . Quit date: 11/23/1962  Smokeless Tobacco  . Never Used     Counseling given: Not Answered   Past Medical History:  Diagnosis Date  . Allergic rhinitis   . Anemia 06/17/2012  . Anxiety and depression   . Cataract 06/17/2012  . Chronic kidney disease   . CVA (cerebral vascular accident) ([place])   . Diverticulosis 03/14/2008   Qualifier: Diagnosis of  By: Lenna Gilford MD, Deborra Medina   . Glaucoma     . Hemorrhoids 07/03/2011  . Hx of breast cancer    Left  . Hyperlipemia   . Hyperlipidemia   . Hypertension   . Hypothyroid   . Irritable bowel syndrome 09/17/2007   Qualifier: Diagnosis of  By: Rosana Hoes CMA, Tammy    . Lung nodule 07/06/2011  . Peripheral neuropathy   . Urinary incontinence    Past Surgical History:  Procedure Laterality Date  .  FUNDIPLICATION    . BREAST SURGERY     left breast mastectomy   . EYE SURGERY     TO DESOLVE AN AREA IN HER LEFT EYE?  . SPINE SURGERY    . VAGINAL HYSTERECTOMY     No family history on file. History  Sexual Activity  . Sexual activity: Not on file    Outpatient Encounter Prescriptions as of 09/01/2017  Medication Sig  . acetaminophen (TYLENOL) 650 MG CR tablet Take 650 mg by mouth every 8 (eight) hours as needed.    Marland Kitchen amLODipine (NORVASC) 2.5 MG tablet TAKE 1 TABLET BY MOUTH EVERY DAY  . aspirin 325 MG tablet Take 325 mg by mouth daily.    Marland Kitchen atenolol-chlorthalidone (TENORETIC) 50-25 MG tablet Take 1 tablet by mouth daily.  . cetirizine (ZYRTEC) 10 MG tablet Take 1 tablet (10 mg total) by mouth daily.  . famotidine (PEPCID) 20 MG tablet Take 1 tablet (  20 mg total) by mouth 2 (two) times daily as needed.  . fesoterodine (TOVIAZ) 4 MG TB24 tablet Take 1 tablet (4 mg total) by mouth daily.  . fish oil-omega-3 fatty acids 1000 MG capsule Take 2 g by mouth daily.    . fluocinonide cream (LIDEX) 0.05 % Apply topically 2 (two) times daily.  . fluticasone (FLONASE) 50 MCG/ACT nasal spray Place 2 sprays into both nostrils daily.  . fluvoxaMINE (LUVOX) 100 MG tablet Take 1 tablet (100 mg total) by mouth daily.  Marland Kitchen latanoprost (XALATAN) 0.005 % ophthalmic solution Place 1 drop into both eyes at bedtime.    Marland Kitchen levothyroxine (SYNTHROID, LEVOTHROID) 50 MCG tablet Take 1 tablet (50 mcg total) by mouth daily.  Marland Kitchen lisinopril (PRINIVIL,ZESTRIL) 20 MG tablet Take 1 tablet (20 mg total) by mouth daily.  . mirabegron ER (MYRBETRIQ) 50 MG TB24 tablet Take  50 mg by mouth every other day.  . Multiple Vitamins-Minerals (WOMENS MULTIVITAMIN PLUS PO) Take 1 tablet by mouth daily.    . NON FORMULARY Post Surgical Bras  . NON FORMULARY Non-Silicone Breast Prosthesis  . NON FORMULARY Silicone Breast Prosthesis  . nortriptyline (PAMELOR) 10 MG capsule Take 1 capsule (10 mg total) by mouth at bedtime. (Patient taking differently: Take 30 mg by mouth at bedtime. )  . potassium chloride SA (K-DUR,KLOR-CON) 20 MEQ tablet Take 1 tablet (20 mEq total) by mouth daily.  . simvastatin (ZOCOR) 20 MG tablet Take 1 tablet (20 mg total) by mouth at bedtime.  . timolol (BETIMOL) 0.5 % ophthalmic solution Place 1 drop into both eyes 2 (two) times daily.  Marland Kitchen tobramycin (TOBREX) 0.3 % ophthalmic ointment Place 1 application into the left eye as needed.  Marland Kitchen UNABLE TO FIND 174.9 Left mastectomy   L8030-Silicone Breast Prosthesis-1   No facility-administered encounter medications on file as of 09/01/2017.     Activities of Daily Living No flowsheet data found.  Patient Care Team: Lucretia Kern, DO as PCP - General (Family Medicine) Lafayette Dragon, MD (Inactive) (Gastroenterology) Heath Lark, MD as Consulting Physician (Hematology and Oncology) Bjorn Loser, MD as Consulting Physician (Urology) Sherlynn Stalls, MD as Consulting Physician (Ophthalmology)    Assessment:    *** Exercise Activities and Dietary recommendations    Goals    None     Fall Risk Fall Risk  03/01/2017 07/30/2016 11/28/2014 10/05/2014 10/05/2014  Falls in the past year? No No No Yes Yes  Number falls in past yr: - - - 1 1  Injury with Fall? - - - No -  Risk for fall due to : - - - History of fall(s) Impaired balance/gait   Depression Screen PHQ 2/9 Scores 03/01/2017 03/01/2017 07/30/2016 11/28/2014  PHQ - 2 Score 0 0 0 0     Cognitive Function        Immunization History  Administered Date(s) Administered  . Influenza Split 08/13/2011, 08/26/2012  . Influenza Whole 08/21/2008,  08/21/2009  . Influenza, High Dose Seasonal PF 08/19/2015, 07/30/2016  . Influenza,inj,Quad PF,6+ Mos 08/27/2014  . Influenza-Unspecified 08/23/2013  . Pneumococcal Conjugate-13 08/27/2014  . Pneumococcal Polysaccharide-23 09/26/2015  . Td 02/19/2010  . Zoster 03/28/2015   Screening Tests Health Maintenance  Topic Date Due  . INFLUENZA VACCINE  06/23/2017  . MAMMOGRAM  11/30/2021 (Originally 09/13/2015)  . TETANUS/TDAP  02/20/2020  . DEXA SCAN  Completed  . PNA vac Low Risk Adult  Completed      Plan:   ***   I have personally reviewed  and noted the following in the patient's chart:   . Medical and social history . Use of alcohol, tobacco or illicit drugs  . Current medications and supplements . Functional ability and status . Nutritional status . Physical activity . Advanced directives . List of other physicians . Hospitalizations, surgeries, and ER visits in previous 12 months . Vitals . Screenings to include cognitive, depression, and falls . Referrals and appointments  In addition, I have reviewed and discussed with patient certain preventive protocols, quality metrics, and best practice recommendations. A written personalized care plan for preventive services as well as general preventive health recommendations were provided to patient.     OFBPZ,WCHEN, RN  08/27/2017

## 2017-09-01 ENCOUNTER — Ambulatory Visit: Payer: PPO

## 2017-09-01 NOTE — Progress Notes (Signed)
Subjective:   Alison Cordova is a 83 y.o. female who presents for Medicare Annual (Subsequent) preventive examination.  The Patient was informed that the wellness visit is to identify future health risk and educate and initiate measures that can reduce risk for increased disease through the lifespan.    Annual Wellness Assessment  Reports health as  Dtr lives with her Lives on first floor  No falls - dtr states she fell when grand son came up and grabbed her  Shower has been remodeled  Independent in [place]; dtr assist with I ADLs   Preventive Screening -Counseling & Management  Medicare Annual Preventive Care Visit - Subsequent Last OV 05/2017 Hx of breast cancer with prosthesis on the left x 83 yo  Stopped taking her med and the dtr had to take over   There are no preventive care reminders to display for this patient. Had flu vaccine this year at pharmacy - 07/2017  No longer appropriate for other screens due to age Dexa scan 02/2014 seem to be normal  Mammogram 08/2014 - discussed repeat mammogram   MEds; Dtr started giving them Can't take K as it is to large  Can discuss with Dr. Maudie Mercury next week 09/06/2017   Dtr managing her meds Was to schedule with Dr. Maudie Mercury but she is seeing her 10/15  VS reviewed;   Diet  dtr cooks Breakfast; eggs, bacon and toast, varies with toast and fruit Pancakes Lunch; does not eat lunch; eats candy  Supper; vegetables  No weight loss  BMI 22.5   Exercise States she does not do anything. Does not like to do sitter size  Advised to get up and down from chair multiple times per day   Fall risk  Get up and go approx 15 sec little off balance Will not go to exercise class or do exercises at home per the dtr    Cardiac Risk Factors Addressed Hyperlipidemia  -chol/hdl ratio is3; chol 200; hdl 58; ldld 104; trig 185  Diabetes- last BS elevated   Advanced Directives Patient Care Team: Lucretia Kern, DO as PCP - General (Family  Medicine) Lafayette Dragon, MD (Inactive) (Gastroenterology) Heath Lark, MD as Consulting Physician (Hematology and Oncology) Bjorn Loser, MD as Consulting Physician (Urology) Sherlynn Stalls, MD as Consulting Physician (Ophthalmology)    Cardiac Risk Factors include: advanced age (>60men, >53 women);family history of premature cardiovascular disease;hypertension;sedentary lifestyle     Objective:     Vitals: BP 130/70   Pulse 69   Ht 5\' 1"  (1.549 m)   Wt 119 lb (54 kg)   SpO2 97%   BMI 22.48 kg/m   Body mass index is 22.48 kg/m.   Tobacco History  Smoking Status  . Former Smoker  . Types: Cigarettes  . Quit date: 11/23/1962  Smokeless Tobacco  . Never Used     Counseling given: Yes   Past Medical History:  Diagnosis Date  . Allergic rhinitis   . Anemia 06/17/2012  . Anxiety and depression   . Cataract 06/17/2012  . Chronic kidney disease   . CVA (cerebral vascular accident) ([place])   . Diverticulosis 03/14/2008   Qualifier: Diagnosis of  By: Lenna Gilford MD, Deborra Medina   . Glaucoma   . Hemorrhoids 07/03/2011  . Hx of breast cancer    Left  . Hyperlipemia   . Hyperlipidemia   . Hypertension   . Hypothyroid   . Irritable bowel syndrome 09/17/2007   Qualifier: Diagnosis of  By: Rosana Hoes  CMA, Tammy    . Lung nodule 07/06/2011  . Peripheral neuropathy   . Urinary incontinence    Past Surgical History:  Procedure Laterality Date  .  FUNDIPLICATION    . BREAST SURGERY     left breast mastectomy   . EYE SURGERY     TO DESOLVE AN AREA IN HER LEFT EYE?  . SPINE SURGERY    . VAGINAL HYSTERECTOMY     No family history on file. History  Sexual Activity  . Sexual activity: Not on file    Outpatient Encounter Prescriptions as of 09/02/2017  Medication Sig  . acetaminophen (TYLENOL) 650 MG CR tablet Take 650 mg by mouth every 8 (eight) hours as needed.    Marland Kitchen amLODipine (NORVASC) 2.5 MG tablet TAKE 1 TABLET BY MOUTH EVERY DAY  . aspirin 325 MG tablet Take 325 mg by mouth  daily.    Marland Kitchen atenolol-chlorthalidone (TENORETIC) 50-25 MG tablet Take 1 tablet by mouth daily.  . cetirizine (ZYRTEC) 10 MG tablet Take 1 tablet (10 mg total) by mouth daily.  . famotidine (PEPCID) 20 MG tablet Take 1 tablet (20 mg total) by mouth 2 (two) times daily as needed.  . fesoterodine (TOVIAZ) 4 MG TB24 tablet Take 1 tablet (4 mg total) by mouth daily.  . fluocinonide cream (LIDEX) 0.05 % Apply topically 2 (two) times daily.  . fluticasone (FLONASE) 50 MCG/ACT nasal spray Place 2 sprays into both nostrils daily.  . fluvoxaMINE (LUVOX) 100 MG tablet Take 1 tablet (100 mg total) by mouth daily.  Marland Kitchen latanoprost (XALATAN) 0.005 % ophthalmic solution Place 1 drop into both eyes at bedtime.    Marland Kitchen levothyroxine (SYNTHROID, LEVOTHROID) 50 MCG tablet Take 1 tablet (50 mcg total) by mouth daily.  Marland Kitchen lisinopril (PRINIVIL,ZESTRIL) 20 MG tablet Take 1 tablet (20 mg total) by mouth daily.  . mirabegron ER (MYRBETRIQ) 50 MG TB24 tablet Take 50 mg by mouth every other day.  . Multiple Vitamins-Minerals (WOMENS MULTIVITAMIN PLUS PO) Take 1 tablet by mouth daily.    . NON FORMULARY Post Surgical Bras  . NON FORMULARY Non-Silicone Breast Prosthesis  . NON FORMULARY Silicone Breast Prosthesis  . nortriptyline (PAMELOR) 10 MG capsule Take 1 capsule (10 mg total) by mouth at bedtime. (Patient taking differently: Take 30 mg by mouth at bedtime. )  . simvastatin (ZOCOR) 20 MG tablet Take 1 tablet (20 mg total) by mouth at bedtime.  . timolol (BETIMOL) 0.5 % ophthalmic solution Place 1 drop into both eyes 2 (two) times daily.  Marland Kitchen tobramycin (TOBREX) 0.3 % ophthalmic ointment Place 1 application into the left eye as needed.  Marland Kitchen UNABLE TO FIND 174.9 Left mastectomy   L8030-Silicone Breast Prosthesis-1  . fish oil-omega-3 fatty acids 1000 MG capsule Take 2 g by mouth daily.    . potassium chloride SA (K-DUR,KLOR-CON) 20 MEQ tablet Take 1 tablet (20 mEq total) by mouth daily. (Patient not taking: Reported on  09/02/2017)   No facility-administered encounter medications on file as of 09/02/2017.     Activities of Daily Living In your present state of health, do you have any difficulty performing the following activities: 09/02/2017  Hearing? Y  Vision? Y  Walking or climbing stairs? Y  Dressing or bathing? N  Doing errands, shopping? N  Preparing Food and eating ? N  Using the Toilet? N  In the past six months, have you accidently leaked urine? (No Data)  Comment on bladder medicine   Do you have problems with loss  of bowel control? N  Managing your Medications? Y  Comment dtr is managing   Managing your Finances? Y  Housekeeping or managing your Housekeeping? N  Some recent data might be hidden    Patient Care Team: Lucretia Kern, DO as PCP - General (Family Medicine) Lafayette Dragon, MD (Inactive) (Gastroenterology) Heath Lark, MD as Consulting Physician (Hematology and Oncology) Bjorn Loser, MD as Consulting Physician (Urology) Sherlynn Stalls, MD as Consulting Physician (Ophthalmology)    Assessment:     Exercise Activities and Dietary recommendations Current Exercise Habits: Home exercise routine, Intensity: Mild  Goals    . Exercise 150 minutes per week (moderate activity)          Try to exercise more; get up and down often from the chair        Fall Risk Fall Risk  09/02/2017 03/01/2017 07/30/2016 11/28/2014 10/05/2014  Falls in the past year? Yes No No No Yes  Number falls in past yr: 1 - - - 1  Injury with Fall? - - - - No  Risk for fall due to : (No Data) - - - History of fall(s)  Risk for fall due to: Comment grand son grabbed her and she got off balance  - - - -  Follow up Education provided - - - -   Depression Screen PHQ 2/9 Scores 09/02/2017 03/01/2017 03/01/2017 07/30/2016  PHQ - 2 Score 0 0 0 0     Cognitive Function MMSE - Mini Mental State Exam 09/02/2017  Orientation to time 3  Orientation to Place 5  Registration 3  Attention/ Calculation 5    Recall 1  Language- name 2 objects 2  Language- repeat 1  Language- follow 3 step command 3  Language- read & follow direction 1  Write a sentence 1  Copy design 1  Total score 26      has issues using new tools; had to have a apple phone but can't use it. Wanted an Ipad but can't use it. She would like to take pic's with the ipad. Instructed for grand-dtr to show her how to use it; just the camera and write her out directions on a post card.  She can follow directions but has difficulty with new items and gets confused   Immunization History  Administered Date(s) Administered  . Influenza Split 08/13/2011, 08/26/2012  . Influenza Whole 08/21/2008, 08/21/2009  . Influenza, High Dose Seasonal PF 08/19/2015, 07/30/2016  . Influenza,inj,Quad PF,6+ Mos 08/27/2014, 08/20/2017  . Influenza-Unspecified 08/23/2013  . Pneumococcal Conjugate-13 08/27/2014  . Pneumococcal Polysaccharide-23 09/26/2015  . Td 02/19/2010  . Zoster 03/28/2015   Screening Tests Health Maintenance  Topic Date Due  . MAMMOGRAM  11/30/2021 (Originally 09/13/2015)  . TETANUS/TDAP  02/20/2020  . INFLUENZA VACCINE  Completed  . DEXA SCAN  Completed  . PNA vac Low Risk Adult  Completed      Plan:     PCP Notes  Health Maintenance Mammogram  Has had flu vaccine at [facility]  Can't take k due to the size of the pill; may take liquid  Abnormal Screens  MMSE 26/30; judgement is intact but slow; was able to subtract 3 from 20 x 5 Recall was 1 of 3 Clock was good; counted in 5's to put the 2nd hand on the 7 for 3:35 No depression; very verbal   Referrals  Discussed talking to Dr. Isaiah Blakes about repeat mammogram  Discussed risk and early detection but it is her choice at this time.  Patient concerns; None;   Nurse Concerns; Eats well; not weight loss, living with family and good support Very careful when she walks  Has appropriate shoes   She is not taking her k as the pill is to big to  swallow. Will discuss with Dr. Maudie Mercury next week   Next PCP apt 10/15       I have personally reviewed and noted the following in the patient's chart:   . Medical and social history . Use of alcohol, tobacco or illicit drugs  . Current medications and supplements . Functional ability and status . Nutritional status . Physical activity . Advanced directives . List of other physicians . Hospitalizations, surgeries, and ER visits in previous 12 months . Vitals . Screenings to include cognitive, depression, and falls . Referrals and appointments  In addition, I have reviewed and discussed with patient certain preventive protocols, quality metrics, and best practice recommendations. A written personalized care plan for preventive services as well as general preventive health recommendations were provided to patient.     Wynetta Fines, RN  09/02/2017

## 2017-09-02 ENCOUNTER — Ambulatory Visit (INDEPENDENT_AMBULATORY_CARE_PROVIDER_SITE_OTHER): Payer: PPO

## 2017-09-02 VITALS — BP 130/70 | HR 69 | Ht 61.0 in | Wt 119.0 lb

## 2017-09-02 DIAGNOSIS — Z0000 Encounter for general adult medical examination without abnormal findings: Secondary | ICD-10-CM

## 2017-09-02 NOTE — Progress Notes (Signed)
KIM, HANNAH R., DO  

## 2017-09-02 NOTE — Patient Instructions (Addendum)
Alison Cordova , Thank you for taking time to come for your Medicare Wellness Visit. I appreciate your ongoing commitment to your health goals. Please review the following plan we discussed and let me know if I can assist you in the future.   Repeat mamogram is up to you on the right Since you are at risk and possible may want removal of any further cancer, may indicate repeat [phone] - American Cancer society Can also check with Dr. Isaiah Blakes  Will go back to [facility] to recheck hearing aid    These are the goals we discussed: Goals    . Exercise 150 minutes per week (moderate activity)          Try to exercise more; get up and down often from the chair         This is a list of the screening recommended for you and due dates:  Health Maintenance  Topic Date Due  . Flu Shot  06/23/2017  . Mammogram  11/30/2021*  . Tetanus Vaccine  02/20/2020  . DEXA scan (bone density measurement)  Completed  . Pneumonia vaccines  Completed  *Topic was postponed. The date shown is not the original due date.   Community Occupational psychologist of Services Cost  A Matter of Balance Class locations vary. Call [place] on Aging for more information.  http://dawson-may.com/ [phone] 8-Session program addressing the fear of falling and increasing activity levels of older adults Free to minimal cost  A.C.T. By [employer] [address], [place], [place] [address].  BetaBlues.dk [phone]  Personal training, gym, classes including Silver Sneakers* and ACTion for Aging Adults Fee-based  A.H.O.Y. (Add Health to Albert) Airs on Time [employer] 13, M-F at [place]: [employer],  [place] [facility] [place],  [place], [facility], [address] [facility], [place], [place], [place] [address]  [place] Location: Sharrell Ku. [employer] [place]      [phone]  [phone]  [phone]  [phone]  [phone]  [phone]  [phone]  [phone]  [phone]  [phone]    [phone] A total-body conditioning class for adults 24 and older; designed to increase muscular strength, endurance, range of movement, flexibility, balance, agility and coordination Free  [facility] [place], [place] [address]      [address]      [phone]      Pilate's class for individualsreturning to exercise after an injury, before or after surgery or for individuals with complex musculoskeletal issues; designed to improve strength, balance , flexibility      $15/class  [place] [address] [place], [place] [address] [email or web address] [place] classes for beginners to advanced [place], [place] [address] [email or web address] [place] Chair Exercises Free, ages 58 and older; Ages 62-59 fee based  Marvia Pickles, [facility] [address] [place],  [address] Seniorcenter'[email or web address] .gov [phone]  A.H.O.Y. Tai Chi  Fee-based Donation based or free  [place] Class locations vary.  Call or email Angela Burke or view website for more information. [email or web address] .[email or web address] [email or web address].[email or web address] [phone] Ongoing classes at local YMCAs and gyms Fee-based  Silver Sneakers A.C.T. By  [place] [employer]: [employer] [place] [phone]  [phone] Classes designed for older adults who want to improve their strength, flexibility, balance and endurance.   Silver sneakers is covered by some insurance plans and includes a fitness center membership at participating locations. Find out more by calling [phone] or visiting www.[email or web address] Covered by some insurance plans  [facility] [place] [phone] A.H.O.Y., fitness room, personal training, fitness classes for injury prevention, strength, balance, flexibility, water fitness classes Ages 55+: $77 for 6 months; Ages 20-54: $49 for 6 months  Tai Chi for Everybody [facility] [address] [place], [place] [address] [email or web address] .Patsi Sears [phone] Tai Chi classes for beginners to advanced; geared for seniors Donation Based      [employer] ([place] Others Participate in Exercise     Loyal Gambler. Rosana Hoes, PhD, [place] [email or web address] .[email or web address] Impact     [phone]     A comprehensive fitness program for adults.  The program paris senior-level undergraduates Kinesiology students with adults who desire to learn how to exercise safely.  Includes a structural exercise class focusing on functional fitnesss     $100/semester in fall and spring; $75 in summer (no trainers)    *Silver Sneakers is covered by some Personal assistant and includes a  Radio producer at participating locations.  Find out more by calling [phone] or visiting [email or web address]  For additional health and human services resources  for senior adults, please contact SeniorLine at [phone] in [place] and [place] at [phone] in all other areas.  Prevention of falls: Remove rugs or any tripping hazards in the home Use Non slip mats in bathtubs and showers Placing grab bars next to the toilet and or shower Placing handrails on both sides of the stair way Adding extra lighting in the home.   Personal safety issues reviewed:  1. Consider starting a community watch program per [facility] 2.  Changes batteries is smoke detector and/or carbon monoxide detector  3.  If you have firearms; keep them in a safe place 4.  Wear protection when in the sun; Always wear sunscreen or a hat; It is good to have your doctor check your skin annually or review any new areas of concern 5. Driving safety; Keep in the right lane; stay 3 car lengths behind the car in front of you on the highway; look 3 times prior to pulling out; carry your cell phone everywhere you go!    Learn about the Yellow Dot program:  The program allows first responders at your emergency to have access to who your physician is, as well as your medications and medical conditions.  Citizens requesting the Yellow Dot Packages should contact Master Corporal Nunzio Cobbs at the [facility] [phone] for the first week of the program and beginning the week after Easter citizens should contact their Scientist, physiological.        Fall Prevention in the Home Falls can cause injuries. They can happen to  people of all ages. There are many things you can do to make your home safe and to help prevent falls. What can I do on the outside of my home?  Regularly fix the edges of walkways and driveways and fix any cracks.  Remove anything that might make you trip as you walk through a door, such as a raised step or threshold.  Trim any bushes or trees on the path to your home.  Use bright outdoor lighting.  Clear any walking  paths of anything that might make someone trip, such as rocks or tools.  Regularly check to see if handrails are loose or broken. Make sure that both sides of any steps have handrails.  Any raised decks and porches should have guardrails on the edges.  Have any leaves, snow, or ice cleared regularly.  Use sand or salt on walking paths during winter.  Clean up any spills in your garage right away. This includes oil or grease spills. What can I do in the bathroom?  Use night lights.  Install grab bars by the toilet and in the tub and shower. Do not use towel bars as grab bars.  Use non-skid mats or decals in the tub or shower.  If you need to sit down in the shower, use a plastic, non-slip stool.  Keep the floor dry. Clean up any water that spills on the floor as soon as it happens.  Remove soap buildup in the tub or shower regularly.  Attach bath mats securely with double-sided non-slip rug tape.  Do not have throw rugs and other things on the floor that can make you trip. What can I do in the bedroom?  Use night lights.  Make sure that you have a light by your bed that is easy to reach.  Do not use any sheets or blankets that are too big for your bed. They should not hang down onto the floor.  Have a firm chair that has side arms. You can use this for support while you get dressed.  Do not have throw rugs and other things on the floor that can make you trip. What can I do in the kitchen?  Clean up any spills right away.  Avoid walking on wet floors.  Keep items that you use a lot in easy-to-reach places.  If you need to reach something above you, use a strong step stool that has a grab bar.  Keep electrical cords out of the way.  Do not use floor polish or wax that makes floors slippery. If you must use wax, use non-skid floor wax.  Do not have throw rugs and other things on the floor that can make you trip. What can I do with my stairs?  Do not leave any items  on the stairs.  Make sure that there are handrails on both sides of the stairs and use them. Fix handrails that are broken or loose. Make sure that handrails are as long as the stairways.  Check any carpeting to make sure that it is firmly attached to the stairs. Fix any carpet that is loose or worn.  Avoid having throw rugs at the top or bottom of the stairs. If you do have throw rugs, attach them to the floor with carpet tape.  Make sure that you have a light switch at the top of the stairs and the bottom of the stairs. If you do not have them, ask someone to add them for you. What else  can I do to help prevent falls?  Wear shoes that: ? Do not have high heels. ? Have rubber bottoms. ? Are comfortable and fit you well. ? Are closed at the toe. Do not wear sandals.  If you use a stepladder: ? Make sure that it is fully opened. Do not climb a closed stepladder. ? Make sure that both sides of the stepladder are locked into place. ? Ask someone to hold it for you, if possible.  Clearly mark and make sure that you can see: ? Any grab bars or handrails. ? First and last steps. ? Where the edge of each step is.  Use tools that help you move around (mobility aids) if they are needed. These include: ? Canes. ? Walkers. ? Scooters. ? Crutches.  Turn on the lights when you go into a dark area. Replace any light bulbs as soon as they burn out.  Set up your furniture so you have a clear path. Avoid moving your furniture around.  If any of your floors are uneven, fix them.  If there are any pets around you, be aware of where they are.  Review your medicines with your doctor. Some medicines can make you feel dizzy. This can increase your chance of falling. Ask your doctor what other things that you can do to help prevent falls. This information is not intended to replace advice given to you by your health care provider. Make sure you discuss any questions you have with your health care  provider. Document Released: 09/05/2009 Document Revised: 04/16/2016 Document Reviewed: 12/14/2014 Elsevier Interactive Patient Education  2018 [employer].  Health Maintenance, Female Adopting a healthy lifestyle and getting preventive care can go a long way to promote health and wellness. Talk with your health care provider about what schedule of regular examinations is right for you. This is a good chance for you to check in with your provider about disease prevention and staying healthy. In between checkups, there are plenty of things you can do on your own. Experts have done a lot of research about which lifestyle changes and preventive measures are most likely to keep you healthy. Ask your health care provider for more information. Weight and diet Eat a healthy diet  Be sure to include plenty of vegetables, fruits, low-fat dairy products, and lean protein.  Do not eat a lot of foods high in solid fats, added sugars, or salt.  Get regular exercise. This is one of the most important things you can do for your health. ? Most adults should exercise for at least 150 minutes each week. The exercise should increase your heart rate and make you sweat (moderate-intensity exercise). ? Most adults should also do strengthening exercises at least twice a week. This is in addition to the moderate-intensity exercise.  Maintain a healthy weight  Body mass index (BMI) is a measurement that can be used to identify possible weight problems. It estimates body fat based on height and weight. Your health care provider can help determine your BMI and help you achieve or maintain a healthy weight.  For females 40 years of age and older: ? A BMI below 18.5 is considered underweight. ? A BMI of 18.5 to 24.9 is normal. ? A BMI of 25 to 29.9 is considered overweight. ? A BMI of 30 and above is considered obese.  Watch levels of cholesterol and blood lipids  You should start having your blood tested for  lipids and cholesterol at 81 years of  age, then have this test every 5 years.  You may need to have your cholesterol levels checked more often if: ? Your lipid or cholesterol levels are high. ? You are older than 81 years of age. ? You are at high risk for heart disease.  Cancer screening Lung Cancer  Lung cancer screening is recommended for adults 48-67 years old who are at high risk for lung cancer because of a history of smoking.  A yearly low-dose CT scan of the lungs is recommended for people who: ? Currently smoke. ? Have quit within the past 15 years. ? Have at least a 30-pack-year history of smoking. A pack year is smoking an average of one pack of cigarettes a day for 1 year.  Yearly screening should continue until it has been 15 years since you quit.  Yearly screening should stop if you develop a health problem that would prevent you from having lung cancer treatment.  Breast Cancer  Practice breast self-awareness. This means understanding how your breasts normally appear and feel.  It also means doing regular breast self-exams. Let your health care provider know about any changes, no matter how small.  If you are in your 20s or 30s, you should have a clinical breast exam (CBE) by a health care provider every 1-3 years as part of a regular health exam.  If you are 54 or older, have a CBE every year. Also consider having a breast X-ray (mammogram) every year.  If you have a family history of breast cancer, talk to your health care provider about genetic screening.  If you are at high risk for breast cancer, talk to your health care provider about having an MRI and a mammogram every year.  Breast cancer gene (BRCA) assessment is recommended for women who have family members with BRCA-related cancers. BRCA-related cancers include: ? Breast. ? Ovarian. ? Tubal. ? Peritoneal cancers.  Results of the assessment will determine the need for genetic counseling and BRCA1 and  BRCA2 testing.  Cervical Cancer Your health care provider may recommend that you be screened regularly for cancer of the pelvic organs (ovaries, uterus, and vagina). This screening involves a pelvic examination, including checking for microscopic changes to the surface of your cervix (Pap test). You may be encouraged to have this screening done every 3 years, beginning at age 18.  For women ages 72-65, health care providers may recommend pelvic exams and Pap testing every 3 years, or they may recommend the Pap and pelvic exam, combined with testing for human papilloma virus (HPV), every 5 years. Some types of HPV increase your risk of cervical cancer. Testing for HPV may also be done on women of any age with unclear Pap test results.  Other health care providers may not recommend any screening for nonpregnant women who are considered low risk for pelvic cancer and who do not have symptoms. Ask your health care provider if a screening pelvic exam is right for you.  If you have had past treatment for cervical cancer or a condition that could lead to cancer, you need Pap tests and screening for cancer for at least 20 years after your treatment. If Pap tests have been discontinued, your risk factors (such as having a new sexual partner) need to be reassessed to determine if screening should resume. Some women have medical problems that increase the chance of getting cervical cancer. In these cases, your health care provider may recommend more frequent screening and Pap tests.  Colorectal Cancer  This type of cancer can be detected and often prevented.  Routine colorectal cancer screening usually begins at 81 years of age and continues through 81 years of age.  Your health care provider may recommend screening at an earlier age if you have risk factors for colon cancer.  Your health care provider may also recommend using home test kits to check for hidden blood in the stool.  A small camera at the  end of a tube can be used to examine your colon directly (sigmoidoscopy or colonoscopy). This is done to check for the earliest forms of colorectal cancer.  Routine screening usually begins at age 39.  Direct examination of the colon should be repeated every 5-10 years through 81 years of age. However, you may need to be screened more often if early forms of precancerous polyps or small growths are found.  Skin Cancer  Check your skin from head to toe regularly.  Tell your health care provider about any new moles or changes in moles, especially if there is a change in a mole's shape or color.  Also tell your health care provider if you have a mole that is larger than the size of a pencil eraser.  Always use sunscreen. Apply sunscreen liberally and repeatedly throughout the day.  Protect yourself by wearing long sleeves, pants, a wide-brimmed hat, and sunglasses whenever you are outside.  Heart disease, diabetes, and high blood pressure  High blood pressure causes heart disease and increases the risk of stroke. High blood pressure is more likely to develop in: ? People who have blood pressure in the high end of the normal range (130-139/85-89 mm Hg). ? People who are overweight or obese. ? People who are African American.  If you are 3-30 years of age, have your blood pressure checked every 3-5 years. If you are 61 years of age or older, have your blood pressure checked every year. You should have your blood pressure measured twice-once when you are at a hospital or clinic, and once when you are not at a hospital or clinic. Record the average of the two measurements. To check your blood pressure when you are not at a hospital or clinic, you can use: ? An automated blood pressure machine at a pharmacy. ? A home blood pressure monitor.  If you are between 92 years and 13 years old, ask your health care provider if you should take aspirin to prevent strokes.  Have regular diabetes  screenings. This involves taking a blood sample to check your fasting blood sugar level. ? If you are at a normal weight and have a low risk for diabetes, have this test once every three years after 81 years of age. ? If you are overweight and have a high risk for diabetes, consider being tested at a younger age or more often. Preventing infection Hepatitis B  If you have a higher risk for hepatitis B, you should be screened for this virus. You are considered at high risk for hepatitis B if: ? You were born in a country where hepatitis B is common. Ask your health care provider which countries are considered high risk. ? Your parents were born in a high-risk country, and you have not been immunized against hepatitis B (hepatitis B vaccine). ? You have HIV or AIDS. ? You use needles to inject street drugs. ? You live with someone who has hepatitis B. ? You have had sex with someone who has hepatitis B. ? You get hemodialysis  treatment. ? You take certain medicines for conditions, including cancer, organ transplantation, and autoimmune conditions.  Hepatitis C  Blood testing is recommended for: ? Everyone born from [birth year] through 1963. ? Anyone with known risk factors for hepatitis C.  Sexually transmitted infections (STIs)  You should be screened for sexually transmitted infections (STIs) including gonorrhea and chlamydia if: ? You are sexually active and are younger than 81 years of age. ? You are older than 81 years of age and your health care provider tells you that you are at risk for this type of infection. ? Your sexual activity has changed since you were last screened and you are at an increased risk for chlamydia or gonorrhea. Ask your health care provider if you are at risk.  If you do not have HIV, but are at risk, it may be recommended that you take a prescription medicine daily to prevent HIV infection. This is called pre-exposure prophylaxis (PrEP). You are considered at risk  if: ? You are sexually active and do not regularly use condoms or know the HIV status of your partner(s). ? You take drugs by injection. ? You are sexually active with a partner who has HIV.  Talk with your health care provider about whether you are at high risk of being infected with HIV. If you choose to begin PrEP, you should first be tested for HIV. You should then be tested every 3 months for as long as you are taking PrEP. Pregnancy  If you are premenopausal and you may become pregnant, ask your health care provider about preconception counseling.  If you may become pregnant, take 400 to 800 micrograms (mcg) of folic acid every day.  If you want to prevent pregnancy, talk to your health care provider about birth control (contraception). Osteoporosis and menopause  Osteoporosis is a disease in which the bones lose minerals and strength with aging. This can result in serious bone fractures. Your risk for osteoporosis can be identified using a bone density scan.  If you are 69 years of age or older, or if you are at risk for osteoporosis and fractures, ask your health care provider if you should be screened.  Ask your health care provider whether you should take a calcium or vitamin D supplement to lower your risk for osteoporosis.  Menopause may have certain physical symptoms and risks.  Hormone replacement therapy may reduce some of these symptoms and risks. Talk to your health care provider about whether hormone replacement therapy is right for you. Follow these instructions at home:  Schedule regular health, dental, and eye exams.  Stay current with your immunizations.  Do not use any tobacco products including cigarettes, chewing tobacco, or electronic cigarettes.  If you are pregnant, do not drink alcohol.  If you are breastfeeding, limit how much and how often you drink alcohol.  Limit alcohol intake to no more than 1 drink per day for nonpregnant women. One drink  equals 12 ounces of beer, 5 ounces of wine, or 1 ounces of hard liquor.  Do not use street drugs.  Do not share needles.  Ask your health care provider for help if you need support or information about quitting drugs.  Tell your health care provider if you often feel depressed.  Tell your health care provider if you have ever been abused or do not feel safe at home. This information is not intended to replace advice given to you by your health care provider. Make sure you discuss any  questions you have with your health care provider. Document Released: 05/25/2011 Document Revised: 04/16/2016 Document Reviewed: 08/13/2015 Elsevier Interactive Patient Education  [employer].

## 2017-09-03 NOTE — Progress Notes (Signed)
HPI:  Alison Cordova is a pleasant 83 y.o. here for follow up. Chronic medical problems summarized below were reviewed for changes. They asked about dermatology offices. She has a small skin lesion on her know she is concerned about. Not particularly bothersome, but she does not like it. Otherwise she is doing well and is without complaints. Denies CP, SOB, DOE, treatment intolerance or new symptoms. Recheck kidney function  HTN/hx CVA/CKD: -meds:amlodipine, asa, atenolol-chlorthalidone, lisinopril  Hypothyroid:  -med: levothyroxine  HLD/Obesity: -on statin  OAB: -meds: toviaz, myrbetriq  Anxiety and Depression: -saw psychiatrist and was stable on the same medications for many many years and now no longer sees psychiatrist -meds: nortriptyline, fluvoxamine  GERD: -meds: pepcid  Hx breast Ca: -with breast prosthesis  ROS: See pertinent positives and negatives per HPI.  Past Medical History:  Diagnosis Date  . Allergic rhinitis   . Anemia 06/17/2012  . Anxiety and depression   . Cataract 06/17/2012  . Chronic kidney disease   . CVA (cerebral vascular accident) ([place])   . Diverticulosis 03/14/2008   Qualifier: Diagnosis of  By: Lenna Gilford MD, Deborra Medina   . Glaucoma   . Hemorrhoids 07/03/2011  . Hx of breast cancer    Left  . Hyperlipemia   . Hyperlipidemia   . Hypertension   . Hypothyroid   . Irritable bowel syndrome 09/17/2007   Qualifier: Diagnosis of  By: Rosana Hoes CMA, Tammy    . Lung nodule 07/06/2011  . Peripheral neuropathy   . Urinary incontinence     Past Surgical History:  Procedure Laterality Date  .  FUNDIPLICATION    . BREAST SURGERY     left breast mastectomy   . EYE SURGERY     TO DESOLVE AN AREA IN HER LEFT EYE?  . SPINE SURGERY    . VAGINAL HYSTERECTOMY      No family history on file.  Social History   Social History  . Marital status: Widowed    Spouse name: N/A  . Number of children: N/A  . Years of education: N/A   Social History Main  Topics  . Smoking status: Former Smoker    Types: Cigarettes    Quit date: 11/23/1962  . Smokeless tobacco: Never Used  . Alcohol use Yes     Comment: PT STATES DRINKS A BEER OR WINE OCCASSIONALLY, NOT WEEKLY  . Drug use: No  . Sexual activity: Not Asked   Other Topics Concern  . None   Social History Narrative   Work or School: none      Home Situation: lives with daughter and son and law      Spiritual Beliefs: [employer]      Lifestyle: no regular exercise; diet is ok              Current Outpatient Prescriptions:  .  acetaminophen (TYLENOL) 650 MG CR tablet, Take 650 mg by mouth every 8 (eight) hours as needed.  , Disp: , Rfl:  .  amLODipine (NORVASC) 2.5 MG tablet, TAKE 1 TABLET BY MOUTH EVERY DAY, Disp: 90 tablet, Rfl: 2 .  aspirin 325 MG tablet, Take 325 mg by mouth daily.  , Disp: , Rfl:  .  atenolol-chlorthalidone (TENORETIC) 50-25 MG tablet, Take 1 tablet by mouth daily., Disp: 90 tablet, Rfl: 3 .  cetirizine (ZYRTEC) 10 MG tablet, Take 1 tablet (10 mg total) by mouth daily., Disp: 90 tablet, Rfl: 3 .  famotidine (PEPCID) 20 MG tablet, Take 1 tablet (20 mg  total) by mouth 2 (two) times daily as needed., Disp: 180 tablet, Rfl: 3 .  fesoterodine (TOVIAZ) 4 MG TB24 tablet, Take 1 tablet (4 mg total) by mouth daily., Disp: 90 tablet, Rfl: 3 .  fish oil-omega-3 fatty acids 1000 MG capsule, Take 2 g by mouth daily.  , Disp: , Rfl:  .  fluocinonide cream (LIDEX) 0.05 %, Apply topically 2 (two) times daily., Disp: 90 g, Rfl: 3 .  fluticasone (FLONASE) 50 MCG/ACT nasal spray, Place 2 sprays into both nostrils daily., Disp: 16 g, Rfl: 3 .  fluvoxaMINE (LUVOX) 100 MG tablet, Take 1 tablet (100 mg total) by mouth daily., Disp: 90 tablet, Rfl: 1 .  latanoprost (XALATAN) 0.005 % ophthalmic solution, Place 1 drop into both eyes at bedtime.  , Disp: , Rfl:  .  levothyroxine (SYNTHROID, LEVOTHROID) 50 MCG tablet, Take 1 tablet (50 mcg total) by mouth daily., Disp: 90 tablet, Rfl:  3 .  lisinopril (PRINIVIL,ZESTRIL) 20 MG tablet, Take 1 tablet (20 mg total) by mouth daily., Disp: 90 tablet, Rfl: 3 .  mirabegron ER (MYRBETRIQ) 50 MG TB24 tablet, Take 50 mg by mouth every other day., Disp: , Rfl:  .  Multiple Vitamins-Minerals (WOMENS MULTIVITAMIN PLUS PO), Take 1 tablet by mouth daily.  , Disp: , Rfl:  .  NON FORMULARY, Post Surgical Bras, Disp: , Rfl:  .  NON FORMULARY, Non-Silicone Breast Prosthesis, Disp: , Rfl:  .  NON FORMULARY, Silicone Breast Prosthesis, Disp: , Rfl:  .  nortriptyline (PAMELOR) 10 MG capsule, Take 1 capsule (10 mg total) by mouth at bedtime. (Patient taking differently: Take 30 mg by mouth at bedtime. ), Disp: 90 capsule, Rfl: 3 .  potassium chloride SA (K-DUR,KLOR-CON) 20 MEQ tablet, Take 1 tablet (20 mEq total) by mouth daily., Disp: 90 tablet, Rfl: 3 .  simvastatin (ZOCOR) 20 MG tablet, Take 1 tablet (20 mg total) by mouth at bedtime., Disp: 90 tablet, Rfl: 3 .  timolol (BETIMOL) 0.5 % ophthalmic solution, Place 1 drop into both eyes 2 (two) times daily., Disp: , Rfl:  .  tobramycin (TOBREX) 0.3 % ophthalmic ointment, Place 1 application into the left eye as needed., Disp: , Rfl:  .  UNABLE TO FIND, 174.9 Left mastectomy   L8030-Silicone Breast Prosthesis-1, Disp: 1 each, Rfl: 0  EXAM:  Vitals:   09/06/17 0951  BP: 120/80  Pulse: (!) 57  Temp: 97.6 F (36.4 C)    Body mass index is 22.22 kg/m.  GENERAL: vitals reviewed and listed above, alert, oriented, appears well hydrated and in no acute distress  HEENT: atraumatic, conjunttiva clear, no obvious abnormalities on inspection of external nose and ears  NECK: no obvious masses on inspection  LUNGS: clear to auscultation bilaterally, no wheezes, rales or rhonchi, good air movement  CV: HRRR, no peripheral edema  MS: moves all extremities without noticeable abnormality  SKIN: Barely visible small circular lesion on the nose with slightly raised border  PSYCH: pleasant and  cooperative, no obvious depression or anxiety  ASSESSMENT AND PLAN:  Discussed the following assessment and plan:  Chronic kidney disease (CKD), stage II (mild)  Essential hypertension - Plan: Basic metabolic panel, CBC  Hyperlipidemia, unspecified hyperlipidemia type  Hypothyroidism, unspecified type  -labs today per orders, discussed her kidney function from the last visit -Unsure about the lesion on her nose, it is barely visible, but does have some unusual features - agree that she should have a dermatologist look at it and we provided her with  the number to call to schedule a dermatology evaluation -Lifestyle recommendations, continue current medications -Follow up in 3-4 months -Patient advised to return or notify a doctor immediately if symptoms worsen or persist or new concerns arise.  Patient Instructions  BEFORE YOU LEAVE: -PHQ9  - please put in the computer -labs -follow up: 3-4 months  We have ordered labs or studies at this visit. It can take up to 1-2 weeks for results and processing. IF results require follow up or explanation, we will call you with instructions. Clinically stable results will be released to your [facility]. If you have not heard from [place] or cannot find your results in [facility] in 2 weeks please contact our office at [phone].  If you are not yet signed up for [facility], please consider signing up.  WE NOW OFFER   [facility] FAST TRACK!!!  SAME DAY Appointments for ACUTE CARE  Such as: Sprains, Injuries, cuts, abrasions, rashes, muscle pain, joint pain, back pain Colds, flu, sore throats, headache, allergies, cough, fever  Ear pain, sinus and eye infections Abdominal pain, nausea, vomiting, diarrhea, upset stomach Animal/insect bites  3 Easy Ways to Schedule: Walk-In Scheduling Call in scheduling Mychart Sign-up: [email or web address]                 Colin Benton R., DO

## 2017-09-06 ENCOUNTER — Encounter: Payer: Self-pay | Admitting: Family Medicine

## 2017-09-06 ENCOUNTER — Ambulatory Visit (INDEPENDENT_AMBULATORY_CARE_PROVIDER_SITE_OTHER): Payer: PPO | Admitting: Family Medicine

## 2017-09-06 VITALS — BP 120/80 | HR 57 | Temp 97.6°F | Ht 61.0 in | Wt 117.6 lb

## 2017-09-06 DIAGNOSIS — E039 Hypothyroidism, unspecified: Secondary | ICD-10-CM | POA: Diagnosis not present

## 2017-09-06 DIAGNOSIS — I10 Essential (primary) hypertension: Secondary | ICD-10-CM | POA: Diagnosis not present

## 2017-09-06 DIAGNOSIS — N182 Chronic kidney disease, stage 2 (mild): Secondary | ICD-10-CM

## 2017-09-06 DIAGNOSIS — E785 Hyperlipidemia, unspecified: Secondary | ICD-10-CM | POA: Diagnosis not present

## 2017-09-06 LAB — BASIC METABOLIC PANEL
BUN: 26 mg/dL — AB (ref 6–23)
CALCIUM: 9.7 mg/dL (ref 8.4–10.5)
CO2: 30 mEq/L (ref 19–32)
Chloride: 101 mEq/L (ref 96–112)
Creatinine, Ser: 1.59 mg/dL — ABNORMAL HIGH (ref 0.40–1.20)
GFR: 32.49 mL/min — AB (ref >60.00)
GLUCOSE: 101 mg/dL — AB (ref 70–99)
POTASSIUM: 4 meq/L (ref 3.5–5.1)
SODIUM: 141 meq/L (ref 135–145)

## 2017-09-06 LAB — CBC
HCT: 38 % (ref 36.0–46.0)
Hemoglobin: 12.7 g/dL (ref 12.0–15.0)
MCHC: 33.4 g/dL (ref 30.0–36.0)
MCV: 94.2 fl (ref 78.0–100.0)
Platelets: 267 10*3/uL (ref 150.0–400.0)
RBC: 4.03 Mil/uL (ref 3.87–5.11)
RDW: 14.7 % (ref 11.5–15.5)
WBC: 4.6 10*3/uL (ref 4.0–10.5)

## 2017-09-06 NOTE — Patient Instructions (Addendum)
BEFORE YOU LEAVE: -PHQ9  - please put in the computer -labs -follow up: 3-4 months  We have ordered labs or studies at this visit. It can take up to 1-2 weeks for results and processing. IF results require follow up or explanation, we will call you with instructions. Clinically stable results will be released to your [facility]. If you have not heard from [place] or cannot find your results in [facility] in 2 weeks please contact our office at [phone].  If you are not yet signed up for [facility], please consider signing up.  WE NOW OFFER   [facility] FAST TRACK!!!  SAME DAY Appointments for ACUTE CARE  Such as: Sprains, Injuries, cuts, abrasions, rashes, muscle pain, joint pain, back pain Colds, flu, sore throats, headache, allergies, cough, fever  Ear pain, sinus and eye infections Abdominal pain, nausea, vomiting, diarrhea, upset stomach Animal/insect bites  3 Easy Ways to Schedule: Walk-In Scheduling Call in scheduling Mychart Sign-up: [email or web address]

## 2017-09-29 ENCOUNTER — Other Ambulatory Visit: Payer: Self-pay | Admitting: Family Medicine

## 2017-11-10 ENCOUNTER — Telehealth: Payer: Self-pay | Admitting: Family Medicine

## 2017-11-10 MED ORDER — FLUVOXAMINE MALEATE 100 MG PO TABS: 100.0000 mg | ORAL_TABLET | Freq: Every day | ORAL | 1 refills | Status: DC

## 2017-11-10 NOTE — Telephone Encounter (Signed)
Copied from [place] [id number]. Topic: Quick Communication - See Telephone Encounter >> Nov 10, 2017 10:17 AM Ahmed Prima L wrote: CRM for notification. See Telephone encounter for:   11/10/17.  Pt is requesting LUVOX 100mg , please send to [place]. Pt has about 15 pills left.

## 2017-12-08 DIAGNOSIS — R350 Frequency of micturition: Secondary | ICD-10-CM | POA: Diagnosis not present

## 2017-12-08 DIAGNOSIS — N3946 Mixed incontinence: Secondary | ICD-10-CM | POA: Diagnosis not present

## 2017-12-13 DIAGNOSIS — H90A32 Mixed conductive and sensorineural hearing loss, unilateral, left ear with restricted hearing on the contralateral side: Secondary | ICD-10-CM | POA: Diagnosis not present

## 2017-12-13 DIAGNOSIS — H90A21 Sensorineural hearing loss, unilateral, right ear, with restricted hearing on the contralateral side: Secondary | ICD-10-CM | POA: Diagnosis not present

## 2017-12-15 DIAGNOSIS — H3582 Retinal ischemia: Secondary | ICD-10-CM | POA: Diagnosis not present

## 2017-12-15 DIAGNOSIS — H348312 Tributary (branch) retinal vein occlusion, right eye, stable: Secondary | ICD-10-CM | POA: Diagnosis not present

## 2017-12-15 DIAGNOSIS — H35371 Puckering of macula, right eye: Secondary | ICD-10-CM | POA: Diagnosis not present

## 2017-12-15 DIAGNOSIS — H34832 Tributary (branch) retinal vein occlusion, left eye, with macular edema: Secondary | ICD-10-CM | POA: Diagnosis not present

## 2018-01-07 ENCOUNTER — Ambulatory Visit: Payer: PPO | Admitting: Family Medicine

## 2018-01-09 NOTE — Progress Notes (Signed)
HPI:  Alison Cordova is a pleasant 84 y.o. here for follow up. Chronic medical problems summarized below were reviewed for changes and stability and were updated as needed below. These issues and their treatment remain stable for the most part.  Daughter reports she is doing well.  Patient does not have her hearing aids in today.  Daughter reports they are working to get her new hearing aids.  No new complaints.  Feels like her mood has been good. Denies CP, SOB, DOE, treatment intolerance or new symptoms. Due for thyroid check She continues to decline any type of breast exam or mammogram.  AWV 09/06/2017 HTN/hx CVA/CKD: -meds:amlodipine, asa, atenolol-chlorthalidone, lisinopril  Hypothyroid:  -med: levothyroxine  HLD/Obesity: -on statin  OAB: -meds:  myrbetriq -she gets samples from her urologist  Anxiety and Depression: -saw psychiatrist and was stable on the same medications for many many years and now no longer sees psychiatrist -meds: nortriptyline, fluvoxamine  GERD: -meds: pepcid  Hx breast Ca: -with breast prosthesis   ROS: See pertinent positives and negatives per HPI.  Past Medical History:  Diagnosis Date  . Allergic rhinitis   . Anemia 06/17/2012  . Anxiety and depression   . Cataract 06/17/2012  . Chronic kidney disease   . CVA (cerebral vascular accident) ([place])   . Diverticulosis 03/14/2008   Qualifier: Diagnosis of  By: Lenna Gilford MD, Deborra Medina   . Glaucoma   . Hemorrhoids 07/03/2011  . Hx of breast cancer    Left  . Hyperlipemia   . Hyperlipidemia   . Hypertension   . Hypothyroid   . Irritable bowel syndrome 09/17/2007   Qualifier: Diagnosis of  By: Rosana Hoes CMA, Tammy    . Lung nodule 07/06/2011  . Peripheral neuropathy   . Urinary incontinence     Past Surgical History:  Procedure Laterality Date  .  FUNDIPLICATION    . BREAST SURGERY     left breast mastectomy   . EYE SURGERY     TO DESOLVE AN AREA IN HER LEFT EYE?  . SPINE SURGERY    .  VAGINAL HYSTERECTOMY      History reviewed. No pertinent family history.  Social History   Socioeconomic History  . Marital status: Widowed    Spouse name: None  . Number of children: None  . Years of education: None  . Highest education level: None  Social Needs  . Financial resource strain: None  . Food insecurity - worry: None  . Food insecurity - inability: None  . Transportation needs - medical: None  . Transportation needs - non-medical: None  Occupational History  . None  Tobacco Use  . Smoking status: Former Smoker    Types: Cigarettes    Last attempt to quit: 11/23/1962    Years since quitting: 55.1  . Smokeless tobacco: Never Used  Substance and Sexual Activity  . Alcohol use: Yes    Comment: PT STATES DRINKS A BEER OR WINE OCCASSIONALLY, NOT WEEKLY  . Drug use: No  . Sexual activity: None  Other Topics Concern  . None  Social History Narrative   Work or School: none      Home Situation: lives with daughter and son and law      Spiritual Beliefs: [employer]      Lifestyle: no regular exercise; diet is ok              Current Outpatient Medications:  .  acetaminophen (TYLENOL) 650 MG CR tablet, Take 650 mg  by mouth every 8 (eight) hours as needed.  , Disp: , Rfl:  .  amLODipine (NORVASC) 2.5 MG tablet, TAKE 1 TABLET BY MOUTH EVERY DAY, Disp: 90 tablet, Rfl: 2 .  aspirin 325 MG tablet, Take 325 mg by mouth daily.  , Disp: , Rfl:  .  atenolol-chlorthalidone (TENORETIC) 50-25 MG tablet, Take 1 tablet by mouth daily., Disp: 90 tablet, Rfl: 3 .  cetirizine (ZYRTEC) 10 MG tablet, Take 1 tablet (10 mg total) by mouth daily., Disp: 90 tablet, Rfl: 3 .  famotidine (PEPCID) 20 MG tablet, Take 1 tablet (20 mg total) by mouth 2 (two) times daily as needed., Disp: 180 tablet, Rfl: 3 .  fish oil-omega-3 fatty acids 1000 MG capsule, Take 2 g by mouth daily.  , Disp: , Rfl:  .  fluocinonide cream (LIDEX) 0.05 %, Apply topically 2 (two) times daily., Disp: 90 g,  Rfl: 3 .  fluticasone (FLONASE) 50 MCG/ACT nasal spray, Place 2 sprays into both nostrils daily., Disp: 16 g, Rfl: 3 .  fluvoxaMINE (LUVOX) 100 MG tablet, Take 1 tablet (100 mg total) by mouth daily., Disp: 90 tablet, Rfl: 1 .  latanoprost (XALATAN) 0.005 % ophthalmic solution, Place 1 drop into both eyes at bedtime.  , Disp: , Rfl:  .  levothyroxine (SYNTHROID, LEVOTHROID) 50 MCG tablet, Take 1 tablet (50 mcg total) by mouth daily., Disp: 90 tablet, Rfl: 3 .  lisinopril (PRINIVIL,ZESTRIL) 20 MG tablet, Take 1 tablet (20 mg total) by mouth daily., Disp: 90 tablet, Rfl: 3 .  mirabegron ER (MYRBETRIQ) 50 MG TB24 tablet, Take 50 mg by mouth every other day., Disp: , Rfl:  .  Multiple Vitamins-Minerals (WOMENS MULTIVITAMIN PLUS PO), Take 1 tablet by mouth daily.  , Disp: , Rfl:  .  NON FORMULARY, Post Surgical Bras, Disp: , Rfl:  .  NON FORMULARY, Non-Silicone Breast Prosthesis, Disp: , Rfl:  .  NON FORMULARY, Silicone Breast Prosthesis, Disp: , Rfl:  .  nortriptyline (PAMELOR) 10 MG capsule, Take 1 capsule (10 mg total) by mouth at bedtime. (Patient taking differently: Take 30 mg by mouth at bedtime. ), Disp: 90 capsule, Rfl: 3 .  potassium chloride SA (K-DUR,KLOR-CON) 20 MEQ tablet, Take 1 tablet (20 mEq total) by mouth daily., Disp: 90 tablet, Rfl: 3 .  simvastatin (ZOCOR) 20 MG tablet, Take 1 tablet (20 mg total) by mouth at bedtime., Disp: 90 tablet, Rfl: 3 .  timolol (BETIMOL) 0.5 % ophthalmic solution, Place 1 drop into both eyes 2 (two) times daily., Disp: , Rfl:  .  tobramycin (TOBREX) 0.3 % ophthalmic ointment, Place 1 application into the left eye as needed., Disp: , Rfl:  .  UNABLE TO FIND, 174.9 Left mastectomy   L8030-Silicone Breast Prosthesis-1, Disp: 1 each, Rfl: 0  EXAM:  Vitals:   01/11/18 0921  BP: 126/64  Pulse: (!) 57  Temp: 97.8 F (36.6 C)    Body mass index is 22.28 kg/m.  GENERAL: vitals reviewed and listed above, alert, oriented, appears well hydrated and in no  acute distress  HEENT: atraumatic, conjunttiva clear, no obvious abnormalities on inspection of external nose and ears  NECK: no obvious masses on inspection  LUNGS: clear to auscultation bilaterally, no wheezes, rales or rhonchi, good air movement  CV: HRRR, no peripheral edema  MS: moves all extremities without noticeable abnormality  PSYCH: pleasant and cooperative, no obvious depression or anxiety  ASSESSMENT AND PLAN:  Discussed the following assessment and plan:  Essential hypertension - Plan: Basic  metabolic panel, CBC  Hyperlipidemia, unspecified hyperlipidemia type  Chronic kidney disease (CKD), stage II (mild)  Hypothyroidism, unspecified type - Plan: TSH  Recurrent major depressive disorder, in full remission ([place])  -Labs today per orders -Encouraged her to try to continue to get a little bit of activity daily even if it is chair exercises with a video in her living room -Mood assessment -Reviewed medications with daughter and patient to ensure she is taking properly -Follow-up in 3-4 months  Patient Instructions  BEFORE YOU LEAVE: -PHQ 9 -Labs -follow up: Follow-up 3-4 months  Try to get a little bit of exercise every day even if it is a short chair exercise workout.  We have ordered labs or studies at this visit. It can take up to 1-2 weeks for results and processing. IF results require follow up or explanation, we will call you with instructions. Clinically stable results will be released to your [facility]. If you have not heard from [place] or cannot find your results in [facility] in 2 weeks please contact our office at [phone].  If you are not yet signed up for [facility], please consider signing up.           Lucretia Kern, DO

## 2018-01-11 ENCOUNTER — Encounter: Payer: Self-pay | Admitting: Family Medicine

## 2018-01-11 ENCOUNTER — Ambulatory Visit (INDEPENDENT_AMBULATORY_CARE_PROVIDER_SITE_OTHER): Payer: PPO | Admitting: Family Medicine

## 2018-01-11 VITALS — BP 126/64 | HR 57 | Temp 97.8°F | Ht 61.0 in | Wt 117.9 lb

## 2018-01-11 DIAGNOSIS — D649 Anemia, unspecified: Secondary | ICD-10-CM

## 2018-01-11 DIAGNOSIS — N182 Chronic kidney disease, stage 2 (mild): Secondary | ICD-10-CM | POA: Diagnosis not present

## 2018-01-11 DIAGNOSIS — F3342 Major depressive disorder, recurrent, in full remission: Secondary | ICD-10-CM

## 2018-01-11 DIAGNOSIS — E039 Hypothyroidism, unspecified: Secondary | ICD-10-CM

## 2018-01-11 DIAGNOSIS — E785 Hyperlipidemia, unspecified: Secondary | ICD-10-CM | POA: Diagnosis not present

## 2018-01-11 DIAGNOSIS — I10 Essential (primary) hypertension: Secondary | ICD-10-CM

## 2018-01-11 LAB — BASIC METABOLIC PANEL
BUN: 22 mg/dL (ref 6–23)
CHLORIDE: 102 meq/L (ref 96–112)
CO2: 33 mEq/L — ABNORMAL HIGH (ref 19–32)
Calcium: 10.1 mg/dL (ref 8.4–10.5)
Creatinine, Ser: 1.4 mg/dL — ABNORMAL HIGH (ref 0.40–1.20)
GFR: 37.6 mL/min — ABNORMAL LOW (ref >60.00)
Glucose, Bld: 97 mg/dL (ref 70–99)
POTASSIUM: 4.3 meq/L (ref 3.5–5.1)
SODIUM: 141 meq/L (ref 135–145)

## 2018-01-11 LAB — CBC
HEMATOCRIT: 34 % — AB (ref 36.0–46.0)
HEMOGLOBIN: 11.4 g/dL — AB (ref 12.0–15.0)
MCHC: 33.5 g/dL (ref 30.0–36.0)
MCV: 93.4 fl (ref 78.0–100.0)
PLATELETS: 231 10*3/uL (ref 150.0–400.0)
RBC: 3.64 Mil/uL — AB (ref 3.87–5.11)
RDW: 13.7 % (ref 11.5–15.5)
WBC: 4.2 10*3/uL (ref 4.0–10.5)

## 2018-01-11 LAB — TSH: TSH: 2.12 u[IU]/mL (ref 0.35–4.50)

## 2018-01-11 NOTE — Patient Instructions (Addendum)
BEFORE YOU LEAVE: -PHQ 9 -Labs -follow up: Follow-up 3-4 months  Try to get a little bit of exercise every day even if it is a short chair exercise workout.  We have ordered labs or studies at this visit. It can take up to 1-2 weeks for results and processing. IF results require follow up or explanation, we will call you with instructions. Clinically stable results will be released to your [facility]. If you have not heard from [place] or cannot find your results in [facility] in 2 weeks please contact our office at [phone].  If you are not yet signed up for [facility], please consider signing up.        [phone]

## 2018-01-14 NOTE — Addendum Note (Signed)
Addended by: Agnes Lawrence on: 01/14/2018 02:39 PM   Modules accepted: Orders

## 2018-01-21 ENCOUNTER — Encounter: Payer: Self-pay | Admitting: Family Medicine

## 2018-01-21 ENCOUNTER — Ambulatory Visit (INDEPENDENT_AMBULATORY_CARE_PROVIDER_SITE_OTHER): Payer: PPO | Admitting: Family Medicine

## 2018-01-21 VITALS — BP 120/80 | HR 78 | Temp 97.4°F | Ht 61.0 in | Wt 113.8 lb

## 2018-01-21 DIAGNOSIS — J209 Acute bronchitis, unspecified: Secondary | ICD-10-CM | POA: Diagnosis not present

## 2018-01-21 MED ORDER — AZITHROMYCIN 250 MG PO TABS: ORAL_TABLET | ORAL | 0 refills | Status: DC

## 2018-01-21 NOTE — Progress Notes (Signed)
   Subjective:    Patient ID: Alison Cordova, female    DOB: 03-17-1926, 84 y.o.   MRN: [id number]  HPI Here with her daughter for one week of chest congestion and a dry cough. No fever. On Tylenol.    Review of Systems  Constitutional: Negative.   HENT: Negative.   Eyes: Negative.   Respiratory: Positive for cough and chest tightness.        Objective:   Physical Exam  Constitutional: She appears well-developed and well-nourished.  HENT:  Right Ear: External ear normal.  Left Ear: External ear normal.  Nose: Nose normal.  Mouth/Throat: Oropharynx is clear and moist.  Eyes: Conjunctivae are normal.  Neck: No thyromegaly present.  Pulmonary/Chest: Effort normal. She has no rales.  Scattered rhonchi   Lymphadenopathy:    She has no cervical adenopathy.          Assessment & Plan:  Bronchitis, treat with a Zpack.  Alysia Penna, MD

## 2018-02-11 ENCOUNTER — Telehealth: Payer: Self-pay | Admitting: *Deleted

## 2018-02-11 NOTE — Telephone Encounter (Signed)
I called the pts daughter and she stated she is having a hard time getting enough for the third day as she has completed two days already.  I advised her this should be fine as long as she is following the guidelines such as no red meat,etc and to complete all three if she can and bring these in. She also stated the pt is taking aspirin and did not stop this due to the pts history of a clot.

## 2018-02-11 NOTE — Telephone Encounter (Signed)
Copied from [place]. Topic: Inquiry >> Feb 11, 2018 11:14 AM Arletha Grippe wrote: Reason for CRM: daughter [name] called - she has not been able to catch pt to get a stool sample, and want to make sure not having those results will affect the results. Cb [phone]

## 2018-02-14 ENCOUNTER — Ambulatory Visit (INDEPENDENT_AMBULATORY_CARE_PROVIDER_SITE_OTHER): Payer: PPO

## 2018-02-14 DIAGNOSIS — D649 Anemia, unspecified: Secondary | ICD-10-CM | POA: Diagnosis not present

## 2018-02-14 LAB — POC HEMOCCULT BLD/STL (HOME/3-CARD/SCREEN)
FECAL OCCULT BLD: NEGATIVE
FECAL OCCULT BLD: NEGATIVE
Fecal Occult Blood, POC: NEGATIVE

## 2018-02-14 NOTE — Addendum Note (Signed)
Addended by: Tomi Likens on: 02/14/2018 09:25 AM   Modules accepted: Orders

## 2018-02-15 DIAGNOSIS — H353132 Nonexudative age-related macular degeneration, bilateral, intermediate dry stage: Secondary | ICD-10-CM | POA: Diagnosis not present

## 2018-02-15 DIAGNOSIS — H34832 Tributary (branch) retinal vein occlusion, left eye, with macular edema: Secondary | ICD-10-CM | POA: Diagnosis not present

## 2018-02-15 DIAGNOSIS — H3582 Retinal ischemia: Secondary | ICD-10-CM | POA: Diagnosis not present

## 2018-02-15 DIAGNOSIS — H348312 Tributary (branch) retinal vein occlusion, right eye, stable: Secondary | ICD-10-CM | POA: Diagnosis not present

## 2018-02-16 LAB — ANEMIA PANEL
Ferritin: 265 ng/mL — ABNORMAL HIGH (ref 15–150)
HEMATOCRIT: 34.8 % (ref 34.0–46.6)
Iron Saturation: 33 % (ref 15–55)
Iron: 96 ug/dL (ref 27–139)
RETIC CT PCT: 1.9 % (ref 0.6–2.6)
TIBC: 294 ug/dL (ref 250–450)
UIBC: 198 ug/dL (ref 118–369)
Vitamin B-12: 440 pg/mL (ref 232–1245)

## 2018-02-16 LAB — SPECIMEN STATUS REPORT

## 2018-02-17 ENCOUNTER — Telehealth: Payer: Self-pay | Admitting: Family Medicine

## 2018-02-17 NOTE — Telephone Encounter (Signed)
Copied from [place] [id number]. Topic: Quick Communication - Lab Results >> Feb 17, 2018  2:42 PM Burnis Medin, NT wrote: Patient daughter called and wanted to see if patient's lab results were in and would like a call back.

## 2018-02-18 NOTE — Telephone Encounter (Signed)
Hemoccults were negative and anemia panel no iron deficiency or B12 deficiency.  Only abnormality was high ferritin- and this is very nonspecific and can be an "acute phase reactant" (elevated with various causes of inflammation).

## 2018-02-18 NOTE — Telephone Encounter (Signed)
I called the pt and informed her daughter of the message below.  She asked what to do at this point and a message was sent to Dr Maudie Mercury for further recommendations.

## 2018-02-21 NOTE — Telephone Encounter (Signed)
See result note. Recommend healthy diet and repeat cbc in about 1-2 months.

## 2018-02-21 NOTE — Telephone Encounter (Signed)
I called the pts daughter and informed her of the message below.  Lab appt scheduled for 6/3.

## 2018-02-24 ENCOUNTER — Other Ambulatory Visit: Payer: Self-pay | Admitting: Family Medicine

## 2018-04-04 ENCOUNTER — Telehealth: Payer: Self-pay | Admitting: Family Medicine

## 2018-04-04 NOTE — Telephone Encounter (Signed)
Copied from [place] [phone] [phone] [id number]. Topic: Quick Communication - Rx Refill/Question >> Apr 04, 2018 10:22 AM Carolyn Stare wrote: Medication  nortriptyline (PAMELOR) 10 MG capsule  Preferred Pharmacy  [facility]  Agent: Please be advised that RX refills may take up to 3 business days. We ask that you follow-up with your pharmacy.

## 2018-04-04 NOTE — Telephone Encounter (Signed)
It looks like this was last refilled several years ago by another provider. Please check with daughter and pt to ensure she is still taking and see where she last got rx. Then verify dose, etc with pharmacy. If is taking on regular basis and confirmed with pt and pharmacy...ok to refill for 3 months. If not able to verify taking, then advise appt. Thanks.

## 2018-04-05 MED ORDER — NORTRIPTYLINE HCL 10 MG PO CAPS: 10.0000 mg | ORAL_CAPSULE | Freq: Every day | ORAL | 0 refills | Status: DC

## 2018-04-05 NOTE — Telephone Encounter (Signed)
I called the pts daughter and she stated the pt is taking this once a day at bedtime, Rx was given initially by the psychiatrist the pt was seeing previously, he released her and advised this be given by Dr Maudie Mercury.  Rx sent for a 3 month supply via escribe to [facility].

## 2018-04-13 DIAGNOSIS — N3946 Mixed incontinence: Secondary | ICD-10-CM | POA: Diagnosis not present

## 2018-04-13 DIAGNOSIS — R350 Frequency of micturition: Secondary | ICD-10-CM | POA: Diagnosis not present

## 2018-04-19 DIAGNOSIS — H3582 Retinal ischemia: Secondary | ICD-10-CM | POA: Diagnosis not present

## 2018-04-19 DIAGNOSIS — H348312 Tributary (branch) retinal vein occlusion, right eye, stable: Secondary | ICD-10-CM | POA: Diagnosis not present

## 2018-04-19 DIAGNOSIS — H34832 Tributary (branch) retinal vein occlusion, left eye, with macular edema: Secondary | ICD-10-CM | POA: Diagnosis not present

## 2018-04-19 DIAGNOSIS — H353132 Nonexudative age-related macular degeneration, bilateral, intermediate dry stage: Secondary | ICD-10-CM | POA: Diagnosis not present

## 2018-04-25 ENCOUNTER — Other Ambulatory Visit: Payer: Self-pay | Admitting: *Deleted

## 2018-04-25 ENCOUNTER — Other Ambulatory Visit (INDEPENDENT_AMBULATORY_CARE_PROVIDER_SITE_OTHER): Payer: PPO

## 2018-04-25 DIAGNOSIS — D649 Anemia, unspecified: Secondary | ICD-10-CM | POA: Diagnosis not present

## 2018-04-25 LAB — CBC WITH DIFFERENTIAL/PLATELET
Basophils Absolute: 0 10*3/uL (ref 0.0–0.1)
Basophils Relative: 0.2 % (ref 0.0–3.0)
EOS ABS: 0 10*3/uL (ref 0.0–0.7)
Eosinophils Relative: 0.1 % (ref 0.0–5.0)
HCT: 36 % (ref 36.0–46.0)
HEMOGLOBIN: 12.2 g/dL (ref 12.0–15.0)
Lymphocytes Relative: 16 % (ref 12.0–46.0)
Lymphs Abs: 1.4 10*3/uL (ref 0.7–4.0)
MCHC: 33.8 g/dL (ref 30.0–36.0)
MCV: 92.5 fl (ref 78.0–100.0)
MONO ABS: 1.5 10*3/uL — AB (ref 0.1–1.0)
Monocytes Relative: 16.5 % — ABNORMAL HIGH (ref 3.0–12.0)
NEUTROS PCT: 67.2 % (ref 43.0–77.0)
Neutro Abs: 6 10*3/uL (ref 1.4–7.7)
Platelets: 222 10*3/uL (ref 150.0–400.0)
RBC: 3.89 Mil/uL (ref 3.87–5.11)
RDW: 14.2 % (ref 11.5–15.5)
WBC: 9 10*3/uL (ref 4.0–10.5)

## 2018-05-10 NOTE — Progress Notes (Signed)
HPI:  Using dictation device. Unfortunately this device frequently misinterprets words/phrases.  Alison Cordova is a pleasant 84 y.o. here for follow up. Chronic medical problems summarized below were reviewed for changes and stability and were updated as needed below. These issues and their treatment remain stable for the most part. . Denies CP, SOB, DOE, treatment intolerance or new symptoms. Unfortunately, has had an URI the last 2 days. Symptoms include nasal congestion, PND and cough. Denies fevers, malaise, SOB, NV, body aches or rash. Has had sore foot, R medial foot x 1-2 weeks. Was red initially and now improving. Caregiver reports thought may have been a insect bite. Now resolving.  AWV 09/06/2017 HTN/hx CVA/CKD: -meds:amlodipine, asa, atenolol-chlorthalidone, lisinopril  Hypothyroid:  -med: levothyroxine  HLD/Obesity: -on statin  OAB: -meds:  myrbetriq -she gets samples from her urologist  Anxiety and Depression: -saw psychiatrist and was stable on the same medications for many many years and now no longer sees psychiatrist -meds: nortriptyline, fluvoxamine  GERD: -meds: pepcid  Hx breast Ca: -with breast prosthesis   ROS: See pertinent positives and negatives per HPI.  Past Medical History:  Diagnosis Date  . Allergic rhinitis   . Anemia 06/17/2012  . Anxiety and depression   . Cataract 06/17/2012  . Chronic kidney disease   . CVA (cerebral vascular accident) ([place])   . Diverticulosis 03/14/2008   Qualifier: Diagnosis of  By: Lenna Gilford MD, Deborra Medina   . Glaucoma   . Hemorrhoids 07/03/2011  . Hx of breast cancer    Left  . Hyperlipemia   . Hyperlipidemia   . Hypertension   . Hypothyroid   . Irritable bowel syndrome 09/17/2007   Qualifier: Diagnosis of  By: Rosana Hoes CMA, Tammy    . Lung nodule 07/06/2011  . Peripheral neuropathy   . Urinary incontinence     Past Surgical History:  Procedure Laterality Date  .  FUNDIPLICATION    . BREAST SURGERY     left  breast mastectomy   . EYE SURGERY     TO DESOLVE AN AREA IN HER LEFT EYE?  . SPINE SURGERY    . VAGINAL HYSTERECTOMY      No family history on file.  SOCIAL HX: see hpi   Current Outpatient Medications:  .  acetaminophen (TYLENOL) 650 MG CR tablet, Take 650 mg by mouth every 8 (eight) hours as needed.  , Disp: , Rfl:  .  amLODipine (NORVASC) 2.5 MG tablet, TAKE 1 TABLET BY MOUTH EVERY DAY, Disp: 90 tablet, Rfl: 2 .  aspirin 325 MG tablet, Take 325 mg by mouth daily.  , Disp: , Rfl:  .  atenolol-chlorthalidone (TENORETIC) 50-25 MG tablet, Take 1 tablet by mouth daily., Disp: 90 tablet, Rfl: 3 .  azithromycin (ZITHROMAX Z-PAK) 250 MG tablet, As directed, Disp: 6 each, Rfl: 0 .  cetirizine (ZYRTEC) 10 MG tablet, Take 1 tablet (10 mg total) by mouth daily., Disp: 90 tablet, Rfl: 3 .  famotidine (PEPCID) 20 MG tablet, Take 1 tablet (20 mg total) by mouth 2 (two) times daily as needed., Disp: 180 tablet, Rfl: 3 .  fish oil-omega-3 fatty acids 1000 MG capsule, Take 2 g by mouth daily.  , Disp: , Rfl:  .  fluocinonide cream (LIDEX) 0.05 %, Apply topically 2 (two) times daily., Disp: 90 g, Rfl: 3 .  fluticasone (FLONASE) 50 MCG/ACT nasal spray, Place 2 sprays into both nostrils daily., Disp: 16 g, Rfl: 3 .  fluvoxaMINE (LUVOX) 100 MG tablet, Take 1 tablet (  100 mg total) by mouth daily., Disp: 90 tablet, Rfl: 1 .  latanoprost (XALATAN) 0.005 % ophthalmic solution, Place 1 drop into both eyes at bedtime.  , Disp: , Rfl:  .  levothyroxine (SYNTHROID, LEVOTHROID) 50 MCG tablet, Take 1 tablet by mouth daily, Disp: 90 tablet, Rfl: 2 .  lisinopril (PRINIVIL,ZESTRIL) 20 MG tablet, Take 1 tablet (20 mg total) by mouth daily., Disp: 90 tablet, Rfl: 3 .  mirabegron ER (MYRBETRIQ) 50 MG TB24 tablet, Take 50 mg by mouth every other day., Disp: , Rfl:  .  Multiple Vitamins-Minerals (WOMENS MULTIVITAMIN PLUS PO), Take 1 tablet by mouth daily.  , Disp: , Rfl:  .  NON FORMULARY, Post Surgical Bras, Disp: , Rfl:   .  NON FORMULARY, Non-Silicone Breast Prosthesis, Disp: , Rfl:  .  NON FORMULARY, Silicone Breast Prosthesis, Disp: , Rfl:  .  nortriptyline (PAMELOR) 10 MG capsule, Take 1 capsule (10 mg total) by mouth at bedtime. (Patient taking differently: Take 30 mg by mouth at bedtime. ), Disp: 90 capsule, Rfl: 3 .  nortriptyline (PAMELOR) 10 MG capsule, Take 1 capsule (10 mg total) by mouth at bedtime., Disp: 90 capsule, Rfl: 0 .  potassium chloride SA (K-DUR,KLOR-CON) 20 MEQ tablet, Take 1 tablet (20 mEq total) by mouth daily., Disp: 90 tablet, Rfl: 3 .  simvastatin (ZOCOR) 20 MG tablet, Take 1 tablet (20 mg total) by mouth at bedtime., Disp: 90 tablet, Rfl: 3 .  timolol (BETIMOL) 0.5 % ophthalmic solution, Place 1 drop into both eyes 2 (two) times daily., Disp: , Rfl:  .  tobramycin (TOBREX) 0.3 % ophthalmic ointment, Place 1 application into the left eye as needed., Disp: , Rfl:  .  UNABLE TO FIND, 174.9 Left mastectomy   L8030-Silicone Breast Prosthesis-1, Disp: 1 each, Rfl: 0 .  benzonatate (TESSALON) 100 MG capsule, Take 1 capsule (100 mg total) by mouth 2 (two) times daily as needed for cough., Disp: 20 capsule, Rfl: 0  EXAM:  Vitals:   05/12/18 0831  BP: (!) 148/70  Pulse: 87  Temp: 98.2 F (36.8 C)  SpO2: 96%    Body mass index is 21.33 kg/m.  GENERAL: vitals reviewed and listed above, alert, oriented, appears well hydrated and in no acute distress  HEENT: atraumatic, conjunttiva clear, no obvious abnormalities on inspection of external nose and ears, normal appearance of ear canals and TMs, clear nasal congestion, mild post oropharyngeal erythema with PND, no tonsillar edema or exudate, no sinus TTP  NECK: no obvious masses on inspection  LUNGS: clear to auscultation bilaterally, no wheezes, rales or rhonchi, good air movement  CV: HRRR, no peripheral edema  MS: moves all extremities without noticeable abnormality; mild TTP L medial ankle inferolateral to R medial malleolus,  shoe rubs here, minimal erythema  PSYCH: pleasant and cooperative, no obvious depression or anxiety  ASSESSMENT AND PLAN:  Discussed the following assessment and plan:  Upper respiratory tract infection, unspecified type  Acute right ankle pain  Hyperlipidemia, unspecified hyperlipidemia type  Chronic kidney disease (CKD), stage II (mild)  Hypothyroidism, unspecified type  Essential hypertension  -symptomatic care and return precautions for likely VURI, tessalon for cough after discussion -foot/ankle looks ok today with and they feel is improving - advised prompt follow up if redness worsens, does not resolve or other concerns -holding off on labs since has cold today, prefers to do at follow up -continue chronic meds -follow up 2-3 months, sooner as needed  Patient Instructions  BEFORE YOU LEAVE: -follow  up: 2-3 months, will plan to do labs then  For the respiratory illness: -nasal saline -cough drops -warm tea -tessalon if needed for cough -I hope you are feeling better soon! Seek care promptly if your symptoms worsen, new concerns arise or you are not improving with treatment.  For the foot: -ice twice daily -follow up promptly if not continuing to resolve over next few days   Lucretia Kern, DO

## 2018-05-12 ENCOUNTER — Telehealth: Payer: Self-pay | Admitting: Family Medicine

## 2018-05-12 ENCOUNTER — Ambulatory Visit (INDEPENDENT_AMBULATORY_CARE_PROVIDER_SITE_OTHER): Payer: PPO | Admitting: Family Medicine

## 2018-05-12 VITALS — BP 148/70 | HR 87 | Temp 98.2°F | Wt 112.9 lb

## 2018-05-12 DIAGNOSIS — I10 Essential (primary) hypertension: Secondary | ICD-10-CM

## 2018-05-12 DIAGNOSIS — N182 Chronic kidney disease, stage 2 (mild): Secondary | ICD-10-CM

## 2018-05-12 DIAGNOSIS — J069 Acute upper respiratory infection, unspecified: Secondary | ICD-10-CM

## 2018-05-12 DIAGNOSIS — E039 Hypothyroidism, unspecified: Secondary | ICD-10-CM

## 2018-05-12 DIAGNOSIS — E785 Hyperlipidemia, unspecified: Secondary | ICD-10-CM | POA: Diagnosis not present

## 2018-05-12 DIAGNOSIS — M25571 Pain in right ankle and joints of right foot: Secondary | ICD-10-CM | POA: Diagnosis not present

## 2018-05-12 MED ORDER — BENZONATATE 100 MG PO CAPS: 100.0000 mg | ORAL_CAPSULE | Freq: Two times a day (BID) | ORAL | 0 refills | Status: DC | PRN

## 2018-05-12 NOTE — Telephone Encounter (Signed)
Medication filled to pharmacy as requested.  Medication canceled at [facility].

## 2018-05-12 NOTE — Patient Instructions (Signed)
BEFORE YOU LEAVE: -follow up: 2-3 months, will plan to do labs then  For the respiratory illness: -nasal saline -cough drops -warm tea -tessalon if needed for cough -I hope you are feeling better soon! Seek care promptly if your symptoms worsen, new concerns arise or you are not improving with treatment.  For the foot: -ice twice daily -follow up promptly if not continuing to resolve over next few days

## 2018-05-12 NOTE — Telephone Encounter (Signed)
Copied from [place] [id number]. Topic: Quick Communication - Rx Refill/Question >> May 12, 2018 10:11 AM Scherrie Gerlach wrote: Medication: benzonatate (TESSALON) 100 MG capsule Pt was seen this am and her cough med sent to mail order Please resend to local pharmacy  [facility] [address], [place], [place] [address]   Phone: [phone]

## 2018-05-16 ENCOUNTER — Telehealth: Payer: Self-pay | Admitting: Family Medicine

## 2018-05-16 MED ORDER — ATENOLOL-CHLORTHALIDONE 50-25 MG PO TABS: 1.0000 | ORAL_TABLET | Freq: Every day | ORAL | 1 refills | Status: DC

## 2018-05-16 NOTE — Telephone Encounter (Signed)
Copied from [place] [id number]. Topic: Inquiry >> May 16, 2018  9:04 AM Pricilla Handler wrote: Reason for CRM: Patient's daughter [name] called requesting a refill of Atenolol-chlorthalidone (TENORETIC) 50-25 MG tablet for the patient. Patient's preferred pharmacy is [facility] - [place], [place] [phone] (Phone) [phone] (Fax).        Thank You!!!

## 2018-05-31 ENCOUNTER — Telehealth: Payer: Self-pay | Admitting: *Deleted

## 2018-05-31 NOTE — Telephone Encounter (Signed)
Noted  

## 2018-06-20 ENCOUNTER — Telehealth: Payer: Self-pay | Admitting: Family Medicine

## 2018-06-20 MED ORDER — SIMVASTATIN 20 MG PO TABS: 20.0000 mg | ORAL_TABLET | Freq: Every day | ORAL | 3 refills | Status: DC

## 2018-06-20 MED ORDER — FLUVOXAMINE MALEATE 100 MG PO TABS: 100.0000 mg | ORAL_TABLET | Freq: Every day | ORAL | 1 refills | Status: DC

## 2018-06-20 NOTE — Telephone Encounter (Signed)
Copied from [place] [id number]. Topic: Quick Communication - See Telephone Encounter >> Jun 20, 2018  8:59 AM Mylinda Latina, NT wrote: CRM for notification. See Telephone encounter for: 06/20/18. Patient daughter called and states the patient needs a refill of her simvastatin (ZOCOR) 20 MG tablet ,fluvoxaMINE (LUVOX) 100 MG tablet  [facility] - [place], [place] [phone] (Phone) [phone] (Fax)

## 2018-06-21 DIAGNOSIS — H35371 Puckering of macula, right eye: Secondary | ICD-10-CM | POA: Diagnosis not present

## 2018-06-21 DIAGNOSIS — H34832 Tributary (branch) retinal vein occlusion, left eye, with macular edema: Secondary | ICD-10-CM | POA: Diagnosis not present

## 2018-06-21 DIAGNOSIS — H348312 Tributary (branch) retinal vein occlusion, right eye, stable: Secondary | ICD-10-CM | POA: Diagnosis not present

## 2018-06-21 DIAGNOSIS — H353132 Nonexudative age-related macular degeneration, bilateral, intermediate dry stage: Secondary | ICD-10-CM | POA: Diagnosis not present

## 2018-07-19 ENCOUNTER — Other Ambulatory Visit: Payer: Self-pay | Admitting: Family Medicine

## 2018-08-04 ENCOUNTER — Other Ambulatory Visit: Payer: Self-pay | Admitting: Family Medicine

## 2018-08-05 NOTE — Telephone Encounter (Signed)
Please call patient. She has been on these medications for a long time, but recently asked me to refill the pamelor (nortriptyline) instead of her psychiatrist. I am ok with the refills, but did want to make her aware that there is a potential interaction between the pamelor that the psychiatrist used to rx and the mebetriq that her urologist prescribes. As long as she is aware of this, has done fine on this combination and wishes to continue then please send refills. Otherwise, recommend appointment to discuss and we can try to wean off of one of these.

## 2018-08-08 NOTE — Telephone Encounter (Signed)
The pts daughter Ms [name] called back and was informed of the message below.  She stated the pt has been doing well on both medications and she would like for her to continue this.  A refill was sent to the pharmacy and Ms [name] stated she will discuss this futher at the follow up on 9/23.

## 2018-08-08 NOTE — Telephone Encounter (Signed)
I left a message for the pt to return my call. 

## 2018-08-13 NOTE — Progress Notes (Signed)
HPI:  Using dictation device. Unfortunately this device frequently misinterprets words/phrases.  Alison Cordova is a pleasant 84 y.o. here for follow up. Chronic medical problems summarized below were reviewed for changes and stability and were updated as needed below. These issues and their treatment remain stable for the most part. Urologist started her on mebetriq- they did not discuss interaction with the nortriptyline. No issues thus far. On very low dose nortriptyline rxd by psychiatrist in the past for depression. Denies CP, SOB, DOE, treatment intolerance or new symptoms.  Due for flu vaccine, labs  AWV 09/06/2017 HTN/hx CVA/CKD: -meds:amlodipine, asa, atenolol-chlorthalidone, lisinopril  Hypothyroid:  -med: levothyroxine  HLD/Obesity: -on statin  OAB: -meds:  myrbetriq -she gets samples from her urologist  Anxiety and Depression: -saw psychiatrist and was stable on the same medications for many many years and now no longer sees psychiatrist -meds: nortriptyline, fluvoxamine  GERD: -meds: pepcid  Hx breast Ca: -with breast prosthesis  ROS: See pertinent positives and negatives per HPI.  Past Medical History:  Diagnosis Date  . Allergic rhinitis   . Anemia 06/17/2012  . Anxiety and depression   . Cataract 06/17/2012  . Chronic kidney disease   . CVA (cerebral vascular accident) ([place])   . Diverticulosis 03/14/2008   Qualifier: Diagnosis of  By: Lenna Gilford MD, Deborra Medina   . Glaucoma   . Hemorrhoids 07/03/2011  . Hx of breast cancer    Left  . Hyperlipemia   . Hyperlipidemia   . Hypertension   . Hypothyroid   . Irritable bowel syndrome 09/17/2007   Qualifier: Diagnosis of  By: Rosana Hoes CMA, Tammy    . Lung nodule 07/06/2011  . Peripheral neuropathy   . Urinary incontinence     Past Surgical History:  Procedure Laterality Date  .  FUNDIPLICATION    . BREAST SURGERY     left breast mastectomy   . EYE SURGERY     TO DESOLVE AN AREA IN HER LEFT EYE?  . SPINE  SURGERY    . VAGINAL HYSTERECTOMY      History reviewed. No pertinent family history.  SOCIAL HX: see hpi   Current Outpatient Medications:  .  acetaminophen (TYLENOL) 650 MG CR tablet, Take 650 mg by mouth every 8 (eight) hours as needed.  , Disp: , Rfl:  .  amLODipine (NORVASC) 2.5 MG tablet, TAKE 1 TABLET BY MOUTH EVERY DAY, Disp: 90 tablet, Rfl: 2 .  aspirin 325 MG tablet, Take 325 mg by mouth daily.  , Disp: , Rfl:  .  atenolol-chlorthalidone (TENORETIC) 50-25 MG tablet, Take 1 tablet by mouth daily., Disp: 90 tablet, Rfl: 1 .  cetirizine (ZYRTEC) 10 MG tablet, Take 1 tablet (10 mg total) by mouth daily., Disp: 90 tablet, Rfl: 3 .  famotidine (PEPCID) 20 MG tablet, Take 1 tablet (20 mg total) by mouth 2 (two) times daily as needed., Disp: 180 tablet, Rfl: 3 .  fluocinonide cream (LIDEX) 0.05 %, Apply topically 2 (two) times daily., Disp: 90 g, Rfl: 3 .  fluticasone (FLONASE) 50 MCG/ACT nasal spray, Place 2 sprays into both nostrils daily., Disp: 16 g, Rfl: 3 .  fluvoxaMINE (LUVOX) 100 MG tablet, Take 1 tablet (100 mg total) by mouth daily. (Patient taking differently: Take 100 mg by mouth daily as needed. ), Disp: 90 tablet, Rfl: 1 .  latanoprost (XALATAN) 0.005 % ophthalmic solution, Place 1 drop into both eyes at bedtime.  , Disp: , Rfl:  .  levothyroxine (SYNTHROID, LEVOTHROID) 50 MCG tablet,  Take 1 tablet by mouth daily, Disp: 90 tablet, Rfl: 1 .  lisinopril (PRINIVIL,ZESTRIL) 20 MG tablet, Take 1 tablet by mouth daily, Disp: 90 tablet, Rfl: 1 .  mirabegron ER (MYRBETRIQ) 50 MG TB24 tablet, Take 50 mg by mouth every other day., Disp: , Rfl:  .  Multiple Vitamins-Minerals (WOMENS MULTIVITAMIN PLUS PO), Take 1 tablet by mouth daily.  , Disp: , Rfl:  .  NON FORMULARY, Post Surgical Bras, Disp: , Rfl:  .  NON FORMULARY, Non-Silicone Breast Prosthesis, Disp: , Rfl:  .  NON FORMULARY, Silicone Breast Prosthesis, Disp: , Rfl:  .  nortriptyline (PAMELOR) 10 MG capsule, Take 1 capsule (10  mg total) by mouth at bedtime. (Patient taking differently: Take 30 mg by mouth at bedtime. ), Disp: 90 capsule, Rfl: 3 .  nortriptyline (PAMELOR) 10 MG capsule, Take 1 capsule by mouth at bedtime, Disp: 90 capsule, Rfl: 0 .  potassium chloride SA (K-DUR,KLOR-CON) 20 MEQ tablet, Take 1 tablet (20 mEq total) by mouth daily., Disp: 90 tablet, Rfl: 3 .  simvastatin (ZOCOR) 20 MG tablet, Take 1 tablet (20 mg total) by mouth at bedtime., Disp: 90 tablet, Rfl: 3 .  timolol (BETIMOL) 0.5 % ophthalmic solution, Place 1 drop into both eyes 2 (two) times daily., Disp: , Rfl:  .  tobramycin (TOBREX) 0.3 % ophthalmic ointment, Place 1 application into the left eye as needed., Disp: , Rfl:  .  UNABLE TO FIND, 174.9 Left mastectomy   L8030-Silicone Breast Prosthesis-1, Disp: 1 each, Rfl: 0  EXAM:  Vitals:   08/15/18 1027  BP: 120/70  Pulse: (!) 53  Temp: 97.9 F (36.6 C)    Body mass index is 21.79 kg/m.  GENERAL: vitals reviewed and listed above, alert, oriented, appears well hydrated and in no acute distress  HEENT: atraumatic, conjunttiva clear, no obvious abnormalities on inspection of external nose and ears  NECK: no obvious masses on inspection  LUNGS: clear to auscultation bilaterally, no wheezes, rales or rhonchi, good air movement  CV: HRRR, no peripheral edema  MS: moves all extremities without noticeable abnormality  PSYCH: pleasant and cooperative, no obvious depression or anxiety  ASSESSMENT AND PLAN:  Discussed the following assessment and plan:  Essential hypertension - Plan: Basic metabolic panel  Chronic kidney disease (CKD), stage II (mild)  Hypothyroidism, unspecified type - Plan: TSH  MDD (recurrent major depressive disorder) in remission ([place])  Hyperlipidemia, unspecified hyperlipidemia type - Plan: HDL cholesterol, Cholesterol, total  -discussed medications and interactions -they opted to try going off of the nortriptyline - already on lowest possible  dose -she also stopped her K+ on her own - hate taking it -check labs today -follow up in 3 months, sooner as needed -Patient advised to return or notify a doctor immediately if symptoms worsen or persist or new concerns arise.  Patient Instructions  BEFORE YOU LEAVE: -flu shot -labs -follow up: AWV with Susan/CPE Dr. Maudie Mercury in 3-4 months  STOP the Nortriptyline. Let us know if any issues.  We have ordered labs or studies at this visit. It can take up to 1-2 weeks for results and processing. IF results require follow up or explanation, we will call you with instructions. Clinically stable results will be released to your [facility]. If you have not heard from [place] or cannot find your results in [facility] in 2 weeks please contact our office at [phone].  If you are not yet signed up for [facility], please consider signing up.  Hannah R Kim, DO   

## 2018-08-15 ENCOUNTER — Ambulatory Visit (INDEPENDENT_AMBULATORY_CARE_PROVIDER_SITE_OTHER): Payer: PPO | Admitting: Family Medicine

## 2018-08-15 ENCOUNTER — Encounter: Payer: Self-pay | Admitting: Family Medicine

## 2018-08-15 VITALS — BP 120/70 | HR 53 | Temp 97.9°F | Ht 61.0 in | Wt 115.3 lb

## 2018-08-15 DIAGNOSIS — E039 Hypothyroidism, unspecified: Secondary | ICD-10-CM | POA: Diagnosis not present

## 2018-08-15 DIAGNOSIS — I10 Essential (primary) hypertension: Secondary | ICD-10-CM | POA: Diagnosis not present

## 2018-08-15 DIAGNOSIS — E785 Hyperlipidemia, unspecified: Secondary | ICD-10-CM

## 2018-08-15 DIAGNOSIS — Z23 Encounter for immunization: Secondary | ICD-10-CM

## 2018-08-15 DIAGNOSIS — F3340 Major depressive disorder, recurrent, in remission, unspecified: Secondary | ICD-10-CM | POA: Diagnosis not present

## 2018-08-15 DIAGNOSIS — N182 Chronic kidney disease, stage 2 (mild): Secondary | ICD-10-CM | POA: Diagnosis not present

## 2018-08-15 LAB — TSH: TSH: 2.43 u[IU]/mL (ref 0.35–4.50)

## 2018-08-15 LAB — BASIC METABOLIC PANEL
BUN: 25 mg/dL — AB (ref 6–23)
CHLORIDE: 99 meq/L (ref 96–112)
CO2: 31 mEq/L (ref 19–32)
Calcium: 10.7 mg/dL — ABNORMAL HIGH (ref 8.4–10.5)
Creatinine, Ser: 1.6 mg/dL — ABNORMAL HIGH (ref 0.40–1.20)
GFR: 32.19 mL/min — ABNORMAL LOW (ref >60.00)
GLUCOSE: 91 mg/dL (ref 70–99)
POTASSIUM: 4.1 meq/L (ref 3.5–5.1)
SODIUM: 140 meq/L (ref 135–145)

## 2018-08-15 LAB — CHOLESTEROL, TOTAL: CHOLESTEROL: 165 mg/dL (ref 0–200)

## 2018-08-15 LAB — HDL CHOLESTEROL: HDL: 60.6 mg/dL (ref >39.00)

## 2018-08-15 NOTE — Patient Instructions (Signed)
BEFORE YOU LEAVE: -flu shot -labs -follow up: AWV with Susan/CPE Dr. Maudie Mercury in 3-4 months  STOP the Nortriptyline. Let us know if any issues.  We have ordered labs or studies at this visit. It can take up to 1-2 weeks for results and processing. IF results require follow up or explanation, we will call you with instructions. Clinically stable results will be released to your [facility]. If you have not heard from [place] or cannot find your results in [facility] in 2 weeks please contact our office at [phone].  If you are not yet signed up for [facility], please consider signing up.

## 2018-08-15 NOTE — Addendum Note (Signed)
Addended by: Agnes Lawrence on: 08/15/2018 11:07 AM   Modules accepted: Orders

## 2018-08-16 ENCOUNTER — Telehealth: Payer: Self-pay | Admitting: Family Medicine

## 2018-08-16 NOTE — Addendum Note (Signed)
Addended by: Agnes Lawrence on: 08/16/2018 01:59 PM   Modules accepted: Orders

## 2018-08-16 NOTE — Telephone Encounter (Signed)
Pt's daughter, [name] given results per notes of Dr. Maudie Mercury on 08/15/18. Pt's daughter verbalized understanding. Lab appt scheduled on 09/15/18.Unable to document in result note due to result note not being routed to [facility].

## 2018-08-18 DIAGNOSIS — N3946 Mixed incontinence: Secondary | ICD-10-CM | POA: Diagnosis not present

## 2018-08-18 DIAGNOSIS — R8271 Bacteriuria: Secondary | ICD-10-CM | POA: Diagnosis not present

## 2018-08-18 DIAGNOSIS — R350 Frequency of micturition: Secondary | ICD-10-CM | POA: Diagnosis not present

## 2018-09-06 DIAGNOSIS — H34832 Tributary (branch) retinal vein occlusion, left eye, with macular edema: Secondary | ICD-10-CM | POA: Diagnosis not present

## 2018-09-06 DIAGNOSIS — H353132 Nonexudative age-related macular degeneration, bilateral, intermediate dry stage: Secondary | ICD-10-CM | POA: Diagnosis not present

## 2018-09-06 DIAGNOSIS — H35371 Puckering of macula, right eye: Secondary | ICD-10-CM | POA: Diagnosis not present

## 2018-09-06 DIAGNOSIS — H348312 Tributary (branch) retinal vein occlusion, right eye, stable: Secondary | ICD-10-CM | POA: Diagnosis not present

## 2018-09-15 ENCOUNTER — Other Ambulatory Visit: Payer: PPO

## 2018-09-16 ENCOUNTER — Other Ambulatory Visit (INDEPENDENT_AMBULATORY_CARE_PROVIDER_SITE_OTHER): Payer: PPO

## 2018-09-16 LAB — BASIC METABOLIC PANEL
BUN: 24 mg/dL — ABNORMAL HIGH (ref 6–23)
CALCIUM: 9.9 mg/dL (ref 8.4–10.5)
CO2: 28 meq/L (ref 19–32)
Chloride: 101 mEq/L (ref 96–112)
Creatinine, Ser: 1.5 mg/dL — ABNORMAL HIGH (ref 0.40–1.20)
GFR: 34.67 mL/min — ABNORMAL LOW (ref >60.00)
GLUCOSE: 122 mg/dL — AB (ref 70–99)
Potassium: 4.5 mEq/L (ref 3.5–5.1)
Sodium: 141 mEq/L (ref 135–145)

## 2018-09-17 LAB — CALCIUM, IONIZED: Calcium, Ion: 5.08 mg/dL (ref 4.8–5.6)

## 2018-10-26 ENCOUNTER — Telehealth: Payer: Self-pay

## 2018-10-26 NOTE — Telephone Encounter (Signed)
Call to dtr of ms. [place] Will cancel the AWV but not Dr. Julianne Rice visit  Dr. Maudie Mercury may do the AWV or they can schedule for January when the schedule is up  Wynetta Fines RN

## 2018-11-19 NOTE — Progress Notes (Signed)
Medicare Annual Preventive Care Visit  (initial annual wellness or annual wellness exam)  Concerns and/or follow up today: Last AWV was 09/06/17  Alison Cordova is a pleasant 84 y.o. here for follow up. Chronic medical problems summarized below were reviewed for changes and stability and were updated as needed below. These issues and their treatment remain stable for the most part. Reports doing well. No complaints today. daughter kept her on nortriptyline for the holiday and then plans to taper/trial off. Denies CP, SOB, DOE, depression, treatment intolerance or new symptoms.eating well. No falls.  HTN/hx CVA/CKD: -meds:amlodipine, asa, atenolol-chlorthalidone, lisinopril  Hypothyroid:  -med: levothyroxine  HLD/Obesity: -on statin  OAB: -meds: myrbetriq -she gets samples from her urologist  Anxiety and Depression: -saw psychiatrist and was stable on the same medications for many many years and now no longer sees psychiatrist -meds: nortriptyline (plans to stop), fluvoxamine  GERD: -meds: pepcid  Hx breast Ca: -with breast prosthesis  See HM section in Epic for other details of completed HM. See scanned documentation under Media Tab for further documentation HPI, health risk assessment. See Media Tab and Care Teams sections in Epic for other providers.  ROS: negative for report of fevers, unintentional weight loss, vision changes, vision loss, hearing loss or change, chest pain, sob, hemoptysis, melena, hematochezia, hematuria, genital discharge or lesions, falls, bleeding or bruising, loc, thoughts of suicide or self harm, memory loss  1.) Patient-completed health risk assessment  - completed and reviewed, see scanned documentation  2.) Review of Medical History: -PMH, PSH, Family History and current specialty and care providers reviewed and updated and listed below  - see scanned in document in chart and below  Past Medical History:  Diagnosis Date  . Allergic  rhinitis   . Anemia 06/17/2012  . Anxiety and depression   . Cataract 06/17/2012  . Chronic kidney disease   . CVA (cerebral vascular accident) ([place])   . Diverticulosis 03/14/2008   Qualifier: Diagnosis of  By: Lenna Gilford MD, Deborra Medina   . Glaucoma   . Hemorrhoids 07/03/2011  . Hx of breast cancer    Left  . Hyperlipemia   . Hyperlipidemia   . Hypertension   . Hypothyroid   . Irritable bowel syndrome 09/17/2007   Qualifier: Diagnosis of  By: Rosana Hoes CMA, Tammy    . Lung nodule 07/06/2011  . Peripheral neuropathy   . Urinary incontinence     Past Surgical History:  Procedure Laterality Date  .  FUNDIPLICATION    . BREAST SURGERY     left breast mastectomy   . EYE SURGERY     TO DESOLVE AN AREA IN HER LEFT EYE?  . SPINE SURGERY    . VAGINAL HYSTERECTOMY      Social History   Socioeconomic History  . Marital status: Widowed    Spouse name: Not on file  . Number of children: Not on file  . Years of education: Not on file  . Highest education level: Not on file  Occupational History  . Not on file  Social Needs  . Financial resource strain: Not on file  . Food insecurity:    Worry: Not on file    Inability: Not on file  . Transportation needs:    Medical: Not on file    Non-medical: Not on file  Tobacco Use  . Smoking status: Former Smoker    Types: Cigarettes    Last attempt to quit: 11/23/1962    Years since quitting: 56.0  .  Smokeless tobacco: Never Used  Substance and Sexual Activity  . Alcohol use: Yes    Comment: PT STATES DRINKS A BEER OR WINE OCCASSIONALLY, NOT WEEKLY  . Drug use: No  . Sexual activity: Not on file  Lifestyle  . Physical activity:    Days per week: Not on file    Minutes per session: Not on file  . Stress: Not on file  Relationships  . Social connections:    Talks on phone: Not on file    Gets together: Not on file    Attends religious service: Not on file    Active member of club or organization: Not on file    Attends meetings of clubs  or organizations: Not on file    Relationship status: Not on file  . Intimate partner violence:    Fear of current or ex partner: Not on file    Emotionally abused: Not on file    Physically abused: Not on file    Forced sexual activity: Not on file  Other Topics Concern  . Not on file  Social History Narrative   Work or School: none      Home Situation: lives with daughter and son and law      Spiritual Beliefs: [employer]      Lifestyle: no regular exercise; diet is ok             History reviewed. No pertinent family history.  Current Outpatient Medications on File Prior to Visit  Medication Sig Dispense Refill  . acetaminophen (TYLENOL) 650 MG CR tablet Take 650 mg by mouth every 8 (eight) hours as needed.      Marland Kitchen amLODipine (NORVASC) 2.5 MG tablet TAKE 1 TABLET BY MOUTH EVERY DAY 90 tablet 2  . aspirin 325 MG tablet Take 325 mg by mouth daily.      Marland Kitchen atenolol-chlorthalidone (TENORETIC) 50-25 MG tablet Take 1 tablet by mouth daily. 90 tablet 1  . cetirizine (ZYRTEC) 10 MG tablet Take 1 tablet (10 mg total) by mouth daily. 90 tablet 3  . famotidine (PEPCID) 20 MG tablet Take 1 tablet (20 mg total) by mouth 2 (two) times daily as needed. 180 tablet 3  . fluocinonide cream (LIDEX) 0.05 % Apply topically 2 (two) times daily. 90 g 3  . fluticasone (FLONASE) 50 MCG/ACT nasal spray Place 2 sprays into both nostrils daily. 16 g 3  . fluvoxaMINE (LUVOX) 100 MG tablet Take 1 tablet (100 mg total) by mouth daily. (Patient taking differently: Take 100 mg by mouth daily as needed. ) 90 tablet 1  . latanoprost (XALATAN) 0.005 % ophthalmic solution Place 1 drop into both eyes at bedtime.      Marland Kitchen levothyroxine (SYNTHROID, LEVOTHROID) 50 MCG tablet Take 1 tablet by mouth daily 90 tablet 1  . lisinopril (PRINIVIL,ZESTRIL) 20 MG tablet Take 1 tablet by mouth daily 90 tablet 1  . mirabegron ER (MYRBETRIQ) 50 MG TB24 tablet Take 50 mg by mouth every other day.    . Multiple Vitamins-Minerals  (WOMENS MULTIVITAMIN PLUS PO) Take 1 tablet by mouth daily.      . NON FORMULARY Post Surgical Bras    . NON FORMULARY Non-Silicone Breast Prosthesis    . NON FORMULARY Silicone Breast Prosthesis    . NORTRIPTYLINE HCL PO Take by mouth.    . simvastatin (ZOCOR) 20 MG tablet Take 1 tablet (20 mg total) by mouth at bedtime. 90 tablet 3  . timolol (BETIMOL) 0.5 % ophthalmic solution Place  1 drop into both eyes 2 (two) times daily.    Marland Kitchen tobramycin (TOBREX) 0.3 % ophthalmic ointment Place 1 application into the left eye as needed.    Marland Kitchen UNABLE TO FIND 174.9 Left mastectomy   L8030-Silicone Breast Prosthesis-1 1 each 0   No current facility-administered medications on file prior to visit.      3.) Review of functional ability and level of safety:  Any difficulty hearing?  See scanned documentation. Hearing aides.  History of falling?  See scanned documentation  Any trouble with IADLs - using a phone, using transportation, grocery shopping, preparing meals, doing housework, doing laundry, taking medications and managing money?  See scanned documentation  Advance Directives?  Discussed briefly. See Scanned documentation  See summary of recommendations in Patient Instructions below.  4.) Physical Exam Vitals:   11/21/18 0905  BP: 122/80  Pulse: 89  Temp: 97.6 F (36.4 C)   Estimated body mass index is 23.31 kg/m as calculated from the following:   Height as of this encounter: 4' 10.25" (1.48 m).   Weight as of this encounter: 112 lb 8 oz (51 kg).  EKG (optional): deferred  General: alert, appear well hydrated and in no acute distress  HEENT: visual acuity grossly intact  CV: HRRR  Lungs: CTA bilaterally  BREAST/GYN: refused  Psych: pleasant and cooperative, no obvious depression or anxiety  Cognitive function grossly intact  See patient instructions for recommendations.  Education and counseling regarding the above review of health provided with a plan for the  following: -see scanned patient completed form for further details -fall prevention strategies discussed  -healthy lifestyle discussed -importance and resources for completing advanced directives discussed -see patient instructions below for any other recommendations provided  4)The following written screening schedule of preventive measures were reviewed with assessment and plan made per below, orders and patient instructions:      AAA screening done if applicable     Alcohol screening done     Obesity Screening and counseling done     STI screening (Hep C if born [birth year]) offered and per pt wishes     Tobacco Screening done        Pneumococcal (PPSV23 -one dose after 64, one before if risk factors), influenza yearly and hepatitis B vaccines (if high risk - end stage renal disease, IV drugs, homosexual men, live in home for mentally retarded, hemophilia receiving factors) ASSESSMENT/PLAN: done if applicable      Screening mammograph (yearly if >40) ASSESSMENT/PLAN: utd - declined      Screening Pap smear/pelvic exam (q2 years) ASSESSMENT/PLAN: n/a, declined      Colorectal cancer screening (FOBT yearly or flex sig q4y or colonoscopy q10y or barium enema q4y) ASSESSMENT/PLAN: n/a      Diabetes outpatient self-management training services ASSESSMENT/PLAN: utd or done      Bone mass measurements(covered q2y if indicated - estrogen def, osteoporosis, hyperparathyroid, vertebral abnormalities, osteoporosis or steroids) ASSESSMENT/PLAN: utd or discussed and ordered per pt wishes      Screening for glaucoma(q1y if high risk - diabetes, FH, AA and > 50 or hispanic and > 65) ASSESSMENT/PLAN: utd or advised      Medical nutritional therapy for individuals with diabetes or renal disease ASSESSMENT/PLAN: see orders      Cardiovascular screening blood tests (lipids q5y) ASSESSMENT/PLAN: see orders and labs      Diabetes screening tests ASSESSMENT/PLAN: see orders and labs   7.)  Summary:  Encounter for Medicare annual wellness exam - Plan:  Lipid panel, Hemoglobin A1c -risk factors and conditions per above assessment were discussed and treatment, recommendations and referrals were offered per documentation above and orders and patient instructions.  MDD (recurrent major depressive disorder) in remission ([place]) -trial off nortriptyline to minimize interactions -trial decreasing meds -they are to call if any concerns or worsening of mood and agree  Essential hypertension -stable, did not take medications this morning  Hypothyroidism, unspecified type - Plan: TSH  Chronic kidney disease (CKD), stage II (mild)  Patient Instructions   BEFORE YOU LEAVE: -labs -follow up: 4 months   Ms. Caison , Thank you for taking time to come for your Medicare Wellness Visit. I appreciate your ongoing commitment to your health goals. Please review the following plan we discussed and let me know if I can assist you in the future.   These are the goals we discussed: Goals    . Exercise 150 minutes per week (moderate activity)     Try to exercise more; get up and down often from the chair   Healthy diet       This is a list of the screening recommended for you and due dates:  Health Maintenance  Topic Date Due  . Mammogram  11/30/2021*  . Tetanus Vaccine  02/20/2020  . Flu Shot  Completed  . DEXA scan (bone density measurement)  Completed  . Pneumonia vaccines  Completed  *Topic was postponed. The date shown is not the original due date.      Lucretia Kern, DO

## 2018-11-21 ENCOUNTER — Ambulatory Visit (INDEPENDENT_AMBULATORY_CARE_PROVIDER_SITE_OTHER): Payer: PPO | Admitting: Family Medicine

## 2018-11-21 ENCOUNTER — Encounter: Payer: Self-pay | Admitting: Family Medicine

## 2018-11-21 ENCOUNTER — Ambulatory Visit: Payer: PPO

## 2018-11-21 VITALS — BP 122/80 | HR 89 | Temp 97.6°F | Ht 58.25 in | Wt 112.5 lb

## 2018-11-21 DIAGNOSIS — Z0000 Encounter for general adult medical examination without abnormal findings: Secondary | ICD-10-CM

## 2018-11-21 DIAGNOSIS — I10 Essential (primary) hypertension: Secondary | ICD-10-CM

## 2018-11-21 DIAGNOSIS — N182 Chronic kidney disease, stage 2 (mild): Secondary | ICD-10-CM

## 2018-11-21 DIAGNOSIS — F3340 Major depressive disorder, recurrent, in remission, unspecified: Secondary | ICD-10-CM

## 2018-11-21 DIAGNOSIS — E039 Hypothyroidism, unspecified: Secondary | ICD-10-CM | POA: Diagnosis not present

## 2018-11-21 LAB — LIPID PANEL
CHOL/HDL RATIO: 3
Cholesterol: 185 mg/dL (ref 0–200)
HDL: 57.9 mg/dL (ref >39.00)
LDL Cholesterol: 97 mg/dL (ref 0–99)
NonHDL: 127.03
TRIGLYCERIDES: 151 mg/dL — AB (ref 0.0–149.0)
VLDL: 30.2 mg/dL (ref 0.0–40.0)

## 2018-11-21 LAB — TSH: TSH: 3.68 u[IU]/mL (ref 0.35–4.50)

## 2018-11-21 LAB — HEMOGLOBIN A1C: Hgb A1c MFr Bld: 5.8 % (ref 4.6–6.5)

## 2018-11-21 NOTE — Patient Instructions (Signed)
BEFORE YOU LEAVE: -labs -follow up: 4 months   Alison Cordova , Thank you for taking time to come for your Medicare Wellness Visit. I appreciate your ongoing commitment to your health goals. Please review the following plan we discussed and let me know if I can assist you in the future.   These are the goals we discussed: Goals    . Exercise 150 minutes per week (moderate activity)     Try to exercise more; get up and down often from the chair   Healthy diet       This is a list of the screening recommended for you and due dates:  Health Maintenance  Topic Date Due  . Mammogram  11/30/2021*  . Tetanus Vaccine  02/20/2020  . Flu Shot  Completed  . DEXA scan (bone density measurement)  Completed  . Pneumonia vaccines  Completed  *Topic was postponed. The date shown is not the original due date.

## 2018-12-13 ENCOUNTER — Other Ambulatory Visit: Payer: Self-pay | Admitting: Family Medicine

## 2018-12-19 DIAGNOSIS — H348312 Tributary (branch) retinal vein occlusion, right eye, stable: Secondary | ICD-10-CM | POA: Diagnosis not present

## 2018-12-19 DIAGNOSIS — H35373 Puckering of macula, bilateral: Secondary | ICD-10-CM | POA: Diagnosis not present

## 2018-12-19 DIAGNOSIS — H34832 Tributary (branch) retinal vein occlusion, left eye, with macular edema: Secondary | ICD-10-CM | POA: Diagnosis not present

## 2018-12-19 DIAGNOSIS — H353112 Nonexudative age-related macular degeneration, right eye, intermediate dry stage: Secondary | ICD-10-CM | POA: Diagnosis not present

## 2018-12-21 ENCOUNTER — Telehealth: Payer: Self-pay | Admitting: Family Medicine

## 2018-12-21 MED ORDER — AMLODIPINE BESYLATE 2.5 MG PO TABS: 2.5000 mg | ORAL_TABLET | Freq: Every day | ORAL | 1 refills | Status: DC

## 2018-12-21 NOTE — Telephone Encounter (Signed)
Rx done. 

## 2018-12-21 NOTE — Telephone Encounter (Signed)
Copied from [place]. Topic: Quick Communication - Rx Refill/Question >> Dec 21, 2018 11:39 AM Virl Axe D wrote: Medication: amLODipine (NORVASC) 2.5 MG tablet / Pt's daughter stated she is no longer using [facility] but is now using [facility]. Advised daughter to have PCP to fax rx to them  Has the patient contacted their pharmacy? Yes.   (Agent: If no, request that the patient contact the pharmacy for the refill.) (Agent: If yes, when and what did the pharmacy advise?)  Preferred Pharmacy (with phone number or street name): [facility]) - [place], [place] [phone] (Phone) [phone] (Fax)  Agent: Please be advised that RX refills may take up to 3 business days. We ask that you follow-up with your pharmacy.

## 2019-01-24 ENCOUNTER — Other Ambulatory Visit: Payer: Self-pay | Admitting: *Deleted

## 2019-01-24 MED ORDER — SIMVASTATIN 20 MG PO TABS: 20.0000 mg | ORAL_TABLET | Freq: Every day | ORAL | 1 refills | Status: DC

## 2019-01-24 NOTE — Telephone Encounter (Signed)
Rx done. 

## 2019-02-28 ENCOUNTER — Other Ambulatory Visit: Payer: Self-pay | Admitting: *Deleted

## 2019-02-28 MED ORDER — ATENOLOL-CHLORTHALIDONE 50-25 MG PO TABS: 1.0000 | ORAL_TABLET | Freq: Every day | ORAL | 1 refills | Status: DC

## 2019-02-28 MED ORDER — LISINOPRIL 20 MG PO TABS: 20.0000 mg | ORAL_TABLET | Freq: Every day | ORAL | 1 refills | Status: DC

## 2019-02-28 NOTE — Telephone Encounter (Signed)
Rx done. 

## 2019-03-01 ENCOUNTER — Other Ambulatory Visit: Payer: Self-pay | Admitting: *Deleted

## 2019-03-01 MED ORDER — AMLODIPINE BESYLATE 2.5 MG PO TABS: 2.5000 mg | ORAL_TABLET | Freq: Every day | ORAL | 1 refills | Status: DC

## 2019-03-01 NOTE — Telephone Encounter (Signed)
Rx done. 

## 2019-03-14 DIAGNOSIS — H34832 Tributary (branch) retinal vein occlusion, left eye, with macular edema: Secondary | ICD-10-CM | POA: Diagnosis not present

## 2019-03-23 ENCOUNTER — Ambulatory Visit (INDEPENDENT_AMBULATORY_CARE_PROVIDER_SITE_OTHER): Payer: PPO | Admitting: Family Medicine

## 2019-03-23 ENCOUNTER — Encounter: Payer: Self-pay | Admitting: Family Medicine

## 2019-03-23 ENCOUNTER — Other Ambulatory Visit: Payer: Self-pay

## 2019-03-23 ENCOUNTER — Telehealth: Payer: Self-pay | Admitting: Family Medicine

## 2019-03-23 VITALS — Wt 115.0 lb

## 2019-03-23 DIAGNOSIS — I10 Essential (primary) hypertension: Secondary | ICD-10-CM

## 2019-03-23 DIAGNOSIS — E785 Hyperlipidemia, unspecified: Secondary | ICD-10-CM

## 2019-03-23 DIAGNOSIS — N182 Chronic kidney disease, stage 2 (mild): Secondary | ICD-10-CM

## 2019-03-23 DIAGNOSIS — E039 Hypothyroidism, unspecified: Secondary | ICD-10-CM

## 2019-03-23 MED ORDER — LEVOTHYROXINE SODIUM 50 MCG PO TABS: 50.0000 ug | ORAL_TABLET | Freq: Every day | ORAL | 1 refills | Status: DC

## 2019-03-23 NOTE — Progress Notes (Signed)
Virtual Visit via Video Note  I connected with Alison Cordova  on 03/23/19 at 10:15 AM EDT by a video enabled telemedicine application and verified that I am speaking with the correct person using two identifiers. They were not able to access the video component today so visit was completed successfully via audio.  Location patient: home Location provider:work or home office Persons participating in the virtual visit: patient, provider, pts daughter  I discussed the limitations of evaluation and management by telemedicine and the availability of in person appointments. The patient expressed understanding and agreed to proceed.   HPI:  Alison Cordova is a pleasant 85 y.o. here for follow up. Chronic medical problems summarized below were reviewed for changes and stability and were updated as needed below. These issues and their treatment remain stable for the most part. Daughter and patient report she is doing great. Has tapered off all psychiatric medications and feels fine. No depression or anxiety. Labs looked good and were quite stable on the last check. Denies CP, SOB, HA, vision changes, DOE, treatment intolerance or new symptoms.  AWV was 11/21/2018 with Dr Maudie Mercury  HTN/hx CVA/CKD: -meds:amlodipine, asa, atenolol-chlorthalidone, lisinopril  Hypothyroid:  -med: levothyroxine  HLD/Obesity: -on statin  OAB: -meds: myrbetriq -she gets samples from her urologist  Anxiety and Depression: -saw psychiatrist and was stable on the same medications for many many years and now no longer sees psychiatrist -meds: nortriptyline, fluvoxamine in the passed  GERD: -meds: pepcid  Hx breast Ca: -with breast prosthesis   ROS: See pertinent positives and negatives per HPI.  Past Medical History:  Diagnosis Date  . Allergic rhinitis   . Anemia 06/17/2012  . Anxiety and depression   . Cataract 06/17/2012  . Chronic kidney disease   . CVA (cerebral vascular accident) ([place])   . Diverticulosis  03/14/2008   Qualifier: Diagnosis of  By: Lenna Gilford MD, Deborra Medina   . Glaucoma   . Hemorrhoids 07/03/2011  . Hx of breast cancer    Left  . Hyperlipemia   . Hyperlipidemia   . Hypertension   . Hypothyroid   . Irritable bowel syndrome 09/17/2007   Qualifier: Diagnosis of  By: Rosana Hoes CMA, Tammy    . Lung nodule 07/06/2011  . Peripheral neuropathy   . Urinary incontinence     Past Surgical History:  Procedure Laterality Date  .  FUNDIPLICATION    . BREAST SURGERY     left breast mastectomy   . EYE SURGERY     TO DESOLVE AN AREA IN HER LEFT EYE?  . SPINE SURGERY    . VAGINAL HYSTERECTOMY      No family history on file.  SOCIAL HX: see hpi   Current Outpatient Medications:  .  acetaminophen (TYLENOL) 650 MG CR tablet, Take 650 mg by mouth every 8 (eight) hours as needed.  , Disp: , Rfl:  .  amLODipine (NORVASC) 2.5 MG tablet, Take 1 tablet (2.5 mg total) by mouth daily., Disp: 90 tablet, Rfl: 1 .  aspirin 325 MG tablet, Take 325 mg by mouth daily.  , Disp: , Rfl:  .  atenolol-chlorthalidone (TENORETIC) 50-25 MG tablet, Take 1 tablet by mouth daily., Disp: 90 tablet, Rfl: 1 .  cetirizine (ZYRTEC) 10 MG tablet, Take 1 tablet (10 mg total) by mouth daily., Disp: 90 tablet, Rfl: 3 .  famotidine (PEPCID) 20 MG tablet, Take 1 tablet (20 mg total) by mouth 2 (two) times daily as needed., Disp: 180 tablet, Rfl: 3 .  fluocinonide  cream (LIDEX) 0.05 %, Apply topically 2 (two) times daily., Disp: 90 g, Rfl: 3 .  fluticasone (FLONASE) 50 MCG/ACT nasal spray, Place 2 sprays into both nostrils daily., Disp: 16 g, Rfl: 3 .  fluvoxaMINE (LUVOX) 100 MG tablet, Take 1 tablet (100 mg total) by mouth daily. (Patient taking differently: Take 100 mg by mouth daily as needed. ), Disp: 90 tablet, Rfl: 1 .  latanoprost (XALATAN) 0.005 % ophthalmic solution, Place 1 drop into both eyes at bedtime.  , Disp: , Rfl:  .  levothyroxine (SYNTHROID, LEVOTHROID) 50 MCG tablet, Take 1 tablet by mouth daily, Disp: 90  tablet, Rfl: 1 .  lisinopril (PRINIVIL,ZESTRIL) 20 MG tablet, Take 1 tablet (20 mg total) by mouth daily., Disp: 90 tablet, Rfl: 1 .  mirabegron ER (MYRBETRIQ) 50 MG TB24 tablet, Take 50 mg by mouth every other day., Disp: , Rfl:  .  Multiple Vitamins-Minerals (WOMENS MULTIVITAMIN PLUS PO), Take 1 tablet by mouth daily.  , Disp: , Rfl:  .  NON FORMULARY, Post Surgical Bras, Disp: , Rfl:  .  NON FORMULARY, Non-Silicone Breast Prosthesis, Disp: , Rfl:  .  NON FORMULARY, Silicone Breast Prosthesis, Disp: , Rfl:  .  NORTRIPTYLINE HCL PO, Take by mouth., Disp: , Rfl:  .  simvastatin (ZOCOR) 20 MG tablet, Take 1 tablet (20 mg total) by mouth at bedtime., Disp: 90 tablet, Rfl: 1 .  timolol (BETIMOL) 0.5 % ophthalmic solution, Place 1 drop into both eyes 2 (two) times daily., Disp: , Rfl:  .  tobramycin (TOBREX) 0.3 % ophthalmic ointment, Place 1 application into the left eye as needed., Disp: , Rfl:  .  UNABLE TO FIND, 174.9 Left mastectomy   L8030-Silicone Breast Prosthesis-1, Disp: 1 each, Rfl: 0  EXAM:  VITALS per patient if applicable:wt 376,  283/15  GENERAL: alert, oriented, no audible  acute distress  LUNGS: No audible resp distress  MS: rable to ambulate to the scale on her own to check weight  PSYCH/NEURO: pleasant and cooperative, no obvious depression or anxiety, speech and thought processing grossly intact  ASSESSMENT AND PLAN:  Discussed the following assessment and plan:  Essential hypertension  Hypothyroidism, unspecified type  Chronic kidney disease (CKD), stage II (mild)  Hyperlipidemia, unspecified hyperlipidemia type  MDD (recurrent major depressive disorder) in remission ([place])  Is feeling well. Of several medications. Updated medication list. Practicing social distancing. No symptoms today. BP cuff is very old and they feel that may be why it is running high. Discussed implications and risks. Daughter has opted to pick up new cuff today and monitor and follow  up with me next week to review these values and then decide if change is needed in therapy.  Otherwise seems to be doing well. I am glad her mood is good off of medications. Medication list updated. Depression removed from problem list.  I discussed the assessment and treatment plan with the patient. The patient was provided an opportunity to ask questions and all were answered. The patient agreed with the plan and demonstrated an understanding of the instructions.   The patient was advised to call back or seek an in-person evaluation if the symptoms worsen or if the condition fails to improve as anticipated.   Follow up instructions: Advised assistant Wendie Simmer to help patient arrange the following: -BP follow up doxy.me with Dr. Maudie Mercury in afternoon next week [phone] Lucretia Kern, DO

## 2019-03-23 NOTE — Telephone Encounter (Signed)
Copied from [place] [id number]. Topic: Quick Communication - Rx Refill/Question >> Mar 23, 2019  2:43 PM Robina Ade, Helene Kelp D wrote: Medication: levothyroxine (SYNTHROID) 50 MCG tablet  Has the patient contacted their pharmacy? Yes, Sarah with [facility] is calling to see if its ok to change meds from [employer] to [employer] brand. Please call her back [phone]. (Agent: If no, request that the patient contact the pharmacy for the refill.) (Agent: If yes, when and what did the pharmacy advise?)  Preferred Pharmacy (with phone number or street name): [facility] ([place], [place]  Agent: Please be advised that RX refills may take up to 3 business days. We ask that you follow-up with your pharmacy.

## 2019-03-23 NOTE — Telephone Encounter (Signed)
Ok to Advertising copywriter. Ensure has 90 days with one refill. Thanks.

## 2019-03-24 NOTE — Telephone Encounter (Signed)
Called Alison Cordova. No answer. LVM for okay to change brands and to call office if anything else is needed.

## 2019-03-28 DIAGNOSIS — R351 Nocturia: Secondary | ICD-10-CM | POA: Diagnosis not present

## 2019-03-28 DIAGNOSIS — N3946 Mixed incontinence: Secondary | ICD-10-CM | POA: Diagnosis not present

## 2019-03-30 ENCOUNTER — Other Ambulatory Visit: Payer: Self-pay

## 2019-03-30 ENCOUNTER — Ambulatory Visit (INDEPENDENT_AMBULATORY_CARE_PROVIDER_SITE_OTHER): Payer: PPO | Admitting: Family Medicine

## 2019-03-30 VITALS — BP 151/64

## 2019-03-30 DIAGNOSIS — I10 Essential (primary) hypertension: Secondary | ICD-10-CM | POA: Diagnosis not present

## 2019-03-30 MED ORDER — AMLODIPINE BESYLATE 5 MG PO TABS: 5.0000 mg | ORAL_TABLET | Freq: Every day | ORAL | 1 refills | Status: DC

## 2019-03-30 NOTE — Progress Notes (Signed)
Virtual Visit via Video Note  I connected with Alison Cordova  on 03/30/19 at  1:00 PM EDT by a video enabled telemedicine application and verified that I am speaking with the correct person using two identifiers.  Location patient: home Location provider:work or home office Persons participating in the virtual visit: patient, provider  I discussed the limitations of evaluation and management by telemedicine and the availability of in person appointments. The patient expressed understanding and agreed to proceed.   HPI:  Follow up Hypertension: -on May 5th BP 168/73 at Dr. Jerilynn Mages -160/63, 154/73, 164/82, 140/79, today 151/64 lying down -medication: norvasc 2.5mg , tenoretic 50-25, lisinopril 20mg  -denies swelling, CP, SOB, dizziness --occ headache  ROS: See pertinent positives and negatives per HPI.  Past Medical History:  Diagnosis Date  . Allergic rhinitis   . Anemia 06/17/2012  . Anxiety and depression   . Cataract 06/17/2012  . Chronic kidney disease   . CVA (cerebral vascular accident) ([place])   . Diverticulosis 03/14/2008   Qualifier: Diagnosis of  By: Lenna Gilford MD, Deborra Medina   . Glaucoma   . Hemorrhoids 07/03/2011  . Hx of breast cancer    Left  . Hyperlipemia   . Hyperlipidemia   . Hypertension   . Hypothyroid   . Irritable bowel syndrome 09/17/2007   Qualifier: Diagnosis of  By: Rosana Hoes CMA, Tammy    . Lung nodule 07/06/2011  . Peripheral neuropathy   . Urinary incontinence     Past Surgical History:  Procedure Laterality Date  .  FUNDIPLICATION    . BREAST SURGERY     left breast mastectomy   . EYE SURGERY     TO DESOLVE AN AREA IN HER LEFT EYE?  . SPINE SURGERY    . VAGINAL HYSTERECTOMY      No family history on file.  SOCIAL HX: see hpi   Current Outpatient Medications:  .  acetaminophen (TYLENOL) 650 MG CR tablet, Take 650 mg by mouth every 8 (eight) hours as needed.  , Disp: , Rfl:  .  amLODipine (NORVASC) 5 MG tablet, Take 1 tablet (5 mg total) by mouth daily., Disp:  90 tablet, Rfl: 1 .  aspirin 325 MG tablet, Take 325 mg by mouth daily.  , Disp: , Rfl:  .  atenolol-chlorthalidone (TENORETIC) 50-25 MG tablet, Take 1 tablet by mouth daily., Disp: 90 tablet, Rfl: 1 .  cetirizine (ZYRTEC) 10 MG tablet, Take 1 tablet (10 mg total) by mouth daily., Disp: 90 tablet, Rfl: 3 .  fluocinonide cream (LIDEX) 0.05 %, Apply topically 2 (two) times daily., Disp: 90 g, Rfl: 3 .  fluticasone (FLONASE) 50 MCG/ACT nasal spray, Place 2 sprays into both nostrils daily., Disp: 16 g, Rfl: 3 .  latanoprost (XALATAN) 0.005 % ophthalmic solution, Place 1 drop into both eyes at bedtime.  , Disp: , Rfl:  .  levothyroxine (SYNTHROID) 50 MCG tablet, Take 1 tablet (50 mcg total) by mouth daily., Disp: 90 tablet, Rfl: 1 .  lisinopril (PRINIVIL,ZESTRIL) 20 MG tablet, Take 1 tablet (20 mg total) by mouth daily., Disp: 90 tablet, Rfl: 1 .  mirabegron ER (MYRBETRIQ) 50 MG TB24 tablet, Take 50 mg by mouth every other day., Disp: , Rfl:  .  Multiple Vitamins-Minerals (WOMENS MULTIVITAMIN PLUS PO), Take 1 tablet by mouth daily.  , Disp: , Rfl:  .  NON FORMULARY, Post Surgical Bras, Disp: , Rfl:  .  NON FORMULARY, Non-Silicone Breast Prosthesis, Disp: , Rfl:  .  NON FORMULARY, Silicone Breast Prosthesis,  Disp: , Rfl:  .  simvastatin (ZOCOR) 20 MG tablet, Take 1 tablet (20 mg total) by mouth at bedtime., Disp: 90 tablet, Rfl: 1 .  timolol (BETIMOL) 0.5 % ophthalmic solution, Place 1 drop into both eyes 2 (two) times daily., Disp: , Rfl:  .  tobramycin (TOBREX) 0.3 % ophthalmic ointment, Place 1 application into the left eye as needed., Disp: , Rfl:  .  UNABLE TO FIND, 174.9 Left mastectomy   L8030-Silicone Breast Prosthesis-1, Disp: 1 each, Rfl: 0  EXAM:  VITALS per patient if applicable:   GENERAL: alert, oriented, appears well and in no acute distress  HEENT: atraumatic, conjunttiva clear, no obvious abnormalities on inspection of external nose and ears  NECK: normal movements of the head  and neck  LUNGS: on inspection no signs of respiratory distress, breathing rate appears normal, no obvious gross SOB, gasping or wheezing  CV: no obvious cyanosis  MS: moves all visible extremities without noticeable abnormality  PSYCH/NEURO: pleasant and cooperative, no obvious depression or anxiety, speech and thought processing grossly intact  ASSESSMENT AND PLAN:  Discussed the following assessment and plan:  Essential hypertension  All BP high except one when she was lying down with occ mild headaches. Discussed options. They report was quite high at recent office visit with urology. Opted to increase norvasc just a tad. Given age will be cautious. Follow up in 3-4 weeks.   I discussed the assessment and treatment plan with the patient. The patient was provided an opportunity to ask questions and all were answered. The patient agreed with the plan and demonstrated an understanding of the instructions.   The patient was advised to call back or seek an in-person evaluation if the symptoms worsen or if the condition fails to improve as anticipated.   Follow up instructions: Advised assistant Wendie Simmer to help patient arrange the following: -follow up with Dr. Maudie Mercury in 3-4 weeks - Hypertension.  Alison Kern, DO

## 2019-03-31 ENCOUNTER — Telehealth: Payer: Self-pay | Admitting: Family Medicine

## 2019-03-31 NOTE — Telephone Encounter (Signed)
Copied from [place] [id number]. Topic: Quick Communication - Rx Refill/Question >> Mar 31, 2019  9:56 AM Margot Ables wrote: Medication: amLODipine (NORVASC) 5 MG tablet - call to clarify dose - pt was previously ordered amlodipine 2.5mg  - please call with clarification Direct Line for [facility] [phone], Secure VM if no answer - must leave first name, last name, and title No reference #.  Preferred Pharmacy (with phone number or street name): [facility]) - [place], [place] [phone] (Phone) [phone] (Fax)

## 2019-03-31 NOTE — Telephone Encounter (Signed)
I called [facility] at the number below and informed Alison Cordova per office notes from the phone visit on 5/7, Dr Maudie Mercury increased the dose of the medication and to discontinue the 2.5mg  tablet.

## 2019-05-02 ENCOUNTER — Ambulatory Visit (INDEPENDENT_AMBULATORY_CARE_PROVIDER_SITE_OTHER): Payer: PPO | Admitting: Family Medicine

## 2019-05-02 ENCOUNTER — Other Ambulatory Visit: Payer: Self-pay

## 2019-05-02 VITALS — BP 124/71

## 2019-05-02 DIAGNOSIS — I10 Essential (primary) hypertension: Secondary | ICD-10-CM

## 2019-05-02 NOTE — Progress Notes (Signed)
Virtual Visit via Telephone Note  I connected with Alison Cordova on 05/02/19 at 10:40 AM EDT by telephone and verified that I am speaking with the correct person using two identifiers.   I discussed the limitations, risks, security and privacy concerns of performing an evaluation and management service by telephone and the availability of in person appointments. I also discussed with the patient that there may be a patient responsible charge related to this service. The patient expressed understanding and agreed to proceed.  Location patient: home Location provider: work or home office Participants present for the call: patient, provider Patient did not have a visit in the prior 7 days to address this/these issue(s).   History of Present Illness:   Follow up blood pressure: -increased norvasc at recent [place] in May -reports: doing much better; 120s/68-70s -denies: CP, SOB, DOE, swelling -they report she is getting out walking -occ aches and pains with arthritis that are relieved with tylenol, does not take more then once daily  Observations/Objective: Patient sounds cheerful and well on the phone. I do not appreciate any SOB. Speech and thought processing are grossly intact. Patient reported vitals: BP 124/80  Assessment and Plan: Hypertension They would like to avoid in office visits if possible during the [place] pandemic, so opted to hold off on labs for now. May try to get drive up labs after next visit if they are agreeable.  Follow Up Instructions: Continue the Norvasc at the current dose Follow up 3-4 months   I did not refer this patient for an OV in the next 24 hours for this/these issue(s).  I discussed the assessment and treatment plan with the patient. The patient was provided an opportunity to ask questions and all were answered. The patient agreed with the plan and demonstrated an understanding of the instructions.   The patient was advised to call back or seek an  in-person evaluation if the symptoms worsen or if the condition fails to improve as anticipated.  I provided 12 minutes of non-face-to-face time during this encounter.   Follow up instructions: Advised assistant Wendie Simmer to help patient arrange the following: -follow up with Dr. Maudie Mercury in 3-4 months  Lucretia Kern, DO

## 2019-06-06 DIAGNOSIS — H34832 Tributary (branch) retinal vein occlusion, left eye, with macular edema: Secondary | ICD-10-CM | POA: Diagnosis not present

## 2019-06-06 DIAGNOSIS — H353112 Nonexudative age-related macular degeneration, right eye, intermediate dry stage: Secondary | ICD-10-CM | POA: Diagnosis not present

## 2019-06-06 DIAGNOSIS — H348312 Tributary (branch) retinal vein occlusion, right eye, stable: Secondary | ICD-10-CM | POA: Diagnosis not present

## 2019-06-06 DIAGNOSIS — H35373 Puckering of macula, bilateral: Secondary | ICD-10-CM | POA: Diagnosis not present

## 2019-06-23 ENCOUNTER — Encounter: Payer: Self-pay | Admitting: Family Medicine

## 2019-06-23 ENCOUNTER — Other Ambulatory Visit: Payer: Self-pay

## 2019-06-23 ENCOUNTER — Ambulatory Visit (INDEPENDENT_AMBULATORY_CARE_PROVIDER_SITE_OTHER): Payer: PPO | Admitting: Family Medicine

## 2019-06-23 VITALS — BP 116/68 | HR 56

## 2019-06-23 DIAGNOSIS — I10 Essential (primary) hypertension: Secondary | ICD-10-CM | POA: Diagnosis not present

## 2019-06-23 DIAGNOSIS — R32 Unspecified urinary incontinence: Secondary | ICD-10-CM | POA: Diagnosis not present

## 2019-06-23 DIAGNOSIS — E039 Hypothyroidism, unspecified: Secondary | ICD-10-CM | POA: Diagnosis not present

## 2019-06-23 DIAGNOSIS — E785 Hyperlipidemia, unspecified: Secondary | ICD-10-CM

## 2019-06-23 NOTE — Progress Notes (Signed)
Virtual Visit via Telephone Note  I connected with Alison Cordova  on 06/23/19 at  1:30 PM EDT by telephone and verified that I am speaking with the correct person using two identifiers.   I discussed the limitations, risks, security and privacy concerns of performing an evaluation and management service by telephone and the availability of in person appointments. I also discussed with the patient that there may be a patient responsible charge related to this service. The patient expressed understanding and agreed to proceed.  Location patient: home Location provider: work office Participants present for the call: patient, provider Patient did not have a visit in the prior 7 days to address this/these issue(s).   History of Present Illness: She is doing well, no concerns. On phone with daughter today. Wants to go out to eat.   HTN: amlodipine, tenoretic, lisinopril. Blood pressure is doing much better now. Getting numbers 116/68, 123/63 and similar. Feeling ok and not light headed/dizzy. Does get headaches in back of head. Bone right behind ear. They think more sinus pressure. These are off and on. Daughter feels like when she herself feels congested - then she notes mom complains of this. Goes away with tylenol dose (single). Off and on for a week but then will be good for months.   GERD: no complaints of this. Not taking medications for this.  Hypothyroid:synthroid 52mcg -  Allergies:uses zyrtec and flonase (rare use); takes zyrtec daily.  CVD: blood pressure controlled and cholesterol well controlled.  Breast ca history. Cleared from oncology.  HL:on zocor. Tolerates. No side effects.   Follows with urology Dr. Vikki Ports regularly. Was getting myrbetriq samples from him. She is not taking this medication right now.    mood is ok. Just frustrated with being at home more.   Observations/Objective: Patient sounds cheerful and well on the phone. I do not appreciate any SOB. Speech and  thought processing are grossly intact. Patient reported vitals:  Assessment and Plan: 1. Essential hypertension Present stable.  Continue current medication.  2. Hypothyroidism, unspecified type Has been stable.  Continue current medication.  3. Hyperlipidemia, unspecified hyperlipidemia type Continue Zocor 20 mg.  4. Urinary incontinence, unspecified type Myrbetriq was too expensive.  She is doing okay without medication at this point.   Follow Up Instructions:  She already has follow-up visit scheduled with Dr. Maudie Mercury.  Future blood work will be discussed at that visit  I did not refer this patient for an OV in the next 24 hours for this/these issue(s).  I discussed the assessment and treatment plan with the patient. The patient was provided an opportunity to ask questions and all were answered. The patient agreed with the plan and demonstrated an understanding of the instructions.   The patient was advised to call back or seek an in-person evaluation if the symptoms worsen or if the condition fails to improve as anticipated.  I provided 25 minutes of non-face-to-face time during this encounter.   Micheline Rough, MD

## 2019-07-27 ENCOUNTER — Ambulatory Visit (INDEPENDENT_AMBULATORY_CARE_PROVIDER_SITE_OTHER): Payer: PPO

## 2019-07-27 ENCOUNTER — Other Ambulatory Visit: Payer: Self-pay

## 2019-07-27 DIAGNOSIS — Z23 Encounter for immunization: Secondary | ICD-10-CM

## 2019-08-02 DIAGNOSIS — N3946 Mixed incontinence: Secondary | ICD-10-CM | POA: Diagnosis not present

## 2019-08-02 DIAGNOSIS — R351 Nocturia: Secondary | ICD-10-CM | POA: Diagnosis not present

## 2019-08-08 ENCOUNTER — Encounter: Payer: Self-pay | Admitting: Family Medicine

## 2019-08-08 ENCOUNTER — Ambulatory Visit (INDEPENDENT_AMBULATORY_CARE_PROVIDER_SITE_OTHER): Payer: PPO | Admitting: Family Medicine

## 2019-08-08 ENCOUNTER — Other Ambulatory Visit: Payer: Self-pay

## 2019-08-08 DIAGNOSIS — E785 Hyperlipidemia, unspecified: Secondary | ICD-10-CM | POA: Diagnosis not present

## 2019-08-08 DIAGNOSIS — I10 Essential (primary) hypertension: Secondary | ICD-10-CM

## 2019-08-08 DIAGNOSIS — E039 Hypothyroidism, unspecified: Secondary | ICD-10-CM

## 2019-08-08 DIAGNOSIS — N182 Chronic kidney disease, stage 2 (mild): Secondary | ICD-10-CM

## 2019-08-08 NOTE — Progress Notes (Signed)
Virtual Visit via Telephone Note  I connected with Alison Cordova on 08/08/19 at 10:00 AM EDT by telephone and verified that I am speaking with the correct person using two identifiers.   I discussed the limitations, risks, security and privacy concerns of performing an evaluation and management service by telephone and the availability of in person appointments. I also discussed with the patient that there may be a patient responsible charge related to this service. The patient expressed understanding and agreed to proceed.  Location patient: home Location provider: work or home office Participants present for the call: patient, provider Patient did not have a visit in the prior 7 days to address this/these issue(s).   History of Present Illness:  Alison Cordova is a very pleasant 85 yo with a PMH significant for HTN, Seasonal allergies, Hypothyroidism, HLD, Obesity, OAB and a history of Anxiety and Depression. This is a phone check in for follow up. They prefer to avoid the office as much as possible due to the [place] pandemic. Daughter [name] and the patient report they have been doing well. She turns 89 soon. They went to the urologist last week and her BP was really good - about 120s or 68. She is taking Myrbetriq again and this does elevate the BP some to the 150s on the top. The bottom runs pretty low. Mood is great per daughter. She is staying home mostly due to the pandemic. She got her flu shot.   Observations/Objective: Patient sounds cheerful and well on the phone. I do not appreciate any SOB. Speech and thought processing are grossly intact. Patient reported vitals:  Assessment and Plan:  Hypertension Hypothyroidism OOB Hyperlipidemia  -labs per orders, they agree to come in for a quick lab visit for this -discussed precautions to take given age in the [place] pandemic -BP is good except when take the medication for OOB; elevated BP is common with this drug. Advised they will  have to way risks/benefits with urology and def would not advise if BP going ove 150s/80s.  -follow up 3-4 months, they prefer virtual or phone visit  I did not refer this patient for an OV in the next 24 hours for this/these issue(s).  I discussed the assessment and treatment plan with the patient. The patient was provided an opportunity to ask questions and all were answered. The patient agreed with the plan and demonstrated an understanding of the instructions.   The patient was advised to call back or seek an in-person evaluation if the symptoms worsen or if the condition fails to improve as anticipated.  I provided 16 minutes of non-face-to-face time during this encounter.   Lucretia Kern, DO

## 2019-08-14 ENCOUNTER — Other Ambulatory Visit: Payer: Self-pay

## 2019-08-14 ENCOUNTER — Other Ambulatory Visit (INDEPENDENT_AMBULATORY_CARE_PROVIDER_SITE_OTHER): Payer: PPO

## 2019-08-14 DIAGNOSIS — I10 Essential (primary) hypertension: Secondary | ICD-10-CM

## 2019-08-14 DIAGNOSIS — E039 Hypothyroidism, unspecified: Secondary | ICD-10-CM | POA: Diagnosis not present

## 2019-08-14 LAB — BASIC METABOLIC PANEL
BUN: 24 mg/dL — ABNORMAL HIGH (ref 6–23)
CO2: 29 mEq/L (ref 19–32)
Calcium: 10.4 mg/dL (ref 8.4–10.5)
Chloride: 101 mEq/L (ref 96–112)
Creatinine, Ser: 1.63 mg/dL — ABNORMAL HIGH (ref 0.40–1.20)
GFR: 29.57 mL/min — ABNORMAL LOW (ref >60.00)
Glucose, Bld: 96 mg/dL (ref 70–99)
Potassium: 4.4 mEq/L (ref 3.5–5.1)
Sodium: 142 mEq/L (ref 135–145)

## 2019-08-14 LAB — TSH: TSH: 2.89 u[IU]/mL (ref 0.35–4.50)

## 2019-08-14 LAB — HEMOGLOBIN A1C: Hgb A1c MFr Bld: 5.5 % (ref 4.6–6.5)

## 2019-08-22 ENCOUNTER — Other Ambulatory Visit: Payer: Self-pay | Admitting: *Deleted

## 2019-08-22 MED ORDER — LEVOTHYROXINE SODIUM 50 MCG PO TABS: 50.0000 ug | ORAL_TABLET | Freq: Every day | ORAL | 1 refills | Status: DC

## 2019-08-22 NOTE — Telephone Encounter (Signed)
Rx done. 

## 2019-08-29 DIAGNOSIS — H348312 Tributary (branch) retinal vein occlusion, right eye, stable: Secondary | ICD-10-CM | POA: Diagnosis not present

## 2019-08-29 DIAGNOSIS — H35373 Puckering of macula, bilateral: Secondary | ICD-10-CM | POA: Diagnosis not present

## 2019-08-29 DIAGNOSIS — H353112 Nonexudative age-related macular degeneration, right eye, intermediate dry stage: Secondary | ICD-10-CM | POA: Diagnosis not present

## 2019-08-29 DIAGNOSIS — H34832 Tributary (branch) retinal vein occlusion, left eye, with macular edema: Secondary | ICD-10-CM | POA: Diagnosis not present

## 2019-09-05 ENCOUNTER — Telehealth: Payer: Self-pay | Admitting: *Deleted

## 2019-09-05 NOTE — Telephone Encounter (Signed)
[facility] faxed a request for Simvastatin 20mg .  Message sent to Dr Ethlyn Gallery as last Rx was given by Dr Maudie Mercury.

## 2019-09-06 MED ORDER — SIMVASTATIN 20 MG PO TABS: 20.0000 mg | ORAL_TABLET | Freq: Every day | ORAL | 3 refills | Status: DC

## 2019-09-06 NOTE — Telephone Encounter (Signed)
[facility] for 90 w 3 refills

## 2019-09-06 NOTE — Telephone Encounter (Signed)
Rx done. 

## 2019-09-15 ENCOUNTER — Other Ambulatory Visit: Payer: Self-pay | Admitting: Family Medicine

## 2019-09-15 ENCOUNTER — Telehealth: Payer: Self-pay | Admitting: *Deleted

## 2019-09-15 MED ORDER — ATENOLOL-CHLORTHALIDONE 50-25 MG PO TABS: 1.0000 | ORAL_TABLET | Freq: Every day | ORAL | 3 refills | Status: DC

## 2019-09-15 MED ORDER — LISINOPRIL 20 MG PO TABS: 20.0000 mg | ORAL_TABLET | Freq: Every day | ORAL | 3 refills | Status: DC

## 2019-09-15 NOTE — Telephone Encounter (Signed)
[facility] faxed a refill request for Lisinopril 20mg  and Atenolol-chlorthalidone 50-25mg .  Message sent to Dr Ethlyn Gallery.

## 2019-09-15 NOTE — Telephone Encounter (Signed)
sent 

## 2019-10-04 ENCOUNTER — Telehealth: Payer: Self-pay | Admitting: *Deleted

## 2019-10-04 NOTE — Telephone Encounter (Signed)
Copied from [place] [id number]. Topic: General - Other >> Oct 04, 2019  4:15 PM Virl Axe D wrote: Reason for CRM: Pt's daughter [name] stated her sister Hoyle Sauer is trying to visit pt but she does not want to wear a mask. Pt's daughter stated she thinks her sister will try to contact Dr. Maudie Mercury about this and she wanted her to be aware. She is very cautious due to Covid 19 and wants to do everything she can to keep pt and her family safe.

## 2019-10-05 NOTE — Telephone Encounter (Signed)
I advise not having visitors unless they agree to wear a good 3 layer cloth mask and socially distance at all times - this is doubly important with any patients over the age of 45 or with health problems. Outside is better. Safest if virtual visit. It is not worth it. Many patients have died in our hospital, our [place] hospital if full right now and our [place] cases are up. Thank you.

## 2019-10-05 NOTE — Telephone Encounter (Signed)
Left a message for the pts daughter to return my call.  CRM also created.

## 2019-11-07 ENCOUNTER — Encounter: Payer: Self-pay | Admitting: Family Medicine

## 2019-11-07 ENCOUNTER — Telehealth (INDEPENDENT_AMBULATORY_CARE_PROVIDER_SITE_OTHER): Payer: PPO | Admitting: Family Medicine

## 2019-11-07 ENCOUNTER — Other Ambulatory Visit: Payer: Self-pay

## 2019-11-07 VITALS — BP 132/69 | HR 61

## 2019-11-07 DIAGNOSIS — E785 Hyperlipidemia, unspecified: Secondary | ICD-10-CM

## 2019-11-07 DIAGNOSIS — E039 Hypothyroidism, unspecified: Secondary | ICD-10-CM

## 2019-11-07 DIAGNOSIS — I10 Essential (primary) hypertension: Secondary | ICD-10-CM

## 2019-11-07 DIAGNOSIS — N182 Chronic kidney disease, stage 2 (mild): Secondary | ICD-10-CM | POA: Diagnosis not present

## 2019-11-07 NOTE — Progress Notes (Signed)
Virtual Visit via Telephone Note  I connected with DORELLA MIHALOVICH on 11/07/19 at 10:00 AM EST by telephone and verified that I am speaking with the correct person using two identifiers.   I discussed the limitations, risks, security and privacy concerns of performing an evaluation and management service by telephone and the availability of in person appointments. I also discussed with the patient that there may be a patient responsible charge related to this service. The patient expressed understanding and agreed to proceed.  Location patient: home Location provider: work or home office Participants present for the call: patient, provider, patient's daughter Patient did not have a visit in the prior 7 days to address this/these issue(s).  History of Present Illness:  Phone check in per patient and family preference given the pandemic and high risk patient. Currently on pharmacological treatment for HTN, HLD, CKD, Hypothyroidism and OOB. See urologist. Had labs in September. Renal function mildly decreased from prior.  They reports she has been doing well. She has been going out for walks with her daughter with the cane. Feels safe and does not feel like she is going to fall. She has been healthy. Reports feels well. Eating and drinking good. Mood is good.    Observations/Objective: Patient sounds cheerful and well on the phone. I do not appreciate any SOB. Speech and thought processing are grossly intact. Patient reported vitals: Vitals:   11/07/19 0834  BP: 132/69  Pulse: 61    Assessment and Plan:  Essential hypertension  Hyperlipidemia, unspecified hyperlipidemia type  Hypothyroidism, unspecified type  Chronic kidney disease (CKD), stage II (mild)  -from report she seems to be doing quite well at 61! -went over last labs including renal function, they prefer to avoid coming in for labs right now due to the pandemic and increased rates of spread - they have been trying to keep  her home as much as possible, they agree to ensure adequate hydration and avoid nsaids, nephrotoxic drugs -they prefer to schedule phone follow up for next visit as well -cont current medicaitons  Follow Up Instructions:   I did not refer this patient for an OV in the next 24 hours for this/these issue(s).  I discussed the assessment and treatment plan with the patient. The patient was provided an opportunity to ask questions and all were answered. The patient agreed with the plan and demonstrated an understanding of the instructions.   The patient was advised to call back or seek an in-person evaluation if the symptoms worsen or if the condition fails to improve as anticipated.  I provided 11 minutes of non-face-to-face time during this encounter.   Lucretia Kern, DO

## 2019-11-15 ENCOUNTER — Other Ambulatory Visit: Payer: Self-pay | Admitting: Family Medicine

## 2019-11-15 MED ORDER — AMLODIPINE BESYLATE 5 MG PO TABS: 5.0000 mg | ORAL_TABLET | Freq: Every day | ORAL | 1 refills | Status: DC

## 2019-11-15 NOTE — Telephone Encounter (Signed)
Medication Refill - Medication: amLODipine (NORVASC) 5 MG tablet    Preferred Pharmacy (with phone number or street name):  [id number] - [place], [place] Phone:  [phone]  Fax:  [phone]       Agent: Please be advised that RX refills may take up to 3 business days. We ask that you follow-up with your pharmacy.

## 2019-11-23 ENCOUNTER — Other Ambulatory Visit: Payer: Self-pay | Admitting: *Deleted

## 2019-11-23 MED ORDER — AMLODIPINE BESYLATE 5 MG PO TABS: 5.0000 mg | ORAL_TABLET | Freq: Every day | ORAL | 1 refills | Status: DC

## 2019-11-28 DIAGNOSIS — H353132 Nonexudative age-related macular degeneration, bilateral, intermediate dry stage: Secondary | ICD-10-CM | POA: Diagnosis not present

## 2019-11-28 DIAGNOSIS — H31092 Other chorioretinal scars, left eye: Secondary | ICD-10-CM | POA: Diagnosis not present

## 2019-11-28 DIAGNOSIS — H34832 Tributary (branch) retinal vein occlusion, left eye, with macular edema: Secondary | ICD-10-CM | POA: Diagnosis not present

## 2019-11-28 DIAGNOSIS — H348312 Tributary (branch) retinal vein occlusion, right eye, stable: Secondary | ICD-10-CM | POA: Diagnosis not present

## 2019-11-30 DIAGNOSIS — N3946 Mixed incontinence: Secondary | ICD-10-CM | POA: Diagnosis not present

## 2019-11-30 DIAGNOSIS — R350 Frequency of micturition: Secondary | ICD-10-CM | POA: Diagnosis not present

## 2019-12-21 ENCOUNTER — Ambulatory Visit (INDEPENDENT_AMBULATORY_CARE_PROVIDER_SITE_OTHER): Payer: PPO

## 2019-12-21 VITALS — BP 150/65 | HR 62 | Ht 60.0 in | Wt 115.0 lb

## 2019-12-21 DIAGNOSIS — Z0000 Encounter for general adult medical examination without abnormal findings: Secondary | ICD-10-CM

## 2019-12-21 NOTE — Progress Notes (Signed)
This visit is being conducted via phone call due to the COVID-19 pandemic. This patient has given me verbal consent via phone to conduct this visit, patient states they are participating from their home address. Some vital signs may be absent or patient reported.   Patient identification: identified by name, DOB, and current address.  Location provider: [facility], Office Persons participating in the virtual visit: [name] and [name], LPN.    Subjective:   Alison Cordova is a 86 y.o. female who presents for Medicare Annual (Subsequent) preventive examination.  Alison Cordova is stable at this time. She lives with her daughter Alison Cordova and son-in-law. She walks occasionally inside the home. Her daughter reports a small appetite. She sleeps most of the night and rarely gets up during the night to urinate since taking myrbetriq.   Review of Systems:  No ROS: Annual Medicare Wellness Subsequent Visit  Cardiac Risk Factors include: advanced age (>51men, >22 women);dyslipidemia     Objective:     Vitals: BP (!) 150/65   Pulse 62   Ht 5' (1.524 m)   Wt 115 lb (52.2 kg)   BMI 22.46 kg/m   Body mass index is 22.46 kg/m.  Advanced Directives 12/21/2019 09/02/2017 11/28/2014 11/28/2014  Does Patient Have a Medical Advance Directive? Yes Yes Yes Yes  Type of Paramedic of [place];Living will - [place];Living will Living will  Does patient want to make changes to medical advance directive? No - Patient declined - No - Patient declined No - Patient declined  Copy of [place] in Chart? No - copy requested - No - copy requested No - copy requested    Tobacco Social History   Tobacco Use  Smoking Status Former Smoker  . Types: Cigarettes  . Quit date: 11/23/1962  . Years since quitting: 57.1  Smokeless Tobacco Never Used     Counseling given: Not Answered   Clinical Intake:  Pre-visit preparation completed:  Yes  Pain : No/denies pain     BMI - recorded: 22.46 Nutritional Status: BMI of 19-24  Normal     Interpreter Needed?: No     Past Medical History:  Diagnosis Date  . Allergic rhinitis   . Anemia 06/17/2012  . Anxiety and depression   . Cataract 06/17/2012  . Chronic kidney disease   . CVA (cerebral vascular accident) ([place])   . Diverticulosis 03/14/2008   Qualifier: Diagnosis of  By: Lenna Gilford MD, Deborra Medina   . Glaucoma   . Hemorrhoids 07/03/2011  . Hx of breast cancer    Left  . Hyperlipemia   . Hyperlipidemia   . Hypertension   . Hypothyroid   . Irritable bowel syndrome 09/17/2007   Qualifier: Diagnosis of  By: Rosana Hoes CMA, Tammy    . Lung nodule 07/06/2011  . Peripheral neuropathy   . Urinary incontinence    Past Surgical History:  Procedure Laterality Date  .  FUNDIPLICATION    . BREAST SURGERY     left breast mastectomy   . EYE SURGERY     TO DESOLVE AN AREA IN HER LEFT EYE?  . SPINE SURGERY    . VAGINAL HYSTERECTOMY     History reviewed. No pertinent family history. Social History   Socioeconomic History  . Marital status: Widowed    Spouse name: Not on file  . Number of children: 3  . Years of education: Not on file  . Highest education level: Some college,  no degree  Occupational History  . Not on file  Tobacco Use  . Smoking status: Former Smoker    Types: Cigarettes    Quit date: 11/23/1962    Years since quitting: 55.1  . Smokeless tobacco: Never Used  Substance and Sexual Activity  . Alcohol use: Yes    Comment: PT STATES DRINKS A BEER OR WINE OCCASSIONALLY, NOT WEEKLY  . Drug use: No  . Sexual activity: Not on file  Other Topics Concern  . Not on file  Social History Narrative   Work or School: none      Home Situation: lives with daughter and son and law      Spiritual Beliefs: [employer]      Lifestyle: no regular exercise; diet is ok            Scientist, physiological Strain: Low Risk   .  Difficulty of Paying Living Expenses: Not hard at all  Food Insecurity: No Food Insecurity  . Worried About Charity fundraiser in the Last Year: Never true  . Ran Out of Food in the Last Year: Never true  Transportation Needs: No Transportation Needs  . Lack of Transportation (Medical): No  . Lack of Transportation (Non-Medical): No  Physical Activity: Insufficiently Active  . Days of Exercise per Week: 7 days  . Minutes of Exercise per Session: 20 min  Stress: No Stress Concern Present  . Feeling of Stress : Not at all  Social Connections: Unknown  . Frequency of Communication with Friends and Family: More than three times a week  . Frequency of Social Gatherings with Friends and Family: Once a week  . Attends Religious Services: Not on file  . Active Member of Clubs or Organizations: Not on file  . Attends Archivist Meetings: Not on file  . Marital Status: Widowed    Outpatient Encounter Medications as of 12/21/2019  Medication Sig  . acetaminophen (TYLENOL) 650 MG CR tablet Take 650 mg by mouth every 8 (eight) hours as needed.    Marland Kitchen amLODipine (NORVASC) 5 MG tablet Take 1 tablet (5 mg total) by mouth daily.  Marland Kitchen aspirin 325 MG tablet Take 325 mg by mouth daily.    Marland Kitchen atenolol-chlorthalidone (TENORETIC) 50-25 MG tablet Take 1 tablet by mouth daily.  . cetirizine (ZYRTEC) 10 MG tablet Take 1 tablet (10 mg total) by mouth daily.  . fluocinonide cream (LIDEX) 0.05 % Apply topically 2 (two) times daily.  . fluticasone (FLONASE) 50 MCG/ACT nasal spray Place 2 sprays into both nostrils daily.  Marland Kitchen latanoprost (XALATAN) 0.005 % ophthalmic solution Place 1 drop into both eyes at bedtime.    Marland Kitchen levothyroxine (SYNTHROID) 50 MCG tablet Take 1 tablet (50 mcg total) by mouth daily.  Marland Kitchen lisinopril (ZESTRIL) 20 MG tablet Take 1 tablet (20 mg total) by mouth daily.  . Mirabegron (MYRBETRIQ PO) Take 25 mg by mouth 3 (three) times a week.   . Multiple Vitamins-Minerals (WOMENS MULTIVITAMIN PLUS  PO) Take 1 tablet by mouth daily.    . NON FORMULARY Post Surgical Bras  . NON FORMULARY Non-Silicone Breast Prosthesis  . NON FORMULARY Silicone Breast Prosthesis  . simvastatin (ZOCOR) 20 MG tablet Take 1 tablet (20 mg total) by mouth at bedtime.  . timolol (BETIMOL) 0.5 % ophthalmic solution Place 1 drop into both eyes 2 (two) times daily.  Marland Kitchen tobramycin (TOBREX) 0.3 % ophthalmic ointment Place 1 application into the left eye as needed.  Marland Kitchen  UNABLE TO FIND 174.9 Left mastectomy   L8030-Silicone Breast Prosthesis-1   No facility-administered encounter medications on file as of 12/21/2019.    Activities of Daily Living In your present state of health, do you have any difficulty performing the following activities: 12/21/2019  Hearing? Y  Vision? N  Difficulty concentrating or making decisions? Y  Walking or climbing stairs? N  Dressing or bathing? N  Doing errands, shopping? Y  Preparing Food and eating ? N  Using the Toilet? N  In the past six months, have you accidently leaked urine? Y  Do you have problems with loss of bowel control? N  Managing your Medications? Y  Managing your Finances? Y  Housekeeping or managing your Housekeeping? Y  Some recent data might be hidden    Patient Care Team: Caren Macadam, MD as PCP - General (Family Medicine) Lafayette Dragon, MD (Inactive) (Gastroenterology) Heath Lark, MD as Consulting Physician (Hematology and Oncology) Bjorn Loser, MD as Consulting Physician (Urology) Sherlynn Stalls, MD as Consulting Physician (Ophthalmology)    Assessment:   This is a routine wellness examination for [place].  Exercise Activities and Dietary recommendations Current Exercise Habits: The patient does not participate in regular exercise at present  Goals    . Increase physical activity     Try to walk more often and longer duration       Fall Risk Fall Risk  12/21/2019 11/21/2018 09/02/2017 03/01/2017 07/30/2016  Falls in the past year? 0 0  Yes No No  Number falls in past yr: - 0 1 - -  Injury with Fall? - 0 - - -  Risk for fall due to : Medication side effect - (No Data) - -  Risk for fall due to: Comment - - grand son grabbed her and she got off balance  - -  Follow up Falls evaluation completed;Education provided;Falls prevention discussed - Education provided - -   Is the patient's home free of loose throw rugs in walkways, pet beds, electrical cords, etc?   yes      Grab bars in the bathroom? yes      Handrails on the stairs?   yes      Adequate lighting?   yes  Timed Get Up and Go performed: N/A due to telephone visit  Depression Screen PHQ 2/9 Scores 12/21/2019 06/23/2019 01/11/2018 09/06/2017  PHQ - 2 Score 0 1 1 4   PHQ- 9 Score - - - 10     Cognitive Function MMSE - Mini Mental State Exam 09/02/2017  Orientation to time 3  Orientation to Place 5  Registration 3  Attention/ Calculation 5  Recall 1  Language- name 2 objects 2  Language- repeat 1  Language- follow 3 step command 3  Language- read & follow direction 1  Write a sentence 1  Copy design 1  Total score 26     6CIT Screen 12/21/2019  What Year? 4 points  What month? 3 points  What time? 3 points  Count back from 20 0 points  Months in reverse 2 points  Repeat phrase 0 points  Total Score 12    Immunization History  Administered Date(s) Administered  . Fluad Quad(high Dose 65+) 07/27/2019  . Influenza Split 08/13/2011, 08/26/2012  . Influenza Whole 08/21/2008, 08/21/2009  . Influenza, High Dose Seasonal PF 08/19/2015, 07/30/2016  . Influenza,inj,Quad PF,6+ Mos 08/27/2014, 08/20/2017, 08/15/2018  . Influenza-Unspecified 08/23/2013  . Pneumococcal Conjugate-13 08/27/2014  . Pneumococcal Polysaccharide-23 09/26/2015  . Td 02/19/2010  .  Zoster 03/28/2015    Qualifies for Shingles Vaccine?yes  Screening Tests Health Maintenance  Topic Date Due  . MAMMOGRAM  11/30/2021 (Originally 09/13/2015)  . TETANUS/TDAP  02/20/2020  .  INFLUENZA VACCINE  Completed  . DEXA SCAN  Completed  . PNA vac Low Risk Adult  Completed    Cancer Screenings: Lung: Low Dose CT Chest recommended if Age 6-80 years, 30 pack-year currently smoking OR have quit w/in 15years. Patient does not qualify. Breast:  Up to date on Mammogram? Yes ; aged out Up to date of Bone Density/Dexa? Yes; aged out Colorectal: yes; aged out  Additional Screenings:  Hepatitis C Screening: N/A due to age.    Plan:    Ms. Zimmerle will obtain covid vaccines when she is able to get them. She does plan to have an updated Tdap after completion of covid vaccines and will also check on shingrix vaccines as well.   I have personally reviewed and noted the following in the patient's chart:   . Medical and social history . Use of alcohol, tobacco or illicit drugs  . Current medications and supplements . Functional ability and status . Nutritional status . Physical activity . Advanced directives . List of other physicians . Hospitalizations, surgeries, and ER visits in previous 12 months . Vitals . Screenings to include cognitive, depression, and falls . Referrals and appointments  In addition, I have reviewed and discussed with patient certain preventive protocols, quality metrics, and best practice recommendations. A written personalized care plan for preventive services as well as general preventive health recommendations were provided to patient.     [name], LPN  [phone]

## 2019-12-21 NOTE — Patient Instructions (Addendum)
Alison Cordova , Thank you for taking time to come for your Medicare Wellness Visit. I appreciate your ongoing commitment to your health goals. Please review the following plan we discussed and let me know if I can assist you in the future.   Screening recommendations/referrals: Colorectal Screening: no longer needed due to advanced age 86: no longer needed due to advanced age Bone Density: no longer necessary due to advanced age.   Vision and Dental Exams: Recommended annual ophthalmology exams for early detection of glaucoma and other disorders of the eye. She sees eye provider every 2-3 months for macular degeneration injections. Recommended annual dental exams for proper oral hygiene  Diabetic Exams: Diabetic Eye Exam: N/A Diabetic Foot Exam: N/A  Vaccinations: Influenza vaccine: completed 07/27/2019; due again in Fall 2021. Pneumococcal vaccine: completed 08/27/2014 & 09/26/2015. Tdap vaccine: completed 02/19/2010; due again 02/20/2020. May obtain this in our office. Medicare does not cover cost and you will need to pay out of pocket if you chose to update.  Shingles vaccine: Please call your pharmacy to determine your out of pocket expense for the Shingrix vaccine. You may receive this vaccine at your local pharmacy. This is a series of two injections to be given 2-6 months apart.  Advanced directives: Advance directives discussed with you today.  Please bring a copy of your POA (Power of [place]) and/or Living Will to your next appointment.  Goals: Recommend to drink at least 6-8 8oz glasses of water per day.  Try to increase your physical activity by walking more often and longer in duration.  Recommend to remove any items from the home that may cause slips or trips.   Next appointment: Please schedule your Annual Wellness Visit with your Nurse Health Advisor in one year.  Preventive Care 64 Years and Older, Female Preventive care refers to lifestyle choices and visits  with your health care provider that can promote health and wellness. What does preventive care include?  A yearly physical exam. This is also called an annual well check.  Dental exams once or twice a year.  Routine eye exams. Ask your health care provider how often you should have your eyes checked.  Personal lifestyle choices, including:  Daily care of your teeth and gums.  Regular physical activity.  Eating a healthy diet.  Avoiding tobacco and drug use.  Limiting alcohol use.  Practicing safe sex.  Taking low-dose aspirin every day if recommended by your health care provider.  Taking vitamin and mineral supplements as recommended by your health care provider. What happens during an annual well check? The services and screenings done by your health care provider during your annual well check will depend on your age, overall health, lifestyle risk factors, and family history of disease. Counseling  Your health care provider may ask you questions about your:  Alcohol use.  Tobacco use.  Drug use.  Emotional well-being.  Home and relationship well-being.  Sexual activity.  Eating habits.  History of falls.  Memory and ability to understand (cognition).  Work and work Statistician.  Reproductive health. Screening  You may have the following tests or measurements:  Height, weight, and BMI.  Blood pressure.  Lipid and cholesterol levels. These may be checked every 5 years, or more frequently if you are over 69 years old.  Skin check.  Lung cancer screening. You may have this screening every year starting at age 19 if you have a 30-pack-year history of smoking and currently smoke or have quit within the  past 15 years.  Fecal occult blood test (FOBT) of the stool. You may have this test every year starting at age 56.  Flexible sigmoidoscopy or colonoscopy. You may have a sigmoidoscopy every 5 years or a colonoscopy every 10 years starting at age  66.  Hepatitis C blood test.  Hepatitis B blood test.  Sexually transmitted disease (STD) testing.  Diabetes screening. This is done by checking your blood sugar (glucose) after you have not eaten for a while (fasting). You may have this done every 1-3 years.  Bone density scan. This is done to screen for osteoporosis. You may have this done starting at age 84.  Mammogram. This may be done every 1-2 years. Talk to your health care provider about how often you should have regular mammograms. Talk with your health care provider about your test results, treatment options, and if necessary, the need for more tests. Vaccines  Your health care provider may recommend certain vaccines, such as:  Influenza vaccine. This is recommended every year.  Tetanus, diphtheria, and acellular pertussis (Tdap, Td) vaccine. You may need a Td booster every 10 years.  Zoster vaccine. You may need this after age 6.  Pneumococcal 13-valent conjugate (PCV13) vaccine. One dose is recommended after age 69.  Pneumococcal polysaccharide (PPSV23) vaccine. One dose is recommended after age 16. Talk to your health care provider about which screenings and vaccines you need and how often you need them. This information is not intended to replace advice given to you by your health care provider. Make sure you discuss any questions you have with your health care provider. Document Released: 12/06/2015 Document Revised: 07/29/2016 Document Reviewed: 09/10/2015 Elsevier Interactive Patient Education  2017 [place] Prevention in the Home Falls can cause injuries. They can happen to people of all ages. There are many things you can do to make your home safe and to help prevent falls. What can I do on the outside of my home?  Regularly fix the edges of walkways and driveways and fix any cracks.  Remove anything that might make you trip as you walk through a door, such as a raised step or threshold.  Trim  any bushes or trees on the path to your home.  Use bright outdoor lighting.  Clear any walking paths of anything that might make someone trip, such as rocks or tools.  Regularly check to see if handrails are loose or broken. Make sure that both sides of any steps have handrails.  Any raised decks and porches should have guardrails on the edges.  Have any leaves, snow, or ice cleared regularly.  Use sand or salt on walking paths during winter.  Clean up any spills in your garage right away. This includes oil or grease spills. What can I do in the bathroom?  Use night lights.  Install grab bars by the toilet and in the tub and shower. Do not use towel bars as grab bars.  Use non-skid mats or decals in the tub or shower.  If you need to sit down in the shower, use a plastic, non-slip stool.  Keep the floor dry. Clean up any water that spills on the floor as soon as it happens.  Remove soap buildup in the tub or shower regularly.  Attach bath mats securely with double-sided non-slip rug tape.  Do not have throw rugs and other things on the floor that can make you trip. What can I do in the bedroom?  Use night lights.  Make sure that you have a light by your bed that is easy to reach.  Do not use any sheets or blankets that are too big for your bed. They should not hang down onto the floor.  Have a firm chair that has side arms. You can use this for support while you get dressed.  Do not have throw rugs and other things on the floor that can make you trip. What can I do in the kitchen?  Clean up any spills right away.  Avoid walking on wet floors.  Keep items that you use a lot in easy-to-reach places.  If you need to reach something above you, use a strong step stool that has a grab bar.  Keep electrical cords out of the way.  Do not use floor polish or wax that makes floors slippery. If you must use wax, use non-skid floor wax.  Do not have throw rugs and other  things on the floor that can make you trip. What can I do with my stairs?  Do not leave any items on the stairs.  Make sure that there are handrails on both sides of the stairs and use them. Fix handrails that are broken or loose. Make sure that handrails are as long as the stairways.  Check any carpeting to make sure that it is firmly attached to the stairs. Fix any carpet that is loose or worn.  Avoid having throw rugs at the top or bottom of the stairs. If you do have throw rugs, attach them to the floor with carpet tape.  Make sure that you have a light switch at the top of the stairs and the bottom of the stairs. If you do not have them, ask someone to add them for you. What else can I do to help prevent falls?  Wear shoes that:  Do not have high heels.  Have rubber bottoms.  Are comfortable and fit you well.  Are closed at the toe. Do not wear sandals.  If you use a stepladder:  Make sure that it is fully opened. Do not climb a closed stepladder.  Make sure that both sides of the stepladder are locked into place.  Ask someone to hold it for you, if possible.  Clearly mark and make sure that you can see:  Any grab bars or handrails.  First and last steps.  Where the edge of each step is.  Use tools that help you move around (mobility aids) if they are needed. These include:  Canes.  Walkers.  Scooters.  Crutches.  Turn on the lights when you go into a dark area. Replace any light bulbs as soon as they burn out.  Set up your furniture so you have a clear path. Avoid moving your furniture around.  If any of your floors are uneven, fix them.  If there are any pets around you, be aware of where they are.  Review your medicines with your doctor. Some medicines can make you feel dizzy. This can increase your chance of falling. Ask your doctor what other things that you can do to help prevent falls. This information is not intended to replace advice given to  you by your health care provider. Make sure you discuss any questions you have with your health care provider. Document Released: 09/05/2009 Document Revised: 04/16/2016 Document Reviewed: 12/14/2014 Elsevier Interactive Patient Education  2017 [employer].

## 2020-01-06 ENCOUNTER — Ambulatory Visit: Payer: PPO | Attending: Internal Medicine

## 2020-01-06 DIAGNOSIS — Z23 Encounter for immunization: Secondary | ICD-10-CM

## 2020-01-06 NOTE — Progress Notes (Signed)
   Covid-19 Vaccination Clinic  Name:  Alison Cordova    MRN: [id number] DOB: [birth year]  01/06/2020  Ms. [name] was observed post Covid-19 immunization for 15 minutes without incidence. She was provided with Vaccine Information Sheet and instruction to access the V-Safe system.   Ms. [name] was instructed to call 911 with any severe reactions post vaccine: Marland Kitchen Difficulty breathing  . Swelling of your face and throat  . A fast heartbeat  . A bad rash all over your body  . Dizziness and weakness    Immunizations Administered    Name Date Dose VIS Date Route   Pfizer COVID-19 Vaccine 01/06/2020  2:21 PM 0.3 mL 11/03/2019 Intramuscular   Manufacturer: [place]   Lot: [id number]   [place]: [id number]

## 2020-01-07 ENCOUNTER — Ambulatory Visit: Payer: PPO

## 2020-01-29 ENCOUNTER — Ambulatory Visit: Payer: PPO | Attending: Internal Medicine

## 2020-01-29 DIAGNOSIS — Z23 Encounter for immunization: Secondary | ICD-10-CM | POA: Insufficient documentation

## 2020-01-29 NOTE — Progress Notes (Signed)
   Covid-19 Vaccination Clinic  Name:  MONALISA GOOS    MRN: [id number] DOB: March 14, 1926  01/29/2020  Ms. Zumbro was observed post Covid-19 immunization for 15 minutes without incident. She was provided with Vaccine Information Sheet and instruction to access the V-Safe system.   Ms. Lydia was instructed to call 911 with any severe reactions post vaccine: Marland Kitchen Difficulty breathing  . Swelling of face and throat  . A fast heartbeat  . A bad rash all over body  . Dizziness and weakness   Immunizations Administered    Name Date Dose VIS Date Route   Pfizer COVID-19 Vaccine 01/29/2020  2:24 PM 0.3 mL 11/03/2019 Intramuscular   Manufacturer: [place]   Lot: [id number]   [place]: [id number]

## 2020-02-06 ENCOUNTER — Other Ambulatory Visit: Payer: Self-pay

## 2020-02-06 ENCOUNTER — Telehealth: Payer: Self-pay | Admitting: Family Medicine

## 2020-02-06 ENCOUNTER — Encounter: Payer: Self-pay | Admitting: Family Medicine

## 2020-02-06 ENCOUNTER — Telehealth (INDEPENDENT_AMBULATORY_CARE_PROVIDER_SITE_OTHER): Payer: PPO | Admitting: Family Medicine

## 2020-02-06 VITALS — BP 117/60 | HR 63 | Wt 110.0 lb

## 2020-02-06 DIAGNOSIS — N182 Chronic kidney disease, stage 2 (mild): Secondary | ICD-10-CM

## 2020-02-06 DIAGNOSIS — I10 Essential (primary) hypertension: Secondary | ICD-10-CM

## 2020-02-06 DIAGNOSIS — D649 Anemia, unspecified: Secondary | ICD-10-CM

## 2020-02-06 DIAGNOSIS — E039 Hypothyroidism, unspecified: Secondary | ICD-10-CM

## 2020-02-06 MED ORDER — AMLODIPINE BESYLATE 5 MG PO TABS: 5.0000 mg | ORAL_TABLET | Freq: Every day | ORAL | 3 refills | Status: DC

## 2020-02-06 NOTE — Telephone Encounter (Signed)
Pt is set for lab 3/24 and follow up on 6/15

## 2020-02-06 NOTE — Telephone Encounter (Signed)
-----   Message from Lucretia Kern, DO sent at 02/06/2020 10:20 AM EDT ----- Please assist:-lab appointment in next 2 weeks, does NOT need to fast-follow up with Dr. Maudie Mercury in 3-4 months, virtual visit

## 2020-02-06 NOTE — Progress Notes (Signed)
Virtual Visit via Telephone Note  I connected with Alison Cordova on 02/06/20 at 10:00 AM EDT by telephone and verified that I am speaking with the correct person using two identifiers.   I discussed the limitations, risks, security and privacy concerns of performing an evaluation and management service by telephone and the availability of in person appointments. I also discussed with the patient that there may be a patient responsible charge related to this service. The patient expressed understanding and agreed to proceed.  Location patient: home Location provider: work or home office Participants present for the call: patient, provider Patient did not have a visit in the prior 7 days to address this/these issue(s).   History of Present Illness:   Daughter and patient report she is doing great. She had both of her COVID19 vaccines. They request that her amlodipine be sent to the mail pharmacy. They are willing to bring the patient for labs. No complaints today. Denies any further bowel bleeding, change in energy, CP, SOB or BP issues. Reports BP usually runs in the 1 teens over 109s.  Observations/Objective: Patient sounds cheerful and well on the phone. I do not appreciate any SOB. Speech and thought processing are grossly intact. Patient reported vitals:  Assessment and Plan:  Essential hypertension - Plan: Basic metabolic panel, CBC,  -sent rx to pharmacy of their choice per request -cont to monitor at home -cont current meds -labs  Hypothyroidism, unspecified type - Plan: TSH -lab check -cont levothyroxine  Chronic kidney disease (CKD), stage II (mild) - Plan: Basic metabolic panel -plenty of water -check labs -cont goo BP controll  Follow Up Instructions:  Sent message to schedulers to assist with lab visit in the next few weeks. Sent message to schedulers to assist with follow up in 3-4 months.  I did not refer this patient for an OV in the next 24 hours for  this/these issue(s).  I discussed the assessment and treatment plan with the patient. The patient was provided an opportunity to ask questions and all were answered. The patient agreed with the plan and demonstrated an understanding of the instructions.   The patient was advised to call back or seek an in-person evaluation if the symptoms worsen or if the condition fails to improve as anticipated.  I provided 15 minutes of non-face-to-face time during this encounter.   Lucretia Kern, DO

## 2020-02-13 ENCOUNTER — Other Ambulatory Visit: Payer: Self-pay

## 2020-02-14 ENCOUNTER — Other Ambulatory Visit (INDEPENDENT_AMBULATORY_CARE_PROVIDER_SITE_OTHER): Payer: PPO

## 2020-02-14 DIAGNOSIS — N182 Chronic kidney disease, stage 2 (mild): Secondary | ICD-10-CM

## 2020-02-14 DIAGNOSIS — E039 Hypothyroidism, unspecified: Secondary | ICD-10-CM | POA: Diagnosis not present

## 2020-02-14 DIAGNOSIS — I10 Essential (primary) hypertension: Secondary | ICD-10-CM

## 2020-02-14 LAB — CBC
HCT: 38.2 % (ref 36.0–46.0)
Hemoglobin: 13 g/dL (ref 12.0–15.0)
MCHC: 33.9 g/dL (ref 30.0–36.0)
MCV: 92 fl (ref 78.0–100.0)
Platelets: 245 10*3/uL (ref 150.0–400.0)
RBC: 4.16 Mil/uL (ref 3.87–5.11)
RDW: 13.8 % (ref 11.5–15.5)
WBC: 6.2 10*3/uL (ref 4.0–10.5)

## 2020-02-14 LAB — TSH: TSH: 2.61 u[IU]/mL (ref 0.35–4.50)

## 2020-02-14 LAB — BASIC METABOLIC PANEL
BUN: 30 mg/dL — ABNORMAL HIGH (ref 6–23)
CO2: 30 mEq/L (ref 19–32)
Calcium: 10.5 mg/dL (ref 8.4–10.5)
Chloride: 103 mEq/L (ref 96–112)
Creatinine, Ser: 1.58 mg/dL — ABNORMAL HIGH (ref 0.40–1.20)
GFR: 30.62 mL/min — ABNORMAL LOW (ref >60.00)
Glucose, Bld: 65 mg/dL — ABNORMAL LOW (ref 70–99)
Potassium: 4.1 mEq/L (ref 3.5–5.1)
Sodium: 140 mEq/L (ref 135–145)

## 2020-03-06 DIAGNOSIS — H34832 Tributary (branch) retinal vein occlusion, left eye, with macular edema: Secondary | ICD-10-CM | POA: Diagnosis not present

## 2020-03-06 DIAGNOSIS — H353132 Nonexudative age-related macular degeneration, bilateral, intermediate dry stage: Secondary | ICD-10-CM | POA: Diagnosis not present

## 2020-03-06 DIAGNOSIS — H348312 Tributary (branch) retinal vein occlusion, right eye, stable: Secondary | ICD-10-CM | POA: Diagnosis not present

## 2020-03-06 DIAGNOSIS — H35373 Puckering of macula, bilateral: Secondary | ICD-10-CM | POA: Diagnosis not present

## 2020-03-22 ENCOUNTER — Other Ambulatory Visit: Payer: Self-pay | Admitting: Family Medicine

## 2020-03-22 ENCOUNTER — Telehealth: Payer: Self-pay | Admitting: *Deleted

## 2020-03-22 MED ORDER — LEVOTHYROXINE SODIUM 50 MCG PO TABS: 50.0000 ug | ORAL_TABLET | Freq: Every day | ORAL | 3 refills | Status: DC

## 2020-03-22 NOTE — Telephone Encounter (Signed)
done

## 2020-03-22 NOTE — Telephone Encounter (Signed)
[facility] faxed a refill request for Levothyroxine 87mcg tablet.  Message sent to PCP.

## 2020-03-28 DIAGNOSIS — R350 Frequency of micturition: Secondary | ICD-10-CM | POA: Diagnosis not present

## 2020-03-28 DIAGNOSIS — N3946 Mixed incontinence: Secondary | ICD-10-CM | POA: Diagnosis not present

## 2020-04-08 ENCOUNTER — Other Ambulatory Visit: Payer: Self-pay

## 2020-04-08 ENCOUNTER — Encounter: Payer: Self-pay | Admitting: Family Medicine

## 2020-04-08 ENCOUNTER — Telehealth: Payer: Self-pay | Admitting: Family Medicine

## 2020-04-08 ENCOUNTER — Ambulatory Visit (INDEPENDENT_AMBULATORY_CARE_PROVIDER_SITE_OTHER): Payer: PPO | Admitting: Family Medicine

## 2020-04-08 VITALS — BP 118/60 | HR 64 | Temp 97.9°F | Wt 110.8 lb

## 2020-04-08 DIAGNOSIS — R300 Dysuria: Secondary | ICD-10-CM

## 2020-04-08 DIAGNOSIS — N390 Urinary tract infection, site not specified: Secondary | ICD-10-CM | POA: Diagnosis not present

## 2020-04-08 DIAGNOSIS — R319 Hematuria, unspecified: Secondary | ICD-10-CM | POA: Diagnosis not present

## 2020-04-08 LAB — POCT URINALYSIS DIPSTICK
Bilirubin, UA: NEGATIVE
Blood, UA: POSITIVE
Glucose, UA: NEGATIVE
Ketones, UA: NEGATIVE
Nitrite, UA: NEGATIVE
Protein, UA: NEGATIVE
Spec Grav, UA: 1.01 (ref 1.010–1.025)
Urobilinogen, UA: 0.2 E.U./dL
pH, UA: 7 (ref 5.0–8.0)

## 2020-04-08 MED ORDER — CIPROFLOXACIN HCL 500 MG PO TABS
500.0000 mg | ORAL_TABLET | Freq: Two times a day (BID) | ORAL | 0 refills | Status: DC
Start: 2020-04-08 — End: 2020-12-25

## 2020-04-08 NOTE — Progress Notes (Signed)
   Subjective:    Patient ID: Alison Cordova, female    DOB: [birth year], 86 y.o.   MRN: [id number]  HPI Here for 3 days of urinary urgency and burning. No back pain or fever. Drinking plenty of water.    Review of Systems  Constitutional: Negative.   Respiratory: Negative.   Cardiovascular: Negative.   Gastrointestinal: Negative.   Genitourinary: Positive for dysuria, frequency and urgency. Negative for flank pain and hematuria.       Objective:   Physical Exam Constitutional:      Appearance: Normal appearance. She is not ill-appearing.  Cardiovascular:     Rate and Rhythm: Normal rate and regular rhythm.     Pulses: Normal pulses.     Heart sounds: Normal heart sounds.  Pulmonary:     Effort: Pulmonary effort is normal.     Breath sounds: Normal breath sounds.  Abdominal:     General: Abdomen is flat. Bowel sounds are normal. There is no distension.     Palpations: Abdomen is soft. There is no mass.     Tenderness: There is no abdominal tenderness. There is no guarding or rebound.     Hernia: No hernia is present.  Neurological:     Mental Status: She is alert.           Assessment & Plan:  UTI, treat with Cipro. Culture the sample. Alysia Penna, MD

## 2020-04-08 NOTE — Telephone Encounter (Signed)
Looks like she has appointment right now with Dr. Sarajane Jews.

## 2020-04-08 NOTE — Telephone Encounter (Signed)
Pt has had a bladder infection since Saturday and is requesting something be called in to the pharmacy. Pt refused an appt and asked, for a message be sent to the doctor. Thanks

## 2020-04-10 LAB — URINE CULTURE
MICRO NUMBER:: 10485323
SPECIMEN QUALITY:: ADEQUATE

## 2020-05-07 ENCOUNTER — Encounter: Payer: Self-pay | Admitting: Family Medicine

## 2020-05-07 ENCOUNTER — Telehealth (INDEPENDENT_AMBULATORY_CARE_PROVIDER_SITE_OTHER): Payer: PPO | Admitting: Family Medicine

## 2020-05-07 VITALS — Ht 60.0 in | Wt 110.0 lb

## 2020-05-07 DIAGNOSIS — H9190 Unspecified hearing loss, unspecified ear: Secondary | ICD-10-CM | POA: Diagnosis not present

## 2020-05-07 DIAGNOSIS — I10 Essential (primary) hypertension: Secondary | ICD-10-CM | POA: Diagnosis not present

## 2020-05-07 DIAGNOSIS — N182 Chronic kidney disease, stage 2 (mild): Secondary | ICD-10-CM

## 2020-05-07 DIAGNOSIS — E039 Hypothyroidism, unspecified: Secondary | ICD-10-CM | POA: Diagnosis not present

## 2020-05-07 NOTE — Progress Notes (Signed)
Virtual Visit via Telephone Note  I connected with Alison Cordova on 05/07/20 at 10:00 AM EDT by telephone and verified that I am speaking with the correct person using two identifiers.   I discussed the limitations, risks, security and privacy concerns of performing an evaluation and management service by telephone and the availability of in person appointments. I also discussed with the patient that there may be a patient responsible charge related to this service. The patient expressed understanding and agreed to proceed.  Location patient: home, [place] Location provider: work or home office Participants present for the call: patient, provider, patient's daughter Patient did not have a visit in the prior 7 days to address this/these issue(s).   History of Present Illness:  Reports is doing well. BP this morning was 156/62. Daughter reports runs around 160/60-70s on average. Denies CP, fatigue, feeling unwell, swelling.  Occ issues with arthritis in the ankle -resolves with tylenol. Reports energy is good and UTI last month cleared up right away with treatment.  Taking BP meds as instructed. Reports mood is good. Lost one hearing aide and if cant find it they plan to go back to the audiologist to replace it.   Observations/Objective: Patient sounds cheerful and well on the phone. I do not appreciate any SOB. Speech and thought processing are grossly intact. Patient reported vitals:  Assessment and Plan:  Essential hypertension  Hypothyroidism, unspecified type  Chronic kidney disease (CKD), stage II (mild)  Hearing loss, unspecified hearing loss type, unspecified laterality  -monitor home BP daily for 1 week at different times of the day (but at least 1 hour after taking BP meds) - call results in in 1 week. Advised could increase norvasc to 7.5mg  or increase lisinopril if needed with close follow up to ensure tolerating and check labs. They agree. -replace hearing aid if cant find  it -cont current thyroid medication -follow up and labs in 3 months  Follow Up Instructions:   I did not refer this patient for an OV in the next 24 hours for this/these issue(s).  I discussed the assessment and treatment plan with the patient. The patient was provided an opportunity to ask questions and all were answered. The patient agreed with the plan and demonstrated an understanding of the instructions.   The patient was advised to call back or seek an in-person evaluation if the symptoms worsen or if the condition fails to improve as anticipated.  I provided 15 minutes of non-face-to-face time during this encounter.   Alison Kern, DO

## 2020-05-20 ENCOUNTER — Telehealth: Payer: Self-pay | Admitting: Family Medicine

## 2020-05-20 NOTE — Telephone Encounter (Signed)
Pt received a no show letter but she did have the visit with Alison Cordova on 6/15. Informed pt that I do see the appt summary and she will not be charged the no show fee

## 2020-06-05 DIAGNOSIS — H353132 Nonexudative age-related macular degeneration, bilateral, intermediate dry stage: Secondary | ICD-10-CM | POA: Diagnosis not present

## 2020-06-05 DIAGNOSIS — H35373 Puckering of macula, bilateral: Secondary | ICD-10-CM | POA: Diagnosis not present

## 2020-06-05 DIAGNOSIS — H34832 Tributary (branch) retinal vein occlusion, left eye, with macular edema: Secondary | ICD-10-CM | POA: Diagnosis not present

## 2020-06-05 DIAGNOSIS — H348312 Tributary (branch) retinal vein occlusion, right eye, stable: Secondary | ICD-10-CM | POA: Diagnosis not present

## 2020-08-01 DIAGNOSIS — N3946 Mixed incontinence: Secondary | ICD-10-CM | POA: Diagnosis not present

## 2020-08-01 DIAGNOSIS — R350 Frequency of micturition: Secondary | ICD-10-CM | POA: Diagnosis not present

## 2020-09-06 DIAGNOSIS — H35373 Puckering of macula, bilateral: Secondary | ICD-10-CM | POA: Diagnosis not present

## 2020-09-06 DIAGNOSIS — H348312 Tributary (branch) retinal vein occlusion, right eye, stable: Secondary | ICD-10-CM | POA: Diagnosis not present

## 2020-09-06 DIAGNOSIS — H34832 Tributary (branch) retinal vein occlusion, left eye, with macular edema: Secondary | ICD-10-CM | POA: Diagnosis not present

## 2020-09-06 DIAGNOSIS — H353112 Nonexudative age-related macular degeneration, right eye, intermediate dry stage: Secondary | ICD-10-CM | POA: Diagnosis not present

## 2020-11-07 ENCOUNTER — Other Ambulatory Visit: Payer: Self-pay | Admitting: *Deleted

## 2020-11-07 MED ORDER — AMLODIPINE BESYLATE 5 MG PO TABS: 5.0000 mg | ORAL_TABLET | Freq: Every day | ORAL | 0 refills | Status: DC

## 2020-11-07 NOTE — Telephone Encounter (Signed)
Rx done. 

## 2020-12-04 DIAGNOSIS — H353132 Nonexudative age-related macular degeneration, bilateral, intermediate dry stage: Secondary | ICD-10-CM | POA: Diagnosis not present

## 2020-12-04 DIAGNOSIS — H34832 Tributary (branch) retinal vein occlusion, left eye, with macular edema: Secondary | ICD-10-CM | POA: Diagnosis not present

## 2020-12-04 DIAGNOSIS — H35373 Puckering of macula, bilateral: Secondary | ICD-10-CM | POA: Diagnosis not present

## 2020-12-04 DIAGNOSIS — H43813 Vitreous degeneration, bilateral: Secondary | ICD-10-CM | POA: Diagnosis not present

## 2020-12-05 ENCOUNTER — Telehealth: Payer: Self-pay | Admitting: Family Medicine

## 2020-12-05 NOTE — Telephone Encounter (Signed)
Left message for patient to call back and schedule Medicare Annual Wellness Visit (AWV) either virtually or in office.   Last AWV 12/21/19  please schedule at anytime with [facility] Nurse Health Advisor 1 or 2   This should be a 45 minute visit. 

## 2020-12-05 NOTE — Telephone Encounter (Signed)
Pts daughter [name]) called and scheduled the appointment.

## 2020-12-13 DIAGNOSIS — N3946 Mixed incontinence: Secondary | ICD-10-CM | POA: Diagnosis not present

## 2020-12-13 DIAGNOSIS — R350 Frequency of micturition: Secondary | ICD-10-CM | POA: Diagnosis not present

## 2020-12-16 ENCOUNTER — Telehealth: Payer: Self-pay | Admitting: Family Medicine

## 2020-12-16 MED ORDER — ATENOLOL-CHLORTHALIDONE 50-25 MG PO TABS: 1.0000 | ORAL_TABLET | Freq: Every day | ORAL | 0 refills | Status: DC

## 2020-12-16 MED ORDER — LISINOPRIL 20 MG PO TABS: 20.0000 mg | ORAL_TABLET | Freq: Every day | ORAL | 0 refills | Status: DC

## 2020-12-16 MED ORDER — SIMVASTATIN 20 MG PO TABS: 20.0000 mg | ORAL_TABLET | Freq: Every day | ORAL | 0 refills | Status: DC

## 2020-12-16 NOTE — Addendum Note (Signed)
Addended by: Agnes Lawrence on: 12/16/2020 02:53 PM   Modules accepted: Orders

## 2020-12-16 NOTE — Telephone Encounter (Signed)
Per [name] at [facility] the Rxs are almost ready for pick up and the pt will be contacted.  Spoke with Ms Alison Cordova and informed her of this.

## 2020-12-16 NOTE — Telephone Encounter (Signed)
Rx done. 

## 2020-12-16 NOTE — Telephone Encounter (Signed)
Pt daughter call and stated she is out of  atenolol-chlorthalidone (TENORETIC) 50-25 MG tablet , lisinopril (ZESTRIL) 20 MG tablet and  simvastati sn (ZOCOR) 20 MG tablet and want refills sent to [id number] [address], [place] Phone:  [phone]  Fax:  [phone]

## 2020-12-16 NOTE — Telephone Encounter (Signed)
Patient's daughter states that the pharmacy advised her that her mother cannot refill her lisinopril (ZESTRIL) 20 MG tablet and  simvastati sn (ZOCOR) 20 MG tablet until April.  Please advise.

## 2020-12-16 NOTE — Addendum Note (Signed)
Addended by: Agnes Lawrence on: 12/16/2020 02:55 PM   Modules accepted: Orders

## 2020-12-25 ENCOUNTER — Ambulatory Visit (INDEPENDENT_AMBULATORY_CARE_PROVIDER_SITE_OTHER): Payer: PPO

## 2020-12-25 DIAGNOSIS — Z0000 Encounter for general adult medical examination without abnormal findings: Secondary | ICD-10-CM | POA: Diagnosis not present
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
## 2020-12-25 NOTE — Patient Instructions (Signed)
Alison Cordova , Thank you for taking time to come for your Medicare Wellness Visit. I appreciate your ongoing commitment to your health goals. Please review the following plan we discussed and let me know if I can assist you in the future.   Screening recommendations/referrals: Colonoscopy: No longer required Mammogram: Done 09/12/14 Bone Density: Done 02/21/14 Recommended yearly ophthalmology/optometry visit for glaucoma screening and checkup Recommended yearly dental visit for hygiene and checkup  Vaccinations: Influenza vaccine: Done 08/16/20 Pneumococcal vaccine: Up to date Tdap vaccine: Due and discussed Shingles vaccine: Shingrix discussed. Please contact your pharmacy for coverage information.    Covid-19:Completed 2/13 & 01/29/20  Advanced directives: Please bring a copy of your health care power of attorney and living will to the office at your convenience.  Conditions/risks identified: Get up everyday and stay healthy   Next appointment: Follow up in one year for your annual wellness visit    Preventive Care 65 Years and Older, Female Preventive care refers to lifestyle choices and visits with your health care provider that can promote health and wellness. What does preventive care include?  A yearly physical exam. This is also called an annual well check.  Dental exams once or twice a year.  Routine eye exams. Ask your health care provider how often you should have your eyes checked.  Personal lifestyle choices, including:  Daily care of your teeth and gums.  Regular physical activity.  Eating a healthy diet.  Avoiding tobacco and drug use.  Limiting alcohol use.  Practicing safe sex.  Taking low-dose aspirin every day.  Taking vitamin and mineral supplements as recommended by your health care provider. What happens during an annual well check? The services and screenings done by your health care provider during your annual well check will depend on your age,  overall health, lifestyle risk factors, and family history of disease. Counseling  Your health care provider may ask you questions about your:  Alcohol use.  Tobacco use.  Drug use.  Emotional well-being.  Home and relationship well-being.  Sexual activity.  Eating habits.  History of falls.  Memory and ability to understand (cognition).  Work and work Statistician.  Reproductive health. Screening  You may have the following tests or measurements:  Height, weight, and BMI.  Blood pressure.  Lipid and cholesterol levels. These may be checked every 5 years, or more frequently if you are over 14 years old.  Skin check.  Lung cancer screening. You may have this screening every year starting at age 30 if you have a 30-pack-year history of smoking and currently smoke or have quit within the past 15 years.  Fecal occult blood test (FOBT) of the stool. You may have this test every year starting at age 42.  Flexible sigmoidoscopy or colonoscopy. You may have a sigmoidoscopy every 5 years or a colonoscopy every 10 years starting at age 72.  Hepatitis C blood test.  Hepatitis B blood test.  Sexually transmitted disease (STD) testing.  Diabetes screening. This is done by checking your blood sugar (glucose) after you have not eaten for a while (fasting). You may have this done every 1-3 years.  Bone density scan. This is done to screen for osteoporosis. You may have this done starting at age 3.  Mammogram. This may be done every 1-2 years. Talk to your health care provider about how often you should have regular mammograms. Talk with your health care provider about your test results, treatment options, and if necessary, the need for  more tests. Vaccines  Your health care provider may recommend certain vaccines, such as:  Influenza vaccine. This is recommended every year.  Tetanus, diphtheria, and acellular pertussis (Tdap, Td) vaccine. You may need a Td booster every 10  years.  Zoster vaccine. You may need this after age 34.  Pneumococcal 13-valent conjugate (PCV13) vaccine. One dose is recommended after age 1.  Pneumococcal polysaccharide (PPSV23) vaccine. One dose is recommended after age 11. Talk to your health care provider about which screenings and vaccines you need and how often you need them. This information is not intended to replace advice given to you by your health care provider. Make sure you discuss any questions you have with your health care provider. Document Released: 12/06/2015 Document Revised: 07/29/2016 Document Reviewed: 09/10/2015 Elsevier Interactive Patient Education  2017 [employer] Prevention in the Home Falls can cause injuries. They can happen to people of all ages. There are many things you can do to make your home safe and to help prevent falls. What can I do on the outside of my home?  Regularly fix the edges of walkways and driveways and fix any cracks.  Remove anything that might make you trip as you walk through a door, such as a raised step or threshold.  Trim any bushes or trees on the path to your home.  Use bright outdoor lighting.  Clear any walking paths of anything that might make someone trip, such as rocks or tools.  Regularly check to see if handrails are loose or broken. Make sure that both sides of any steps have handrails.  Any raised decks and porches should have guardrails on the edges.  Have any leaves, snow, or ice cleared regularly.  Use sand or salt on walking paths during winter.  Clean up any spills in your garage right away. This includes oil or grease spills. What can I do in the bathroom?  Use night lights.  Install grab bars by the toilet and in the tub and shower. Do not use towel bars as grab bars.  Use non-skid mats or decals in the tub or shower.  If you need to sit down in the shower, use a plastic, non-slip stool.  Keep the floor dry. Clean up any water that  spills on the floor as soon as it happens.  Remove soap buildup in the tub or shower regularly.  Attach bath mats securely with double-sided non-slip rug tape.  Do not have throw rugs and other things on the floor that can make you trip. What can I do in the bedroom?  Use night lights.  Make sure that you have a light by your bed that is easy to reach.  Do not use any sheets or blankets that are too big for your bed. They should not hang down onto the floor.  Have a firm chair that has side arms. You can use this for support while you get dressed.  Do not have throw rugs and other things on the floor that can make you trip. What can I do in the kitchen?  Clean up any spills right away.  Avoid walking on wet floors.  Keep items that you use a lot in easy-to-reach places.  If you need to reach something above you, use a strong step stool that has a grab bar.  Keep electrical cords out of the way.  Do not use floor polish or wax that makes floors slippery. If you must use wax, use non-skid  floor wax.  Do not have throw rugs and other things on the floor that can make you trip. What can I do with my stairs?  Do not leave any items on the stairs.  Make sure that there are handrails on both sides of the stairs and use them. Fix handrails that are broken or loose. Make sure that handrails are as long as the stairways.  Check any carpeting to make sure that it is firmly attached to the stairs. Fix any carpet that is loose or worn.  Avoid having throw rugs at the top or bottom of the stairs. If you do have throw rugs, attach them to the floor with carpet tape.  Make sure that you have a light switch at the top of the stairs and the bottom of the stairs. If you do not have them, ask someone to add them for you. What else can I do to help prevent falls?  Wear shoes that:  Do not have high heels.  Have rubber bottoms.  Are comfortable and fit you well.  Are closed at the  toe. Do not wear sandals.  If you use a stepladder:  Make sure that it is fully opened. Do not climb a closed stepladder.  Make sure that both sides of the stepladder are locked into place.  Ask someone to hold it for you, if possible.  Clearly mark and make sure that you can see:  Any grab bars or handrails.  First and last steps.  Where the edge of each step is.  Use tools that help you move around (mobility aids) if they are needed. These include:  Canes.  Walkers.  Scooters.  Crutches.  Turn on the lights when you go into a dark area. Replace any light bulbs as soon as they burn out.  Set up your furniture so you have a clear path. Avoid moving your furniture around.  If any of your floors are uneven, fix them.  If there are any pets around you, be aware of where they are.  Review your medicines with your doctor. Some medicines can make you feel dizzy. This can increase your chance of falling. Ask your doctor what other things that you can do to help prevent falls. This information is not intended to replace advice given to you by your health care provider. Make sure you discuss any questions you have with your health care provider. Document Released: 09/05/2009 Document Revised: 04/16/2016 Document Reviewed: 12/14/2014 Elsevier Interactive Patient Education  2017 [employer].

## 2021-03-04 DIAGNOSIS — H34832 Tributary (branch) retinal vein occlusion, left eye, with macular edema: Secondary | ICD-10-CM | POA: Diagnosis not present

## 2021-03-04 DIAGNOSIS — H348312 Tributary (branch) retinal vein occlusion, right eye, stable: Secondary | ICD-10-CM | POA: Diagnosis not present

## 2021-03-04 DIAGNOSIS — H35373 Puckering of macula, bilateral: Secondary | ICD-10-CM | POA: Diagnosis not present

## 2021-03-04 DIAGNOSIS — H43813 Vitreous degeneration, bilateral: Secondary | ICD-10-CM | POA: Diagnosis not present

## 2021-03-20 ENCOUNTER — Other Ambulatory Visit: Payer: Self-pay | Admitting: *Deleted

## 2021-03-20 MED ORDER — LEVOTHYROXINE SODIUM 50 MCG PO TABS: 50.0000 ug | ORAL_TABLET | Freq: Every day | ORAL | 0 refills | Status: DC

## 2021-03-20 NOTE — Telephone Encounter (Signed)
Rx done. 

## 2021-03-21 ENCOUNTER — Telehealth: Payer: Self-pay | Admitting: Family Medicine

## 2021-03-21 NOTE — Telephone Encounter (Signed)
Left a detailed message at Susan's cell number below stating the Rx was sent to [facility] yesterday and to call back with any further questions.

## 2021-03-21 NOTE — Telephone Encounter (Signed)
levothyroxine (SYNTHROID) 50 MCG tablet  [facility]bal[facility]) - [place], [place] Phone:  [phone]  Fax:  [phone]     [name] would also like for JoAnne to call her at [phone]

## 2021-04-09 DIAGNOSIS — N3946 Mixed incontinence: Secondary | ICD-10-CM | POA: Diagnosis not present

## 2021-04-09 DIAGNOSIS — R350 Frequency of micturition: Secondary | ICD-10-CM | POA: Diagnosis not present

## 2021-05-08 ENCOUNTER — Other Ambulatory Visit: Payer: Self-pay | Admitting: *Deleted

## 2021-05-08 MED ORDER — AMLODIPINE BESYLATE 5 MG PO TABS: 5.0000 mg | ORAL_TABLET | Freq: Every day | ORAL | 0 refills | Status: DC

## 2021-05-20 ENCOUNTER — Other Ambulatory Visit: Payer: Self-pay | Admitting: Family Medicine

## 2021-06-02 ENCOUNTER — Other Ambulatory Visit: Payer: Self-pay | Admitting: *Deleted

## 2021-06-02 MED ORDER — LEVOTHYROXINE SODIUM 50 MCG PO TABS: 50.0000 ug | ORAL_TABLET | Freq: Every day | ORAL | 0 refills | Status: DC

## 2021-06-02 NOTE — Telephone Encounter (Signed)
Rx done. 

## 2021-06-03 ENCOUNTER — Encounter: Payer: Self-pay | Admitting: Family Medicine

## 2021-06-03 ENCOUNTER — Telehealth (INDEPENDENT_AMBULATORY_CARE_PROVIDER_SITE_OTHER): Payer: PPO | Admitting: Family Medicine

## 2021-06-03 ENCOUNTER — Other Ambulatory Visit: Payer: Self-pay

## 2021-06-03 ENCOUNTER — Telehealth: Payer: PPO | Admitting: Family Medicine

## 2021-06-03 VITALS — BP 164/71 | Temp 98.9°F

## 2021-06-03 DIAGNOSIS — R059 Cough, unspecified: Secondary | ICD-10-CM

## 2021-06-03 DIAGNOSIS — I10 Essential (primary) hypertension: Secondary | ICD-10-CM

## 2021-06-03 DIAGNOSIS — R0981 Nasal congestion: Secondary | ICD-10-CM | POA: Diagnosis not present

## 2021-06-03 MED ORDER — BENZONATATE 100 MG PO CAPS: 100.0000 mg | ORAL_CAPSULE | Freq: Three times a day (TID) | ORAL | 0 refills | Status: DC | PRN

## 2021-06-03 MED ORDER — MOLNUPIRAVIR EUA 200MG CAPSULE: 4.0000 | ORAL_CAPSULE | Freq: Two times a day (BID) | ORAL | 0 refills | Status: AC

## 2021-06-03 NOTE — Addendum Note (Signed)
Addended by: Lucretia Kern on: 06/03/2021 06:27 PM   Modules accepted: Orders

## 2021-06-03 NOTE — Patient Instructions (Addendum)
HOME CARE TIPS:  -[facility] testing information: [email or web address]-[email or web address] OR [phone] Most pharmacies also offer testing and home test kits. If the Covid19 test is positive, please make a prompt follow up visit with your primary care office or with [facility] to discuss treatment options. Treatments for Covid19 are best given early in the course of the illness.   -I sent the medication(s) we discussed to your pharmacy: Meds ordered this encounter  Medications   benzonatate (TESSALON PERLES) 100 MG capsule    Sig: Take 1 capsule (100 mg total) by mouth 3 (three) times daily as needed.    Dispense:  20 capsule    Refill:  0     -can use tylenol if needed for fevers, aches and pains per instructions  -can use nasal saline a few times per day if you have nasal congestion; sometimes  a short course of Afrin nasal spray for 3 days can help with symptoms as well  -stay hydrated, drink plenty of fluids and eat small healthy meals - avoid dairy  -can take 1000 IU (45mg) Vit D3 and 100-500 mg of Vit C daily per instructions  -If the Covid test is positive, check out the [facility] for more information on home care, transmission and treatment for COVID19  -follow up with your doctor in 2-3 days unless improving and feeling better  -stay home while sick, except to seek medical care. If you have COVID19, ideally it would be best to stay home for a full 10 days since the onset of symptoms PLUS one day of no fever and feeling better. Wear a good mask that fits snugly (such as N95 or KN95) if around others to reduce the risk of transmission.  It was nice to meet you today, and I really hope you are feeling better soon. I help [place] [facility] out with telemedicine visits on Tuesdays and Thursdays and am available for visits on those days. If you have any concerns or questions following this visit please schedule a follow up visit with your Primary Care doctor  or seek care at a local urgent care clinic to avoid delays in care.    Seek in person care or schedule a follow up video visit promptly if your symptoms worsen, new concerns arise or you are not improving with treatment. Call 911 and/or seek emergency care if your symptoms are severe or life threatening.     Fact Sheet for Patients And Caregivers Emergency Use Authorization (EUA) Of LAGEVRIOT (molnupiravir) capsules For Coronavirus Disease 2019 (COVID-19)  What is the most important information I should know about LAGEVRIO? LAGEVRIO may cause serious side effects, including: ? LAGEVRIO may cause harm to your unborn baby. It is not known if LAGEVRIO will harm your baby if you take LAGEVRIO during pregnancy. o LAGEVRIO is not recommended for use in pregnancy. o LAGEVRIO has not been studied in pregnancy. LAGEVRIO was studied in pregnant animals only. When LAGEVRIO was given to pregnant animals, LAGEVRIO caused harm to their unborn babies. o You and your healthcare provider may decide that you should take LAGEVRIO during pregnancy if there are no other COVID-19 treatment options approved or authorized by the FDA that are accessible or clinically appropriate for you. o If you and your healthcare provider decide that you should take LAGEVRIO during pregnancy, you and your healthcare provider should discuss the known and potential benefits and the potential risks of taking LAGEVRIO during pregnancy. For individuals who are able to become pregnant: ? You should use a  reliable method of birth control (contraception) consistently and correctly during treatment with LAGEVRIO and for 4 days after the last dose of LAGEVRIO. Talk to your healthcare provider about reliable birth control methods. ? Before starting treatment with [facility] your healthcare provider may do a pregnancy test to see if you are pregnant before starting treatment with LAGEVRIO. ? Tell your healthcare provider right away if  you become pregnant or think you may be pregnant during treatment with LAGEVRIO. Pregnancy Surveillance Program: ? There is a pregnancy surveillance program for individuals who take LAGEVRIO during pregnancy. The purpose of this program is to collect information about the health of you and your baby. Talk to your healthcare provider about how to take part in this program. ? If you take LAGEVRIO during pregnancy and you agree to participate in the pregnancy surveillance program and allow your healthcare provider to share your information with [place], then your healthcare provider will report your use of [place] during pregnancy to [place]. by calling [phone] or [email or web address]. For individuals who are sexually active with partners who are able to become pregnant: ? It is not known if LAGEVRIO can affect sperm. While the risk is regarded as low, animal studies to fully assess the potential for LAGEVRIO to affect the babies of males treated with LAGEVRIO have not been completed. A reliable method of birth control (contraception) should be used consistently and correctly during treatment with LAGEVRIO and for at least 3 months after the last dose. The risk to sperm beyond 3 months is not known. Studies to understand the risk to sperm beyond 3 months are ongoing. Talk to your healthcare provider about reliable birth control methods. Talk to your healthcare provider if you have questions or concerns about how LAGEVRIO may affect sperm. You are being given this fact sheet because your healthcare provider believes it is necessary to provide you with LAGEVRIO for the treatment of adults with mild-to-moderate coronavirus disease 2019 (COVID-19) with positive results of direct SARS-CoV-2 viral testing, and who are at high risk for progression to severe COVID-19 including hospitalization or death, and for whom other COVID-19 treatment options approved or  authorized by the FDA are not accessible or clinically appropriate. The U.S. Food and Drug Administration (FDA) has issued an Emergency Use Authorization (EUA) to make LAGEVRIO available during the COVID-19 pandemic (for more details about an EUA please see "What is an Emergency Use Authorization?" at the end of this document). LAGEVRIO is not an FDA-approved medicine in the [place]. Read this Fact Sheet for information about LAGEVRIO. Talk to your healthcare provider about your options if you have any questions. It is your choice to take LAGEVRIO.  What is COVID-19? COVID-19 is caused by a virus called a coronavirus. You can get COVID-19 through close contact with another person who has the virus. COVID-19 illnesses have ranged from very mild-to-severe, including illness resulting in death. While information so far suggests that most COVID-19 illness is mild, serious illness can happen and may cause some of your other medical conditions to become worse. Older people and people of all ages with severe, long lasting (chronic) medical conditions like heart disease, lung disease and diabetes, for example seem to be at higher risk of being hospitalized for COVID-19.  What is LAGEVRIO? LAGEVRIO is an investigational medicine used to treat mild-to-moderate COVID-19 in adults: ? with positive results of direct SARS-CoV-2 viral testing, and ? who are at high risk for progression to severe  COVID-19 including hospitalization or death, and for whom other COVID-19 treatment options approved or authorized by the FDA are not accessible or clinically appropriate. The FDA has authorized the emergency use of LAGEVRIO for the treatment of mild-tomoderate COVID-19 in adults under an EUA. For more information on EUA, see the "What is an Emergency Use Authorization (EUA)?" section at the end of this Fact Sheet. LAGEVRIO is not authorized: ? for use in people less than 23 years of age. ? for prevention  of COVID-19. ? for people needing hospitalization for COVID-19. ? for use for longer than 5 consecutive days.  What should I tell my healthcare provider before I take LAGEVRIO? Tell your healthcare provider if you: ? Have any allergies ? Are breastfeeding or plan to breastfeed ? Have any serious illnesses ? Are taking any medicines (prescription, over-the-counter, vitamins, or herbal products).  How do I take LAGEVRIO? ? Take LAGEVRIO exactly as your healthcare provider tells you to take it. ? Take 4 capsules of LAGEVRIO every 12 hours (for example, at 8 am and at 8 pm) ? Take LAGEVRIO for 5 days. It is important that you complete the full 5 days of treatment with LAGEVRIO. Do not stop taking LAGEVRIO before you complete the full 5 days of treatment, even if you feel better. ? Take LAGEVRIO with or without food. ? You should stay in isolation for as long as your healthcare provider tells you to. Talk to your healthcare provider if you are not sure about how to properly isolate while you have COVID-19. ? Swallow LAGEVRIO capsules whole. Do not open, break, or crush the capsules. If you cannot swallow capsules whole, tell your healthcare provider. ? What to do if you miss a dose: o If it has been less than 10 hours since the missed dose, take it as soon as you remember o If it has been more than 10 hours since the missed dose, skip the missed dose and take your dose at the next scheduled time. ? Do not double the dose of LAGEVRIO to make up for a missed dose.  What are the important possible side effects of LAGEVRIO? ? See, "What is the most important information I should know about LAGEVRIO?" ? Allergic Reactions. Allergic reactions can happen in people taking LAGEVRIO, even after only 1 dose. Stop taking LAGEVRIO and call your healthcare provider right away if you get any of the following symptoms of an allergic reaction: o hives o rapid heartbeat o trouble swallowing or  breathing o swelling of the mouth, lips, or face o throat tightness o hoarseness o skin rash The most common side effects of LAGEVRIO are: ? diarrhea ? nausea ? dizziness These are not all the possible side effects of LAGEVRIO. Not many people have taken LAGEVRIO. Serious and unexpected side effects may happen. This medicine is still being studied, so it is possible that all of the risks are not known at this time.  What other treatment choices are there?  Veklury (remdesivir) is FDA-approved as an intravenous (IV) infusion for the treatment of mildto-moderate [id number] in certain adults and children. Talk with your doctor to see if Marijean Heath is appropriate for you. Like LAGEVRIO, FDA may also allow for the emergency use of other medicines to treat people with COVID-19. Go to LacrosseProperties.[email or web address] for more information. It is your choice to be treated or not to be treated with LAGEVRIO. Should you decide not to take it, it will not change your standard medical care.  What  if I am breastfeeding? Breastfeeding is not recommended during treatment with LAGEVRIO and for 4 days after the last dose of LAGEVRIO. If you are breastfeeding or plan to breastfeed, talk to your healthcare provider about your options and specific situation before taking LAGEVRIO.  How do I report side effects with LAGEVRIO? Contact your healthcare provider if you have any side effects that bother you or do not go away. Report side effects to FDA MedWatch at SmoothHits.hu or call 1-800-FDA-1088 (1- [phone]) [phone]).  How should I store [place]? ? Store LAGEVRIO capsules at room temperature between 78F to 44F (20C to 25C). ? Keep LAGEVRIO and all medicines out of the reach of children and pets. How can I learn more about COVID-19? ? Ask your healthcare provider. ? Visit SeekRooms.co.uk ? Contact your  local or state public health department. ? Call [place] at [phone] (toll free in the [place] ? Visit www.molnupiravir.com  What Is an Emergency Use Authorization (EUA)? The [place] FDA has made [place] available under an emergency access mechanism called an Emergency Use Authorization (EUA) The EUA is supported by a [employer], broadcasting Health and Human Service (HHS) declaration that circumstances exist to justify emergency use of drugs and biological products during the COVID-19 pandemic. LAGEVRIO for the treatment of mild-to-moderate COVID-19 in adults with positive results of direct SARS-CoV-2 viral testing, who are at high risk for progression to severe COVID-19, including hospitalization or death, and for whom alternative COVID-19 treatment options approved or authorized by FDA are not accessible or clinically appropriate, has not undergone the same type of review as an FDA-approved product. In issuing an EUA under the NUUVO-53 public health emergency, the FDA has determined, among other things, that based on the total amount of scientific evidence available including data from adequate and well-controlled clinical trials, if available, it is reasonable to believe that the product may be effective for diagnosing, treating, or preventing COVID-19, or a serious or life-threatening disease or condition caused by COVID-19; that the known and potential benefits of the product, when used to diagnose, treat, or prevent such disease or condition, outweigh the known and potential risks of such product; and that there are no adequate, approved, and available alternatives.  All of these criteria must be met to allow for the product to be used in the treatment of patients during the COVID-19 pandemic. The EUA for LAGEVRIO is in effect for the duration of the COVID-19 declaration justifying emergency use of LAGEVRIO, unless terminated or revoked (after which LAGEVRIO may no longer be  used under the EUA). For patent information: http://rogers.info/ Copyright  2021-2022 [employer] [place], [place] and its affiliates. All rights reserved. [id number] Revised: March 2022

## 2021-06-03 NOTE — Progress Notes (Addendum)
Virtual Visit via Telephone Note  I connected with SHAYLI ALTEMOSE on 06/03/21 at 10:00 AM EDT by telephone and verified that I am speaking with the correct person using two identifiers.   I discussed the limitations, risks, security and privacy concerns of performing an evaluation and management service by telephone and the availability of in person appointments. I also discussed with the patient that there may be a patient responsible charge related to this service. The patient expressed understanding and agreed to proceed.  Location patient: home, [place] Location provider: work or home office Participants present for the call: patient, provider, patient's daughter Patient did not have a visit with me in the prior 7 days to address this/these issue(s).   History of Present Illness:  Acute telemedicine visit for scratchy throat: -Onset: 4 days ago -home covid test was negative yesterday -Symptoms include: sinus congestion, HA, scratchy throat, cough -now feels like she is doing a little better today -Denies:fevers, CP, SOB, NVD, inability to eat/drink/get out of bed -sick contacts - was around granddaughter who was covid + about 5-7 days before got sick and pts whole household is sick with these symptoms currently - but they all tested neg -Has tried: OTC cough and cold medication -Pertinent past medical history: see below -Pertinent medication allergies: No Known Allergies -COVID-19 vaccine status: had two doses and 2 booster   Past Medical History:  Diagnosis Date   Allergic rhinitis    Anemia 06/17/2012   Anxiety and depression    Cataract 06/17/2012   Chronic kidney disease    CVA (cerebral vascular accident) ([place])    Diverticulosis 03/14/2008   Qualifier: Diagnosis of  By: Lenna Gilford MD, Deborra Medina    Glaucoma    Hemorrhoids 07/03/2011   Hx of breast cancer    Left   Hyperlipemia    Hyperlipidemia    Hypertension    Hypothyroid    Irritable bowel syndrome 09/17/2007   Qualifier:  Diagnosis of  By: Rosana Hoes CMA, Tammy     Lung nodule 07/06/2011   Peripheral neuropathy    Urinary incontinence    Observations/Objective: Patient sounds cheerful and well on the phone. I do not appreciate any SOB. Speech and thought processing are grossly intact. Patient reported vitals:  Assessment and Plan:  Nasal congestion  Cough  Hypertension, unspecified type  -we discussed possible serious and likely etiologies, options for evaluation and workup, limitations of telemedicine visit vs in person visit, treatment, treatment risks and precautions. Pt prefers to treat via telemedicine empirically rather than in person at this moment.  Query COVID-19 with false negative testing, viral upper respiratory illness versus other.  They opted to retest today with a COVID test, and contact the office for treatment with molnupiravir if positive. Discussed EUA/risks/benefit/etc.  Discussed options for treatment and they prefer this.  Advised I will be on shift for [place] today, so they would need to call back today or otherwise schedule another appointment.  Sent Tessalon for cough.  Also discussed other symptomatic care measures. Blood pressure is a little elevated.  Could be from the over-the-counter cough and cold medications.  They agree to hold these and monitor blood pressure.  They agreed to monitor blood pressure at home and contact PCP office if continues to run high. Scheduled follow up with PCP offered: They report they have a follow-up with PCP in a few weeks. Advised to seek prompt in person care if worsening, new symptoms arise, or if is not improving with treatment.  Advised of options for inperson care in case PCP office not available. Did let the patient know that I only do telemedicine shifts for [place] on Tuesdays and Thursdays and advised a follow up visit with PCP or at an [facility] if has further questions or concerns.   Follow Up Instructions:  I did not refer this patient for an  OV with me in the next 24 hours for this/these issue(s).  I discussed the assessment and treatment plan with the patient. The patient was provided an opportunity to ask questions and all were answered. The patient agreed with the plan and demonstrated an understanding of the instructions.   I spent 16 minutes on the date of this visit in the care of this patient. See summary of tasks completed to properly care for this patient in the detailed notes above which also included counseling of above, review of PMH, medications, allergies, evaluation of the patient and ordering and/or  instructing patient on testing and care options.    Received call patient tested positive for covid. Called back and spoke with caregiver and the patient and the whole household is positive. Patient wants to do the molnupiravir. Rx sent.     Lucretia Kern, DO

## 2021-06-20 ENCOUNTER — Other Ambulatory Visit: Payer: Self-pay | Admitting: Family Medicine

## 2021-07-14 ENCOUNTER — Other Ambulatory Visit: Payer: PPO

## 2021-07-14 ENCOUNTER — Encounter: Payer: Self-pay | Admitting: Family Medicine

## 2021-07-14 ENCOUNTER — Ambulatory Visit (INDEPENDENT_AMBULATORY_CARE_PROVIDER_SITE_OTHER): Payer: PPO | Admitting: Family Medicine

## 2021-07-14 ENCOUNTER — Other Ambulatory Visit: Payer: Self-pay

## 2021-07-14 VITALS — BP 130/60 | HR 57 | Temp 98.0°F | Ht 60.0 in | Wt 106.6 lb

## 2021-07-14 DIAGNOSIS — E039 Hypothyroidism, unspecified: Secondary | ICD-10-CM

## 2021-07-14 DIAGNOSIS — I10 Essential (primary) hypertension: Secondary | ICD-10-CM

## 2021-07-14 DIAGNOSIS — R0981 Nasal congestion: Secondary | ICD-10-CM | POA: Diagnosis not present

## 2021-07-14 DIAGNOSIS — E785 Hyperlipidemia, unspecified: Secondary | ICD-10-CM

## 2021-07-14 DIAGNOSIS — N182 Chronic kidney disease, stage 2 (mild): Secondary | ICD-10-CM | POA: Diagnosis not present

## 2021-07-14 LAB — CBC WITH DIFFERENTIAL/PLATELET
Basophils Absolute: 0 10*3/uL (ref 0.0–0.1)
Basophils Relative: 0.4 % (ref 0.0–3.0)
Eosinophils Absolute: 0 10*3/uL (ref 0.0–0.7)
Eosinophils Relative: 0.6 % (ref 0.0–5.0)
HCT: 38.6 % (ref 36.0–46.0)
Hemoglobin: 12.6 g/dL (ref 12.0–15.0)
Lymphocytes Relative: 22.9 % (ref 12.0–46.0)
Lymphs Abs: 1.4 10*3/uL (ref 0.7–4.0)
MCHC: 32.7 g/dL (ref 30.0–36.0)
MCV: 89.1 fl (ref 78.0–100.0)
Monocytes Absolute: 1 10*3/uL (ref 0.1–1.0)
Monocytes Relative: 16.7 % — ABNORMAL HIGH (ref 3.0–12.0)
Neutro Abs: 3.6 10*3/uL (ref 1.4–7.7)
Neutrophils Relative %: 59.4 % (ref 43.0–77.0)
Platelets: 245 10*3/uL (ref 150.0–400.0)
RBC: 4.33 Mil/uL (ref 3.87–5.11)
RDW: 14.1 % (ref 11.5–15.5)
WBC: 6.1 10*3/uL (ref 4.0–10.5)

## 2021-07-14 LAB — LIPID PANEL
Cholesterol: 156 mg/dL (ref 0–200)
HDL: 53.3 mg/dL (ref >39.00)
LDL Cholesterol: 72 mg/dL (ref 0–99)
NonHDL: 102.66
Total CHOL/HDL Ratio: 3
Triglycerides: 155 mg/dL — ABNORMAL HIGH (ref 0.0–149.0)
VLDL: 31 mg/dL (ref 0.0–40.0)

## 2021-07-14 LAB — COMPREHENSIVE METABOLIC PANEL
ALT: 8 U/L (ref 0–35)
AST: 13 U/L (ref 0–37)
Albumin: 4.3 g/dL (ref 3.5–5.2)
Alkaline Phosphatase: 52 U/L (ref 39–117)
BUN: 23 mg/dL (ref 6–23)
CO2: 27 mEq/L (ref 19–32)
Calcium: 9.9 mg/dL (ref 8.4–10.5)
Chloride: 101 mEq/L (ref 96–112)
Creatinine, Ser: 1.4 mg/dL — ABNORMAL HIGH (ref 0.40–1.20)
GFR: 32.57 mL/min — ABNORMAL LOW (ref >60.00)
Glucose, Bld: 97 mg/dL (ref 70–99)
Potassium: 4.1 mEq/L (ref 3.5–5.1)
Sodium: 141 mEq/L (ref 135–145)
Total Bilirubin: 0.6 mg/dL (ref 0.2–1.2)
Total Protein: 7.6 g/dL (ref 6.0–8.3)

## 2021-07-14 LAB — TSH: TSH: 3.15 u[IU]/mL (ref 0.35–5.50)

## 2021-07-14 MED ORDER — AMLODIPINE BESYLATE 5 MG PO TABS: 5.0000 mg | ORAL_TABLET | Freq: Every day | ORAL | 3 refills | Status: DC

## 2021-07-14 MED ORDER — ATENOLOL-CHLORTHALIDONE 50-25 MG PO TABS: 1.0000 | ORAL_TABLET | Freq: Every day | ORAL | 3 refills | Status: DC

## 2021-07-14 MED ORDER — LEVOTHYROXINE SODIUM 50 MCG PO TABS: 50.0000 ug | ORAL_TABLET | Freq: Every day | ORAL | 3 refills | Status: DC

## 2021-07-14 MED ORDER — LISINOPRIL 20 MG PO TABS: 20.0000 mg | ORAL_TABLET | Freq: Every day | ORAL | 3 refills | Status: DC

## 2021-07-14 MED ORDER — IPRATROPIUM BROMIDE 0.03 % NA SOLN: 2.0000 | Freq: Two times a day (BID) | NASAL | 12 refills | Status: DC

## 2021-07-14 NOTE — Progress Notes (Signed)
Alison Cordova DOB: 08/21/1926 Encounter date: 07/14/2021  This is a 87 y.o. female who presents with Chief Complaint  Patient presents with   Follow-up   Sinus Problem    Patient complains of recurrent sinus drainage, questioned what medication can be used    History of present illness:  Here today with daughter. "I'm just old". Still has some head pain back of head. Same as it was 05/2019 when we last had a visit. Takes tylenol for back and for head and this works.   Doesn't use the flonase very often. Has had more sinus drainage.   HTN: has been doing ok at home. Highest pressure at home [id number] systolic; diastolic stays pretty low maybe 90 is highest that has been seen.   Hypothyroid: Continues with Synthroid 50 mcg daily  Incontinence: follows regularly with urology; myrbetriq three days/week  Needs reminders to use cane. Does have walker - uses this if out at [place].   No Known Allergies Current Meds  Medication Sig   acetaminophen (TYLENOL) 650 MG CR tablet Take 650 mg by mouth every 8 (eight) hours as needed.   aspirin 325 MG tablet Take 325 mg by mouth daily.   benzonatate (TESSALON PERLES) 100 MG capsule Take 1 capsule (100 mg total) by mouth 3 (three) times daily as needed.   cetirizine (ZYRTEC) 10 MG tablet Take 1 tablet (10 mg total) by mouth daily.   FLUAD QUADRIVALENT 0.5 ML injection    latanoprost (XALATAN) 0.005 % ophthalmic solution Place 1 drop into both eyes at bedtime.   mirabegron ER (MYRBETRIQ) 50 MG TB24 tablet Take 50 mg by mouth 3 (three) times a week.   Multiple Vitamins-Minerals (PRESERVISION AREDS PO) Take by mouth daily.   Multiple Vitamins-Minerals (WOMENS MULTIVITAMIN PLUS PO) Take 1 tablet by mouth daily.   NON FORMULARY Post Surgical Bras   NON FORMULARY Non-Silicone Breast Prosthesis   NON FORMULARY Silicone Breast Prosthesis   simvastatin (ZOCOR) 20 MG tablet TAKE 1 TABLET BY MOUTH EVERYDAY AT BEDTIME   timolol (BETIMOL) 0.5 % ophthalmic  solution Place 1 drop into both eyes 2 (two) times daily.   tobramycin (TOBREX) 0.3 % ophthalmic ointment Place 1 application into the left eye as needed.   UNABLE TO FIND 174.9 Left mastectomy   L8030-Silicone Breast Prosthesis-1   [DISCONTINUED] amLODipine (NORVASC) 5 MG tablet Take 1 tablet (5 mg total) by mouth daily.   [DISCONTINUED] atenolol-chlorthalidone (TENORETIC) 50-25 MG tablet Take 1 tablet by mouth daily.   [DISCONTINUED] ipratropium (ATROVENT) 0.03 % nasal spray Place 2 sprays into both nostrils every 12 (twelve) hours.   [DISCONTINUED] levothyroxine (SYNTHROID) 50 MCG tablet Take 1 tablet (50 mcg total) by mouth daily.   [DISCONTINUED] lisinopril (ZESTRIL) 20 MG tablet TAKE 1 TABLET BY MOUTH EVERY DAY    Review of Systems  Constitutional:  Negative for chills, fatigue and fever.  Respiratory:  Negative for cough, chest tightness, shortness of breath and wheezing.   Cardiovascular:  Negative for chest pain, palpitations and leg swelling.  Musculoskeletal:  Positive for neck pain.  Neurological:  Positive for headaches (mild, see hpi). Negative for dizziness and light-headedness.   Objective:  BP 130/60 (BP Location: Right Arm, Patient Position: Sitting, Cuff Size: Normal)   Pulse (!) 57   Temp 98 F (36.7 C) (Rectal)   Ht 5' (1.524 m)   Wt 106 lb 9.6 oz (48.4 kg)   SpO2 94%   BMI 20.82 kg/m   Weight: 106 lb 9.6 oz (  48.4 kg)   BP Readings from Last 3 Encounters:  07/14/21 130/60  06/03/21 (!) 164/71  04/08/20 118/60   Wt Readings from Last 3 Encounters:  07/14/21 106 lb 9.6 oz (48.4 kg)  05/07/20 110 lb (49.9 kg)  04/08/20 110 lb 12.8 oz (50.3 kg)    Physical Exam Constitutional:      General: She is not in acute distress.    Appearance: She is well-developed.  Cardiovascular:     Rate and Rhythm: Normal rate and regular rhythm.     Heart sounds: Normal heart sounds. No murmur heard.   No friction rub.  Pulmonary:     Effort: Pulmonary effort is  normal. No respiratory distress.     Breath sounds: Normal breath sounds. No wheezing or rales.  Musculoskeletal:     Right lower leg: No edema.     Left lower leg: No edema.  Neurological:     Mental Status: She is alert and oriented to person, place, and time.  Psychiatric:        Behavior: Behavior normal.    Assessment/Plan  1. Essential hypertension Encouraged monitoring of blood pressures at home.  Let me know if regularly running 130/65 or lower.  We discussed consideration for decreasing medications to prevent hypotension.  For now, continue amlodipine 5 mg daily, Tenoretic 50-25 mg, lisinopril 20 mg. - CBC with Differential/Platelet; Future - Comprehensive metabolic panel; Future - CBC with Differential/Platelet - Comprehensive metabolic panel  2. Hypothyroidism, unspecified type Continue Synthroid 50 mcg.  Check blood work today. - TSH; Future - TSH  3. Chronic kidney disease (CKD), stage II (mild) Kidney function has been stable.  Recheck blood work today.  4. Hyperlipidemia, unspecified hyperlipidemia type Continue Zocor 20 mg daily. - Lipid panel; Future - Lipid panel  5. Nasal congestion Suggested using Flonase daily to help prevent congestion and to keep drainage pathway open.  We are also going to try Atrovent nasal spray to see if this gets added help with nasal congestion/nasal drip.  Return in about 6 months (around 01/14/2022).     Micheline Rough, MD

## 2021-07-14 NOTE — Patient Instructions (Signed)
Watch home blood pressures - let me know if regularly running 130/65 or lower

## 2021-07-16 ENCOUNTER — Telehealth: Payer: Self-pay | Admitting: Family Medicine

## 2021-07-16 DIAGNOSIS — R0981 Nasal congestion: Secondary | ICD-10-CM

## 2021-07-16 MED ORDER — IPRATROPIUM BROMIDE 0.03 % NA SOLN: 2.0000 | Freq: Two times a day (BID) | NASAL | 1 refills | Status: DC

## 2021-07-16 NOTE — Telephone Encounter (Signed)
Rx sent via escribe.

## 2021-07-16 NOTE — Telephone Encounter (Signed)
Called to ask for authorization on two bottles or 32m of the ipratropium (ATROVENT) 0.03 % nasal spray  for the patient      Good callback is [phone] reference number is [id number]  Please Advise

## 2021-07-23 DIAGNOSIS — H34832 Tributary (branch) retinal vein occlusion, left eye, with macular edema: Secondary | ICD-10-CM | POA: Diagnosis not present

## 2021-07-23 DIAGNOSIS — H43813 Vitreous degeneration, bilateral: Secondary | ICD-10-CM | POA: Diagnosis not present

## 2021-07-23 DIAGNOSIS — H35373 Puckering of macula, bilateral: Secondary | ICD-10-CM | POA: Diagnosis not present

## 2021-07-23 DIAGNOSIS — H348312 Tributary (branch) retinal vein occlusion, right eye, stable: Secondary | ICD-10-CM | POA: Diagnosis not present

## 2021-08-14 ENCOUNTER — Telehealth: Payer: Self-pay | Admitting: Family Medicine

## 2021-08-14 DIAGNOSIS — N3946 Mixed incontinence: Secondary | ICD-10-CM | POA: Diagnosis not present

## 2021-08-14 NOTE — Chronic Care Management (AMB) (Signed)
  Chronic Care Management   Note  08/14/2021 Name: Alison Cordova MRN: [id number] DOB: 1926/02/06  [name] is a 87 y.o. year old female who is a primary care patient of Koberlein, Steele Berg, MD. I reached out to [name] by phone today in response to a referral sent by [name]'s PCP, Caren Macadam, MD.   [name] was given information about Chronic Care Management services today including:  CCM service includes personalized support from designated clinical staff supervised by her physician, including individualized plan of care and coordination with other care providers 24/7 contact phone numbers for assistance for urgent and routine care needs. Service will only be billed when office clinical staff spend 20 minutes or more in a month to coordinate care. Only one practitioner may furnish and bill the service in a calendar month. The patient may stop CCM services at any time (effective at the end of the month) by phone call to the office staff.   [name]  verbally agreed to assistance and services provided by embedded care coordination/care management team today.  Follow up plan:   [name]

## 2021-08-15 ENCOUNTER — Other Ambulatory Visit: Payer: Self-pay | Admitting: Family Medicine

## 2021-09-30 ENCOUNTER — Telehealth: Payer: Self-pay | Admitting: Pharmacist

## 2021-09-30 NOTE — Chronic Care Management (AMB) (Signed)
Chronic Care Management Pharmacy Assistant   Name: Alison Cordova  MRN: [id number] DOB: 04-11-1926  Reason for Encounter: Chart prep for initial visit with [name] the clinical pharmacist on 10/07/2021 at 2:00 in the office.   Conditions to be addressed/monitored: HTN, HLD, CKD Stage 2, GERD, and Hypothyroidism  Recent office visits:  07/14/2021 Micheline Rough MD (PCP) - Patient was seen for essential hypertension and additional issues. Started Ipatropium Bromide 0.03% 2 sprays in each nostril twice daily. Follow up in 6 months  06/03/2021 Colin Benton DO (PCP) - Patient was seen for nasal congestion and additional issues. Started Benzonatete 100 mg tid prn and Molnupiravir 800 mg twice daily. No follow up noted.  12/25/2020 Charlott Rakes LPN - Medicare annual wellness exam  Recent consult visits:  None  Hospital visits:  None  Medications: Outpatient Encounter Medications as of 09/30/2021  Medication Sig   acetaminophen (TYLENOL) 650 MG CR tablet Take 650 mg by mouth every 8 (eight) hours as needed.   amLODipine (NORVASC) 5 MG tablet Take 1 tablet (5 mg total) by mouth daily.   aspirin 325 MG tablet Take 325 mg by mouth daily.   atenolol-chlorthalidone (TENORETIC) 50-25 MG tablet Take 1 tablet by mouth daily.   benzonatate (TESSALON PERLES) 100 MG capsule Take 1 capsule (100 mg total) by mouth 3 (three) times daily as needed.   cetirizine (ZYRTEC) 10 MG tablet Take 1 tablet (10 mg total) by mouth daily.   FLUAD QUADRIVALENT 0.5 ML injection    ipratropium (ATROVENT) 0.03 % nasal spray Place 2 sprays into both nostrils every 12 (twelve) hours.   latanoprost (XALATAN) 0.005 % ophthalmic solution Place 1 drop into both eyes at bedtime.   levothyroxine (SYNTHROID) 50 MCG tablet Take 1 tablet (50 mcg total) by mouth daily.   lisinopril (ZESTRIL) 20 MG tablet TAKE 1 TABLET BY MOUTH EVERY DAY   mirabegron ER (MYRBETRIQ) 50 MG TB24 tablet Take 50 mg by mouth 3 (three) times a  week.   Multiple Vitamins-Minerals (PRESERVISION AREDS PO) Take by mouth daily.   Multiple Vitamins-Minerals (WOMENS MULTIVITAMIN PLUS PO) Take 1 tablet by mouth daily.   NON FORMULARY Post Surgical Bras   NON FORMULARY Non-Silicone Breast Prosthesis   NON FORMULARY Silicone Breast Prosthesis   simvastatin (ZOCOR) 20 MG tablet TAKE 1 TABLET BY MOUTH EVERYDAY AT BEDTIME   timolol (BETIMOL) 0.5 % ophthalmic solution Place 1 drop into both eyes 2 (two) times daily.   tobramycin (TOBREX) 0.3 % ophthalmic ointment Place 1 application into the left eye as needed.   UNABLE TO FIND 174.9 Left mastectomy   L8030-Silicone Breast Prosthesis-1   No facility-administered encounter medications on file as of 09/30/2021.   Fill History: ATENOLOL/CHLORTHALIDONE 25MG  / 50MG  TABLET 08/18/2021 90   LEVOTHYROXINE SODIUM 50MCG TABLET 06/30/2021 90   LISINOPRIL 20 MG TABLET 09/24/2021 90   SIMVASTATIN 20 MG TABLET 07/27/2021 90   AMLODIPINE BESYLATE 5MG  TABLET 08/25/2021 90   Have you seen any other providers since your last visit? **yes, Dr Matilde Sprang.   Any changes in your medications or health? no  Any side effects from any medications? no  Do you have an symptoms or problems not managed by your medications? no  Any concerns about your health right now? no  Has your provider asked that you check blood pressure, blood sugar, or follow special diet at home? Yes, she is taking blood pressures, most recent reading was 038/62. Daughter lets her eat what she wants, choices  are generally healthy.  Do you get any type of exercise on a regular basis? No   Can you think of a goal you would like to reach for your health? Stay healthy and live to be 89.  Do you have any problems getting your medications? no  Is there anything that you would like to discuss during the appointment? Daughter has a question about the myrbetriq causing blood pressure to elevate and feels this could be why she is on 3 blood  pressure medications.  Please bring blood pressure cuff, medications and supplements to appointment  Care Gaps: AWV - completed 12/25/2020 Last BP - 130/60 on 07/14/2021 Shingrix - never done TDAP - over due Covid vaccine - overdue  Star Rating Drugs: Lisinopril 20 mg - last filled 09/24/2021 90 [facility] Simvastatin 20 mg - last filled 07/27/2021 90 DS at [facility] Pharmacist Assistant [phone]

## 2021-10-06 ENCOUNTER — Telehealth: Payer: Self-pay | Admitting: Pharmacist

## 2021-10-06 NOTE — Chronic Care Management (AMB) (Signed)
    Chronic Care Management Pharmacy Assistant   Name: Alison Cordova  MRN: [id number] DOB: [birth year]  10/07/2021 APPOINTMENT REMINDER  [name] was reminded to have all medications, supplements and any blood glucose and blood pressure readings available for review with [name], Pharm. D, at her office visit on 10/07/2021 at 2:00.   Questions: Have you had any recent office visit or specialist visit outside of [facility]? No  Are there any concerns you would like to discuss during your office visit? [facility] will no longer be patients pharmacy next year. It will likely be [facility]  Are you having any problems obtaining your medications? (Whether it pharmacy issues or cost) No  If patient has any PAP medications ask if they are having any problems getting their PAP medication or refill? No  Care Gaps: AWV - completed 12/25/2020 Last BP - 130/60 on 07/14/2021 Shingrix - never done TDAP - over due Covid vaccine - overdue  Star Rating Drug: Lisinopril 20 mg - last filled 09/24/2021 90 [facility] Simvastatin 20 mg - last filled 07/27/2021 90 [facility]   Any gaps in medications fill history? No  [place]  Catering manager [phone]

## 2021-10-07 ENCOUNTER — Ambulatory Visit (INDEPENDENT_AMBULATORY_CARE_PROVIDER_SITE_OTHER): Payer: PPO | Admitting: Pharmacist

## 2021-10-07 DIAGNOSIS — I10 Essential (primary) hypertension: Secondary | ICD-10-CM

## 2021-10-07 DIAGNOSIS — E039 Hypothyroidism, unspecified: Secondary | ICD-10-CM

## 2021-10-07 NOTE — Progress Notes (Signed)
Chronic Care Management Pharmacy Note  10/13/2021 Name:  Alison Cordova MRN:  [id number] DOB:  07/21/1926  Summary: BP mostly at goal < 140/90 per office readings Pt requested a podiatrist referral  Recommendations/Changes made from today's visit: -Requested podiatry referral for nail trim -Recommend decreasing aspirin to 81 mg daily for secondary prevention -Recommended weekly BP monitoring at home -Recommended moving amlodipine and lisinopril to the evening  Plan: Follow up BP assessment in 1-2 months   Subjective: Alison Cordova is an 86 y.o. year old female who is a primary patient of Koberlein, Steele Berg, MD.  The CCM team was consulted for assistance with disease management and care coordination needs.    Engaged with patient face to face for initial visit in response to provider referral for pharmacy case management and/or care coordination services.   Consent to Services:  The patient was given the following information about Chronic Care Management services today, agreed to services, and gave verbal consent: 1. CCM service includes personalized support from designated clinical staff supervised by the primary care provider, including individualized plan of care and coordination with other care providers 2. 24/7 contact phone numbers for assistance for urgent and routine care needs. 3. Service will only be billed when office clinical staff spend 20 minutes or more in a month to coordinate care. 4. Only one practitioner may furnish and bill the service in a calendar month. 5.The patient may stop CCM services at any time (effective at the end of the month) by phone call to the office staff. 6. The patient will be responsible for cost sharing (co-pay) of up to 20% of the service fee (after annual deductible is met). Patient agreed to services and consent obtained.  Patient Care Team: Caren Macadam, MD as PCP - General (Family Medicine) Lafayette Dragon, MD (Inactive)  (Gastroenterology) Heath Lark, MD as Consulting Physician (Hematology and Oncology) Bjorn Loser, MD as Consulting Physician (Urology) Sherlynn Stalls, MD as Consulting Physician (Ophthalmology) [name], [facility] as Pharmacist (Pharmacist)  Recent office visits: 07/14/2021 Micheline Rough MD (PCP) - Patient was seen for essential hypertension and additional issues. Started Ipatropium Bromide 0.03% 2 sprays in each nostril twice daily. Follow up in 6 months   06/03/2021 Colin Benton DO (PCP) - Patient was seen for nasal congestion and additional issues. Started Benzonatate 100 mg tid prn and Molnupiravir 800 mg twice daily. No follow up noted.   12/25/2020 Charlott Rakes LPN - Medicare annual wellness exam  Recent consult visits: 08/14/21 Bjorn Loser (urology): Patient presented for mixed incontinence. Unable to access notes.  07/23/21 Sherlynn Stalls (ophthalomology): Patient presented for vitreous degeneration. Administered aflibercept injection. Unable to access notes.   Hospital visits: None in previous 6 months   Objective:  Lab Results  Component Value Date   CREATININE 1.40 (H) 07/14/2021   BUN 23 07/14/2021   GFR 32.57 (L) 07/14/2021   GFRNONAA 42 (L) 06/09/2010   GFRAA (L) 06/09/2010    51        The eGFR has been calculated using the MDRD equation. This calculation has not been validated in all clinical situations. eGFR's persistently <60 mL/min signify possible Chronic Kidney Disease.   NA 141 07/14/2021   K 4.1 07/14/2021   CALCIUM 9.9 07/14/2021   CO2 27 07/14/2021   GLUCOSE 97 07/14/2021    Lab Results  Component Value Date/Time   HGBA1C 5.5 08/14/2019 09:31 AM   HGBA1C 5.8 11/21/2018 09:43 AM   GFR 32.57 (L)  07/14/2021 01:24 PM   GFR 30.62 (L) 02/14/2020 10:23 AM    Last diabetic Eye exam: No results found for: HMDIABEYEEXA  Last diabetic Foot exam: No results found for: HMDIABFOOTEX   Lab Results  Component Value Date   CHOL 156  07/14/2021   HDL 53.30 07/14/2021   LDLCALC 72 07/14/2021   LDLDIRECT 115.9 01/04/2012   TRIG 155.0 (H) 07/14/2021   CHOLHDL 3 07/14/2021    Hepatic Function Latest Ref Rng & Units 07/14/2021 02/21/2014 02/21/2013  Total Protein 6.0 - 8.3 g/dL 7.6 7.9 8.3  Albumin 3.5 - 5.2 g/dL 4.3 4.8 4.7  AST 0 - 37 U/L _0 ALT 0 - 35 U/L _1 Alk Phosphatase 39 - 117 U/L 52 45 43  Total Bilirubin 0.2 - 1.2 mg/dL 0.6 0.6 0.7  Bilirubin, Direct 0.0 - 0.3 mg/dL - 0.0 0.1    Lab Results  Component Value Date/Time   TSH 3.15 07/14/2021 01:24 PM   TSH 2.61 02/14/2020 10:23 AM    CBC Latest Ref Rng & Units 07/14/2021 02/14/2020 04/25/2018  WBC 4.0 - 10.5 K/uL 6.1 6.2 9.0  Hemoglobin 12.0 - 15.0 g/dL 12.6 13.0 12.2  Hematocrit 36.0 - 46.0 % 38.6 38.2 36.0  Platelets 150.0 - 400.0 K/uL 245.0 245.0 222.0    Lab Results  Component Value Date/Time   VD25OH 49.69 04/16/2015 10:26 AM   VD25OH 59 02/21/2013 09:49 AM   VD25OH 51 02/20/2009 05:23 PM    Clinical ASCVD: Yes  The ASCVD Risk score (Arnett DK, et al., 2019) failed to calculate for the following reasons:   The 2019 ASCVD risk score is only valid for ages 70 to 14    Depression screen PHQ 2/9 12/25/2020 12/21/2019 06/23/2019  Decreased Interest 0 0 0  Down, Depressed, Hopeless 0 0 1  PHQ - 2 Score 0 0 1  Altered sleeping - - -  Tired, decreased energy - - -  Change in appetite - - -  Feeling bad or failure about yourself  - - -  Trouble concentrating - - -  Moving slowly or fidgety/restless - - -  Suicidal thoughts - - -  PHQ-9 Score - - -      Social History   Tobacco Use  Smoking Status Former   Types: Cigarettes   Quit date: 11/23/1962   Years since quitting: 58.9  Smokeless Tobacco Never   BP Readings from Last 3 Encounters:  07/14/21 130/60  06/03/21 (!) 164/71  04/08/20 118/60   Pulse Readings from Last 3 Encounters:  07/14/21 (!) 57  04/08/20 64  02/06/20 63   Wt Readings from Last 3 Encounters:  07/14/21  106 lb 9.6 oz (48.4 kg)  05/07/20 110 lb (49.9 kg)  04/08/20 110 lb 12.8 oz (50.3 kg)   BMI Readings from Last 3 Encounters:  07/14/21 20.82 kg/m  05/07/20 21.48 kg/m  04/08/20 21.64 kg/m    Assessment/Interventions: Review of patient past medical history, allergies, medications, health status, including review of consultants reports, laboratory and other test data, was performed as part of comprehensive evaluation and provision of chronic care management services.   SDOH:  (Social Determinants of Health) assessments and interventions performed: Yes SDOH Interventions    Flowsheet Row Most Recent Value  SDOH Interventions   Financial Strain Interventions Intervention Not Indicated  Transportation Interventions Intervention Not Indicated      SDOH Screenings   Alcohol Screen: Not on file  Depression (QHU7-6): Low Risk  PHQ-2 Score: 0  Financial Resource Strain: Low Risk    Difficulty of Paying Living Expenses: Not hard at all  Food Insecurity: No Food Insecurity   Worried About Charity fundraiser in the Last Year: Never true   Ran Out of Food in the Last Year: Never true  Housing: Low Risk    Last Housing Risk Score: 0  Physical Activity: Inactive   Days of Exercise per Week: 0 days   Minutes of Exercise per Session: 0 min  Social Connections: Socially Isolated   Frequency of Communication with Friends and Family: Never   Frequency of Social Gatherings with Friends and Family: Once a week   Attends Religious Services: Never   Marine scientist or Organizations: No   Attends Archivist Meetings: Never   Marital Status: Widowed  Stress: No Stress Concern Present   Feeling of Stress : Not at all  Tobacco Use: Medium Risk   Smoking Tobacco Use: Former   Smokeless Tobacco Use: Never   Passive Exposure: Not on file  Transportation Needs: No Transportation Needs   Lack of Transportation (Medical): No   Lack of Transportation (Non-Medical): No    Patient presented to the office visit with her daughter who provided details as patient has some memory loss concerns and is hard of hearing.  Patient denies any issues with her current medications but previously she was unable to swallow the potassium tablets and just stopped them. Reassured her they were not needed right now but can consider liquid form in the future if needed.  Patient likes the [facility] mail order pharmacy and is sad that they are no longer going to be contracted with [facility] next year. They are undecided for what pharmacy they will switch to.   CCM Care Plan  No Known Allergies  Medications Reviewed Today     Reviewed by [name], [facility] (Pharmacist) on 10/07/21 at 1442  Med List Status: <None>   Medication Order Taking? Sig Documenting Provider Last Dose Status Informant  acetaminophen (TYLENOL) 650 MG CR tablet [id number] Yes Take 650 mg by mouth every 8 (eight) hours as needed. [provider] Taking Active   amLODipine (NORVASC) 5 MG tablet [id number] Yes Take 1 tablet (5 mg total) by mouth daily. Caren Macadam, MD Taking Active   aspirin 325 MG tablet [id number] Yes Take 325 mg by mouth daily. [provider] Taking Active   atenolol-chlorthalidone (TENORETIC) 50-25 MG tablet [id number] Yes Take 1 tablet by mouth daily. Caren Macadam, MD Taking Active   cetirizine (ZYRTEC) 10 MG tablet [id number] Yes Take 1 tablet (10 mg total) by mouth daily. Noralee Space, MD Taking Active   fluticasone [facility]) 50 MCG/ACT nasal spray [id number] Yes Place 1 spray into both nostrils in the morning and at bedtime. [provider] Taking Active   ipratropium (ATROVENT) 0.03 % nasal spray [id number] Yes Place 2 sprays into both nostrils every 12 (twelve) hours. Caren Macadam, MD Taking Active   latanoprost (XALATAN) 0.005 % ophthalmic solution [id number] Yes Place 1 drop into both eyes at bedtime. [provider] Taking Active    levothyroxine (SYNTHROID) 50 MCG tablet [id number] Yes Take 1 tablet (50 mcg total) by mouth daily. Caren Macadam, MD Taking Active   lisinopril (ZESTRIL) 20 MG tablet [id number] Yes TAKE 1 TABLET BY MOUTH EVERY DAY Koberlein, Junell C, MD Taking Active   mirabegron ER (MYRBETRIQ) 50 MG TB24 tablet [id number] Yes Take 50 mg by  mouth 3 (three) times a week. [provider] Taking Active   Multiple Vitamins-Minerals (PRESERVISION AREDS PO) [id number] Yes Take by mouth daily. [provider] Taking Active   Multiple Vitamins-Minerals (WOMENS MULTIVITAMIN PLUS PO) [id number] Yes Take 1 tablet by mouth daily. [provider] Taking Active   Baruch Gouty [id number]  [place] [provider]  Active   Baruch Gouty [id number]  Non-Silicone Breast Prosthesis [provider]  Active   simvastatin (ZOCOR) 20 MG tablet [id number] Yes TAKE 1 TABLET BY MOUTH EVERYDAY AT BEDTIME Koberlein, Junell C, MD Taking Active   timolol (BETIMOL) 0.5 % ophthalmic solution [id number] Yes Place 1 drop into both eyes 2 (two) times daily. [provider] Taking Active   tobramycin (TOBREX) 0.3 % ophthalmic ointment [id number] Yes Place 1 application into the left eye as needed. [provider] Taking Active             Patient Active Problem List   Diagnosis Date Noted   Hyperlipemia 03/27/2016   Anemia 06/17/2012   Chronic kidney disease (CKD), stage II (mild) 06/17/2012   Cerebrovascular disease 06/27/2010   Malignant neoplasm of female breast ([place]) 02/19/2010   Glaucoma 09/02/2008   Hypothyroidism 03/14/2008   Allergic rhinitis 03/14/2008   Urinary incontinence 03/14/2008   Essential hypertension 09/17/2007   GERD 09/17/2007    Immunization History  Administered Date(s) Administered   Fluad Quad(high Dose 65+) 07/27/2019   Influenza Split 08/13/2011, 08/26/2012   Influenza Whole 08/21/2008, 08/21/2009   Influenza, High Dose Seasonal PF  08/19/2015, 07/30/2016   Influenza,inj,Quad PF,6+ Mos 08/27/2014, 08/20/2017, 08/15/2018   Influenza-Unspecified 08/23/2013, 08/16/2020, 09/08/2021   PFIZER(Purple Top)SARS-COV-2 Vaccination 01/06/2020, 01/29/2020, 09/11/2020   Pneumococcal Conjugate-13 08/27/2014   Pneumococcal Polysaccharide-23 09/26/2015   Td 02/19/2010   Zoster, Live 03/28/2015    Conditions to be addressed/monitored:  Hypertension, Hyperlipidemia, Hypothyroidism, Overactive Bladder, and Allergic Rhinitis  Care Plan : [place]  Updates made by [name], [place] since 10/13/2021 12:00 AM     Problem: Problem: Hypertension, Hyperlipidemia, Hypothyroidism, Overactive Bladder, and Allergic Rhinitis      Long-Range Goal: Patient-Specific Goal   Start Date: 10/07/2021  Expected End Date: 10/07/2022  This Visit's Progress: On track  Priority: High  Note:   Current Barriers:  Unable to independently monitor therapeutic efficacy Unable to maintain control of blood pressure  Pharmacist Clinical Goal(s):  Patient will achieve adherence to monitoring guidelines and medication adherence to achieve therapeutic efficacy maintain control of blood pressure as evidenced by home blood pressure readings  through collaboration with PharmD and provider.   Interventions: 1:1 collaboration with Caren Macadam, MD regarding development and update of comprehensive plan of care as evidenced by provider attestation and co-signature Inter-disciplinary care team collaboration (see longitudinal plan of care) Comprehensive medication review performed; medication list updated in electronic medical record  Hypertension (BP goal <140/90) -Not ideally controlled -Current treatment: Amlodipine 5 mg 1 tablet daily Atenolol-chlorthalidone 50-25 mg 1 tablet daily Lisinopril 20 mg 1 tablet daily -Medications previously tried: none  -Current home readings: uses her cuff with husband and daughter (trying to do it once  weekly, pt doesn't want to take clothes - uses arm cuff) -Current dietary habits: does eat a lot of salt - daughter tries to curb her; uses not a salt substitute  -Current exercise habits: none -Denies hypotensive/hypertensive symptoms -Educated on BP goals and benefits of medications for prevention of heart attack, stroke and kidney damage; Importance of home blood  pressure monitoring; Proper BP monitoring technique; -Counseled to monitor BP at home weekly, document, and provide log at future appointments -Counseled on diet and exercise extensively Recommended to continue current medication Recommended moving amlodipine and lisinopril to the evening to spread out BP control.  Hyperlipidemia: (LDL goal < 70) -Not ideally controlled -Current treatment: Simvastatin 20 mg 1 tablet at bedtime -Medications previously tried: none  -Current dietary patterns: uses vegetable oil  -Current exercise habits: none -Educated on Cholesterol goals;  Benefits of statin for ASCVD risk reduction; Importance of limiting foods high in cholesterol; -Counseled on diet and exercise extensively Recommended to continue current medication Recommended olive oil.  Cerebrovascular disease (Goal: prevent events) -Not ideally controlled -Current treatment  Simvastatin 20 mg 1 tablet at bedtime Aspirin 325 mg 1 tablet daily -Medications previously tried: none  -Counseled on risk vs benefits with high dose aspirin.  Allergic rhinitis (Goal: minimize symptoms) -Controlled -Current treatment  Cetirizine 10 mg 1 tablet daily Ipratropium nasal spray 0.03% 2 sprays in both nostrils every 12 hours Flonase 1 spray twice daily - not taking with ipratropium -Medications previously tried: none  -Recommended to continue current medication  Hypothyroidism (Goal: 0.35-4.5) -Controlled -Current treatment  Levothyroxine 50 mcg 1 tablet daily -Medications previously tried: none  -Counseled on taking the medication 30  minutes prior to eating and with water.  Overactive bladder (Goal: minimize symptoms) -Controlled -Current treatment  Mirabegron 50 mg 1 tablet three times weekly -Medications previously tried: none  - Patient is getting samples from urologist.  Glaucoma (Goal: lower intraocular pressure) -Controlled -Current treatment  Latanoprost 0.005% 1 drop in both eyes at bedtime Timolol 0.5% 1 drop in both eyes twice daily Tobramycin 0.3% ointment in left eye as needed for eye injections -Medications previously tried: none  -Recommended to continue current medication   Health Maintenance -Vaccine gaps: shingrix, tetanus, COVID booster -Current therapy:  Multivitamin 1 tablet daily Acetaminophen 650 mg 1 tablet as needed Preservision areds 1 capsule twice daily -Educated on Cost vs benefit of each product must be carefully weighed by individual consumer -Patient is satisfied with current therapy and denies issues -Recommended to continue current medication  Patient Goals/Self-Care Activities Patient will:  - take medications as prescribed as evidenced by patient report and record review check blood pressure weekly, document, and provide at future appointments  Follow Up Plan: The care management team will reach out to the patient again over the next 30 days.        Medication Assistance: None required.  Patient affirms current coverage meets needs.  Compliance/Adherence/Medication fill history: Care Gaps: Shingrix, tetanus, COVID booster Last BP - 130/60 on 07/14/2021  Star-Rating Drugs: Lisinopril 20 mg - last [facility]/2021 [facility] Simvastatin 20 mg - last filled 07/27/2021 [facility]  Patient's preferred pharmacy is:  [facility]) - [place], [address] [place] [address] Phone: [phone] Fax: [phone]  [id number] [address], [place]  [place] [address] Phone: [phone] Fax: [phone]  Uses pill box? Yes - 7 days Pt endorses 95% compliance - misses supper time sometimes  We discussed: Benefits of medication synchronization, packaging and delivery as well as enhanced pharmacist oversight with [employer]. Patient decided to: Continue current medication management strategy  Care Plan and Follow Up Patient Decision:  Patient agrees to Care Plan and Follow-up.  Plan: The care management team will reach out to  the patient again over the next 30 days.  [name], PharmD, [place] Pharmacist [place] at [place]

## 2021-10-13 NOTE — Patient Instructions (Signed)
Hi Alison Cordova and Alison Cordova,  It was great to get to meet you in person! Below is a summary of some of the topics we discussed.   Keep on checking your blood pressure at least once a week and we will check back in to see how your numbers are. Also, as reminder go ahead and switch your amlodipine and lisinopril to the evening with your simvastatin to even out your blood pressure lowering during the day.  I'll reach out  once I head back from Dr. Ethlyn Gallery.  Please reach out to me if you have any questions or need anything!  Best, Maddie  [name], PharmD, [facility] [place]   Visit Information   Goals Addressed             This Visit's Progress    Track and Manage My Blood Pressure-Hypertension       Timeframe:  Long-Range Goal Priority:  Medium Start Date:                             Expected End Date:                       Follow Up Date 12/12/21    - check blood pressure weekly - choose a place to take my blood pressure (home, clinic or office, retail store) - write blood pressure results in a log or diary    Why is this important?   You won't feel high blood pressure, but it can still hurt your blood vessels.  High blood pressure can cause heart or kidney problems. It can also cause a stroke.  Making lifestyle changes like losing a little weight or eating less salt will help.  Checking your blood pressure at home and at different times of the day can help to control blood pressure.  If the doctor prescribes medicine remember to take it the way the doctor ordered.  Call the office if you cannot afford the medicine or if there are questions about it.     Notes:        Patient Care Plan: CCM Pharmacy Care Plan     Problem Identified: Problem: Hypertension, Hyperlipidemia, Hypothyroidism, Overactive Bladder, and Allergic Rhinitis      Long-Range Goal: Patient-Specific Goal   Start Date: 10/07/2021  Expected End  Date: 10/07/2022  This Visit's Progress: On track  Priority: High  Note:   Current Barriers:  Unable to independently monitor therapeutic efficacy Unable to maintain control of blood pressure  Pharmacist Clinical Goal(s):  Patient will achieve adherence to monitoring guidelines and medication adherence to achieve therapeutic efficacy maintain control of blood pressure as evidenced by home blood pressure readings  through collaboration with PharmD and provider.   Interventions: 1:1 collaboration with Caren Macadam, MD regarding development and update of comprehensive plan of care as evidenced by provider attestation and co-signature Inter-disciplinary care team collaboration (see longitudinal plan of care) Comprehensive medication review performed; medication list updated in electronic medical record  Hypertension (BP goal <140/90) -Not ideally controlled -Current treatment: Amlodipine 5 mg 1 tablet daily Atenolol-chlorthalidone 50-25 mg 1 tablet daily Lisinopril 20 mg 1 tablet daily -Medications previously tried: none  -Current home readings: uses her cuff with husband and daughter (trying to do it once weekly, pt doesn't want to take clothes - uses arm cuff) -Current dietary habits: does eat a lot of salt - daughter tries  to curb her; uses not a salt substitute  -Current exercise habits: none -Denies hypotensive/hypertensive symptoms -Educated on BP goals and benefits of medications for prevention of heart attack, stroke and kidney damage; Importance of home blood pressure monitoring; Proper BP monitoring technique; -Counseled to monitor BP at home weekly, document, and provide log at future appointments -Counseled on diet and exercise extensively Recommended to continue current medication Recommended moving amlodipine and lisinopril to the evening to spread out BP control.  Hyperlipidemia: (LDL goal < 70) -Not ideally controlled -Current treatment: Simvastatin 20 mg 1  tablet at bedtime -Medications previously tried: none  -Current dietary patterns: uses vegetable oil  -Current exercise habits: none -Educated on Cholesterol goals;  Benefits of statin for ASCVD risk reduction; Importance of limiting foods high in cholesterol; -Counseled on diet and exercise extensively Recommended to continue current medication Recommended olive oil.  Cerebrovascular disease (Goal: prevent events) -Not ideally controlled -Current treatment  Simvastatin 20 mg 1 tablet at bedtime Aspirin 325 mg 1 tablet daily -Medications previously tried: none  -Counseled on risk vs benefits with high dose aspirin.  Allergic rhinitis (Goal: minimize symptoms) -Controlled -Current treatment  Cetirizine 10 mg 1 tablet daily Ipratropium nasal spray 0.03% 2 sprays in both nostrils every 12 hours Flonase 1 spray twice daily - not taking with ipratropium -Medications previously tried: none  -Recommended to continue current medication  Hypothyroidism (Goal: 0.35-4.5) -Controlled -Current treatment  Levothyroxine 50 mcg 1 tablet daily -Medications previously tried: none  -Counseled on taking the medication 30 minutes prior to eating and with water.  Overactive bladder (Goal: minimize symptoms) -Controlled -Current treatment  Mirabegron 50 mg 1 tablet three times weekly -Medications previously tried: none  - Patient is getting samples from urologist.  Glaucoma (Goal: lower intraocular pressure) -Controlled -Current treatment  Latanoprost 0.005% 1 drop in both eyes at bedtime Timolol 0.5% 1 drop in both eyes twice daily Tobramycin 0.3% ointment in left eye as needed for eye injections -Medications previously tried: none  -Recommended to continue current medication   Health Maintenance -Vaccine gaps: shingrix, tetanus, COVID booster -Current therapy:  Multivitamin 1 tablet daily Acetaminophen 650 mg 1 tablet as needed Preservision areds 1 capsule twice daily -Educated  on Cost vs benefit of each product must be carefully weighed by individual consumer -Patient is satisfied with current therapy and denies issues -Recommended to continue current medication  Patient Goals/Self-Care Activities Patient will:  - take medications as prescribed as evidenced by patient report and record review check blood pressure weekly, document, and provide at future appointments  Follow Up Plan: The care management team will reach out to the patient again over the next 30 days.       Ms. Phillippi was given information about Chronic Care Management services today including:  CCM service includes personalized support from designated clinical staff supervised by her physician, including individualized plan of care and coordination with other care providers 24/7 contact phone numbers for assistance for urgent and routine care needs. Standard insurance, coinsurance, copays and deductibles apply for chronic care management only during months in which we provide at least 20 minutes of these services. Most insurances cover these services at 100%, however patients may be responsible for any copay, coinsurance and/or deductible if applicable. This service may help you avoid the need for more expensive face-to-face services. Only one practitioner may furnish and bill the service in a calendar month. The patient may stop CCM services at any time (effective at the end of the month) by  phone call to the office staff.  Patient agreed to services and verbal consent obtained.   Patient verbalizes understanding of instructions provided today and agrees to view in [place].  The pharmacy team will reach out to the patient again over the next 30 days.   [name], [facility]

## 2021-10-14 ENCOUNTER — Other Ambulatory Visit: Payer: Self-pay | Admitting: Family Medicine

## 2021-10-14 DIAGNOSIS — L602 Onychogryphosis: Secondary | ICD-10-CM

## 2021-10-19 ENCOUNTER — Other Ambulatory Visit: Payer: Self-pay | Admitting: Family Medicine

## 2021-10-21 ENCOUNTER — Ambulatory Visit: Payer: PPO | Admitting: Podiatry

## 2021-10-21 ENCOUNTER — Other Ambulatory Visit: Payer: Self-pay

## 2021-10-21 ENCOUNTER — Encounter: Payer: Self-pay | Admitting: Podiatry

## 2021-10-21 DIAGNOSIS — M79675 Pain in left toe(s): Secondary | ICD-10-CM

## 2021-10-21 DIAGNOSIS — B351 Tinea unguium: Secondary | ICD-10-CM

## 2021-10-21 DIAGNOSIS — M79674 Pain in right toe(s): Secondary | ICD-10-CM | POA: Diagnosis not present

## 2021-10-21 NOTE — Progress Notes (Signed)
  Subjective:  Patient ID: Alison Cordova, female    DOB: December 13, 1925,  MRN: [id number]  Chief Complaint  Patient presents with   Nail Problem    NP  Thick painful elongated toenails    87 y.o. female presents with the above complaint. History confirmed with patient.  Here with her daughter as well  Objective:  Physical Exam: warm, good capillary refill, no trophic changes or ulcerative lesions, normal DP and PT pulses, normal sensory exam, and onychomycosis with thickened elongated yellowed nail plates 1 through 3 bilateral. Assessment:   1. Pain due to onychomycosis of toenails of both feet      Plan:  Patient was evaluated and treated and all questions answered.  Discussed the etiology and treatment options for the condition in detail with the patient. Educated patient on the topical and oral treatment options for mycotic nails. Recommended debridement of the nails today. Sharp and mechanical debridement performed of all painful and mycotic nails today. Nails debrided in length and thickness using a nail nipper to level of comfort. Discussed treatment options including appropriate shoe gear. Follow up as needed for painful nails.    Return in about 3 months (around 01/20/2022) for at risk diabetic foot care.

## 2021-10-22 DIAGNOSIS — I10 Essential (primary) hypertension: Secondary | ICD-10-CM

## 2021-10-22 DIAGNOSIS — E039 Hypothyroidism, unspecified: Secondary | ICD-10-CM | POA: Diagnosis not present

## 2021-10-22 DIAGNOSIS — E785 Hyperlipidemia, unspecified: Secondary | ICD-10-CM

## 2021-10-24 ENCOUNTER — Other Ambulatory Visit: Payer: Self-pay | Admitting: Family Medicine

## 2021-10-29 DIAGNOSIS — H353112 Nonexudative age-related macular degeneration, right eye, intermediate dry stage: Secondary | ICD-10-CM | POA: Diagnosis not present

## 2021-10-29 DIAGNOSIS — H348312 Tributary (branch) retinal vein occlusion, right eye, stable: Secondary | ICD-10-CM | POA: Diagnosis not present

## 2021-10-29 DIAGNOSIS — H34832 Tributary (branch) retinal vein occlusion, left eye, with macular edema: Secondary | ICD-10-CM | POA: Diagnosis not present

## 2021-10-29 DIAGNOSIS — H43813 Vitreous degeneration, bilateral: Secondary | ICD-10-CM | POA: Diagnosis not present

## 2021-12-17 DIAGNOSIS — N3946 Mixed incontinence: Secondary | ICD-10-CM | POA: Diagnosis not present

## 2021-12-17 DIAGNOSIS — R350 Frequency of micturition: Secondary | ICD-10-CM | POA: Diagnosis not present

## 2021-12-30 ENCOUNTER — Ambulatory Visit (INDEPENDENT_AMBULATORY_CARE_PROVIDER_SITE_OTHER): Payer: PPO

## 2021-12-30 VITALS — BP 124/68 | HR 58 | Ht 61.0 in | Wt 106.0 lb

## 2021-12-30 DIAGNOSIS — Z0000 Encounter for general adult medical examination without abnormal findings: Secondary | ICD-10-CM | POA: Diagnosis not present
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
## 2021-12-30 NOTE — Patient Instructions (Addendum)
Alison Cordova , Thank you for taking time to come for your Medicare Wellness Visit. I appreciate your ongoing commitment to your health goals. Please review the following plan we discussed and let me know if I can assist you in the future.   These are the goals we discussed:  Goals      Increase physical activity     Try to walk more often and longer duration.     Patient Stated     Stay healthy and get up everyday     Track and Manage My Blood Pressure-Hypertension     Timeframe:  Long-Range Goal Priority:  Medium Start Date:                             Expected End Date:                       Follow Up Date 12/12/21    - check blood pressure weekly - choose a place to take my blood pressure (home, clinic or office, retail store) - write blood pressure results in a log or diary    Why is this important?   You won't feel high blood pressure, but it can still hurt your blood vessels.  High blood pressure can cause heart or kidney problems. It can also cause a stroke.  Making lifestyle changes like losing a little weight or eating less salt will help.  Checking your blood pressure at home and at different times of the day can help to control blood pressure.  If the doctor prescribes medicine remember to take it the way the doctor ordered.  Call the office if you cannot afford the medicine or if there are questions about it.     Notes:         This is a list of the screening recommended for you and due dates:  Health Maintenance  Topic Date Due   COVID-19 Vaccine (4 - Booster for Pfizer series) 01/15/2022*   Zoster (Shingles) Vaccine (1 of 2) 03/29/2022*   Mammogram  12/30/2022*   Tetanus Vaccine  12/30/2022*   Pneumonia Vaccine  Completed   Flu Shot  Completed   DEXA scan (bone density measurement)  Completed   HPV Vaccine  Aged Out  *Topic was postponed. The date shown is not the original due date.     Advanced directives: Yes Documents on file  Conditions/risks  identified: None  Next appointment: Follow up in one year for your annual wellness visit    Preventive Care 65 Years and Older, Female Preventive care refers to lifestyle choices and visits with your health care provider that can promote health and wellness. What does preventive care include? A yearly physical exam. This is also called an annual well check. Dental exams once or twice a year. Routine eye exams. Ask your health care provider how often you should have your eyes checked. Personal lifestyle choices, including: Daily care of your teeth and gums. Regular physical activity. Eating a healthy diet. Avoiding tobacco and drug use. Limiting alcohol use. Practicing safe sex. Taking low-dose aspirin every day. Taking vitamin and mineral supplements as recommended by your health care provider. What happens during an annual well check? The services and screenings done by your health care provider during your annual well check will depend on your age, overall health, lifestyle risk factors, and family history of disease. Counseling  Your health care provider may  ask you questions about your: Alcohol use. Tobacco use. Drug use. Emotional well-being. Home and relationship well-being. Sexual activity. Eating habits. History of falls. Memory and ability to understand (cognition). Work and work Statistician. Reproductive health. Screening  You may have the following tests or measurements: Height, weight, and BMI. Blood pressure. Lipid and cholesterol levels. These may be checked every 5 years, or more frequently if you are over 79 years old. Skin check. Lung cancer screening. You may have this screening every year starting at age 79 if you have a 30-pack-year history of smoking and currently smoke or have quit within the past 15 years. Fecal occult blood test (FOBT) of the stool. You may have this test every year starting at age 36. Flexible sigmoidoscopy or colonoscopy. You may  have a sigmoidoscopy every 5 years or a colonoscopy every 10 years starting at age 13. Hepatitis C blood test. Hepatitis B blood test. Sexually transmitted disease (STD) testing. Diabetes screening. This is done by checking your blood sugar (glucose) after you have not eaten for a while (fasting). You may have this done every 1-3 years. Bone density scan. This is done to screen for osteoporosis. You may have this done starting at age 51. Mammogram. This may be done every 1-2 years. Talk to your health care provider about how often you should have regular mammograms. Talk with your health care provider about your test results, treatment options, and if necessary, the need for more tests. Vaccines  Your health care provider may recommend certain vaccines, such as: Influenza vaccine. This is recommended every year. Tetanus, diphtheria, and acellular pertussis (Tdap, Td) vaccine. You may need a Td booster every 10 years. Zoster vaccine. You may need this after age 42. Pneumococcal 13-valent conjugate (PCV13) vaccine. One dose is recommended after age 29. Pneumococcal polysaccharide (PPSV23) vaccine. One dose is recommended after age 87. Talk to your health care provider about which screenings and vaccines you need and how often you need them. This information is not intended to replace advice given to you by your health care provider. Make sure you discuss any questions you have with your health care provider. Document Released: 12/06/2015 Document Revised: 07/29/2016 Document Reviewed: 09/10/2015 Elsevier Interactive Patient Education  2017 [place] Prevention in the Home Falls can cause injuries. They can happen to people of all ages. There are many things you can do to make your home safe and to help prevent falls. What can I do on the outside of my home? Regularly fix the edges of walkways and driveways and fix any cracks. Remove anything that might make you trip as you walk  through a door, such as a raised step or threshold. Trim any bushes or trees on the path to your home. Use bright outdoor lighting. Clear any walking paths of anything that might make someone trip, such as rocks or tools. Regularly check to see if handrails are loose or broken. Make sure that both sides of any steps have handrails. Any raised decks and porches should have guardrails on the edges. Have any leaves, snow, or ice cleared regularly. Use sand or salt on walking paths during winter. Clean up any spills in your garage right away. This includes oil or grease spills. What can I do in the bathroom? Use night lights. Install grab bars by the toilet and in the tub and shower. Do not use towel bars as grab bars. Use non-skid mats or decals in the tub or shower. If you need  to sit down in the shower, use a plastic, non-slip stool. Keep the floor dry. Clean up any water that spills on the floor as soon as it happens. Remove soap buildup in the tub or shower regularly. Attach bath mats securely with double-sided non-slip rug tape. Do not have throw rugs and other things on the floor that can make you trip. What can I do in the bedroom? Use night lights. Make sure that you have a light by your bed that is easy to reach. Do not use any sheets or blankets that are too big for your bed. They should not hang down onto the floor. Have a firm chair that has side arms. You can use this for support while you get dressed. Do not have throw rugs and other things on the floor that can make you trip. What can I do in the kitchen? Clean up any spills right away. Avoid walking on wet floors. Keep items that you use a lot in easy-to-reach places. If you need to reach something above you, use a strong step stool that has a grab bar. Keep electrical cords out of the way. Do not use floor polish or wax that makes floors slippery. If you must use wax, use non-skid floor wax. Do not have throw rugs and  other things on the floor that can make you trip. What can I do with my stairs? Do not leave any items on the stairs. Make sure that there are handrails on both sides of the stairs and use them. Fix handrails that are broken or loose. Make sure that handrails are as long as the stairways. Check any carpeting to make sure that it is firmly attached to the stairs. Fix any carpet that is loose or worn. Avoid having throw rugs at the top or bottom of the stairs. If you do have throw rugs, attach them to the floor with carpet tape. Make sure that you have a light switch at the top of the stairs and the bottom of the stairs. If you do not have them, ask someone to add them for you. What else can I do to help prevent falls? Wear shoes that: Do not have high heels. Have rubber bottoms. Are comfortable and fit you well. Are closed at the toe. Do not wear sandals. If you use a stepladder: Make sure that it is fully opened. Do not climb a closed stepladder. Make sure that both sides of the stepladder are locked into place. Ask someone to hold it for you, if possible. Clearly mark and make sure that you can see: Any grab bars or handrails. First and last steps. Where the edge of each step is. Use tools that help you move around (mobility aids) if they are needed. These include: Canes. Walkers. Scooters. Crutches. Turn on the lights when you go into a dark area. Replace any light bulbs as soon as they burn out. Set up your furniture so you have a clear path. Avoid moving your furniture around. If any of your floors are uneven, fix them. If there are any pets around you, be aware of where they are. Review your medicines with your doctor. Some medicines can make you feel dizzy. This can increase your chance of falling. Ask your doctor what other things that you can do to help prevent falls. This information is not intended to replace advice given to you by your health care provider. Make sure you  discuss any questions you have with your health  care provider. Document Released: 09/05/2009 Document Revised: 04/16/2016 Document Reviewed: 12/14/2014 Elsevier Interactive Patient Education  2017 [employer].

## 2021-12-31 ENCOUNTER — Ambulatory Visit: Payer: PPO

## 2022-01-07 ENCOUNTER — Telehealth: Payer: Self-pay | Admitting: Pharmacist

## 2022-01-07 ENCOUNTER — Telehealth: Payer: Self-pay | Admitting: Family Medicine

## 2022-01-07 MED ORDER — SIMVASTATIN 20 MG PO TABS: ORAL_TABLET | ORAL | 0 refills | Status: DC

## 2022-01-07 NOTE — Telephone Encounter (Signed)
Rx done. 

## 2022-01-07 NOTE — Telephone Encounter (Signed)
Pt daughter is calling and pt needs simvastatin (ZOCOR) 20 MG tablet # 66 send to  [facility] ([place], [place] Phone:  [phone]  Fax:  [phone]    No longer wants medication going to local pharm

## 2022-01-07 NOTE — Chronic Care Management (AMB) (Signed)
Chronic Care Management Pharmacy Assistant   Name: Alison Cordova  MRN: [id number] DOB: 01-17-1926  Reason for Encounter: Disease State / Hypertension Assessment Call   Conditions to be addressed/monitored: HTN   Recent office visits:  12/30/2021 Rolene Arbour LPN - Medicare annual wellness exam  Recent consult visits:  10/21/2021 Lanae Crumbly DPM - Patient was seen for Pain due to onychomycosis of toenails of both feet. No medication changes. Follow up in 3 months.  Hospital visits:  None  Medications: Outpatient Encounter Medications as of 01/07/2022  Medication Sig   acetaminophen (TYLENOL) 650 MG CR tablet Take 650 mg by mouth every 8 (eight) hours as needed.   amLODipine (NORVASC) 5 MG tablet Take 1 tablet (5 mg total) by mouth daily.   aspirin 325 MG tablet Take 325 mg by mouth daily.   atenolol-chlorthalidone (TENORETIC) 50-25 MG tablet Take 1 tablet by mouth daily.   cetirizine (ZYRTEC) 10 MG tablet Take 1 tablet (10 mg total) by mouth daily.   fluticasone (FLONASE) 50 MCG/ACT nasal spray Place 1 spray into both nostrils in the morning and at bedtime.   ipratropium (ATROVENT) 0.03 % nasal spray Place 2 sprays into both nostrils every 12 (twelve) hours.   latanoprost (XALATAN) 0.005 % ophthalmic solution Place 1 drop into both eyes at bedtime.   levothyroxine (SYNTHROID) 50 MCG tablet Take 1 tablet (50 mcg total) by mouth daily.   lisinopril (ZESTRIL) 20 MG tablet TAKE 1 TABLET BY MOUTH EVERY DAY   mirabegron ER (MYRBETRIQ) 50 MG TB24 tablet Take 50 mg by mouth 3 (three) times a week.   Multiple Vitamins-Minerals (PRESERVISION AREDS PO) Take by mouth daily.   Multiple Vitamins-Minerals (WOMENS MULTIVITAMIN PLUS PO) Take 1 tablet by mouth daily.   NON FORMULARY Post Surgical Bras   NON FORMULARY Non-Silicone Breast Prosthesis   simvastatin (ZOCOR) 20 MG tablet TAKE 1 TABLET BY MOUTH EVERYDAY AT BEDTIME   timolol (BETIMOL) 0.5 % ophthalmic solution Place 1 drop into  both eyes 2 (two) times daily.   tobramycin (TOBREX) 0.3 % ophthalmic ointment Place 1 application into the left eye as needed.   No facility-administered encounter medications on file as of 01/07/2022.  Fill History: ATENOLOL-CHLORTHALIDONE 50-25 MG TABLET 01/05/2022 90   IPRATROPIUM BROMIDE 0.03% SOLUTION 10/03/2021 86   LEVOTHYROXINE 50 MCG TABLET 01/02/2022 90   LISINOPRIL 20 MG TABLET 11/22/2021 90   SIMVASTATIN 20 MG TABLET 07/27/2021 90   AMLODIPINE 5 MG TABLET 01/05/2022 90   Reviewed chart prior to disease state call. Spoke with patient regarding BP  Recent Office Vitals: BP Readings from Last 3 Encounters:  12/30/21 124/68  07/14/21 130/60  06/03/21 (!) 164/71   Pulse Readings from Last 3 Encounters:  12/30/21 (!) 58  07/14/21 (!) 57  04/08/20 64    Wt Readings from Last 3 Encounters:  12/30/21 106 lb (48.1 kg)  07/14/21 106 lb 9.6 oz (48.4 kg)  05/07/20 110 lb (49.9 kg)     Kidney Function Lab Results  Component Value Date/Time   CREATININE 1.40 (H) 07/14/2021 01:24 PM   CREATININE 1.58 (H) 02/14/2020 10:23 AM   CREATININE 1.7 (H) 12/19/2012 08:17 AM   GFR 32.57 (L) 07/14/2021 01:24 PM   GFRNONAA 42 (L) 06/09/2010 09:00 AM   GFRAA (L) 06/09/2010 09:00 AM    51        The eGFR has been calculated using the MDRD equation. This calculation has not been validated in all clinical situations. eGFR's persistently <60  mL/min signify possible Chronic Kidney Disease.    BMP Latest Ref Rng & Units 07/14/2021 02/14/2020 08/14/2019  Glucose 70 - 99 mg/dL 97 65(L) 96  BUN 6 - 23 mg/dL 23 30(H) 24(H)  Creatinine 0.40 - 1.20 mg/dL 1.40(H) 1.58(H) 1.63(H)  Sodium 135 - 145 mEq/L 141 140 142  Potassium 3.5 - 5.1 mEq/L 4.1 4.1 4.4  Chloride 96 - 112 mEq/L 101 103 101  CO2 19 - 32 mEq/L $Remove'27 30 29  'HTuUDii$ Calcium 8.4 - 10.5 mg/dL 9.9 10.5 10.4    Current antihypertensive regimen:  Amlodipine 5 mg 1 tablet daily Tenoretic 50/25 1 tablet daily Lisinopril 20 mg 1 tablet  daily  How often are you checking your Blood Pressure? Patients daughter has been trying to check it daily  Current home BP readings: Patients blood pressures have been running between 123/56 -130/71  What recent interventions/DTPs have been made by any provider to improve Blood Pressure control since last CPP Visit: No recent interventions   Any recent hospitalizations or ED visits since last visit with CPP? No recent hospital visits.   What diet changes have been made to improve Blood Pressure Control?  Patient is not following any specific diet Breakfast - patient will have rye bread with an egg Lunch - patient doesn't eat lunch due to eating breakfast late [place] - patient will have a meat and vegetable  Snack - patient will eat fruits  What exercise is being done to improve your Blood Pressure Control?  Patient will walk outside when it is warm outside and up and down the hallway other days. Walk the farmers market during the summer months twice a week.  Adherence Review: Is the patient currently on ACE/ARB medication? Yes Does the patient have >5 day gap between last estimated fill dates? Yes   Care Gaps: AWV - completed 12/30/2021 Last BP - 130/60 on 07/14/2021 Last A1C - 5.5 on 08/14/2019  Star Rating Drugs: Lisinopril 20 mg - last filled 11/22/2021 90 DS at [facility] Simvastatin 20 mg - last filled 07/27/2021 90 DS at [facility] verified with pharmacy Patients daughter is planning to call Dr. Ethlyn Gallery to have Simvastatin sent to [facility].   [place] Pharmacist Assistant [phone]

## 2022-01-14 ENCOUNTER — Encounter: Payer: Self-pay | Admitting: Family Medicine

## 2022-01-14 ENCOUNTER — Ambulatory Visit (INDEPENDENT_AMBULATORY_CARE_PROVIDER_SITE_OTHER): Payer: PPO | Admitting: Family Medicine

## 2022-01-14 VITALS — BP 150/68 | HR 57 | Temp 97.8°F | Ht 61.0 in | Wt 107.2 lb

## 2022-01-14 DIAGNOSIS — Z23 Encounter for immunization: Secondary | ICD-10-CM

## 2022-01-14 DIAGNOSIS — E039 Hypothyroidism, unspecified: Secondary | ICD-10-CM

## 2022-01-14 DIAGNOSIS — E785 Hyperlipidemia, unspecified: Secondary | ICD-10-CM | POA: Diagnosis not present

## 2022-01-14 DIAGNOSIS — R32 Unspecified urinary incontinence: Secondary | ICD-10-CM | POA: Diagnosis not present

## 2022-01-14 DIAGNOSIS — I10 Essential (primary) hypertension: Secondary | ICD-10-CM | POA: Diagnosis not present

## 2022-01-14 LAB — COMPREHENSIVE METABOLIC PANEL
ALT: 9 U/L (ref 0–35)
AST: 15 U/L (ref 0–37)
Albumin: 4.6 g/dL (ref 3.5–5.2)
Alkaline Phosphatase: 54 U/L (ref 39–117)
BUN: 26 mg/dL — ABNORMAL HIGH (ref 6–23)
CO2: 29 mEq/L (ref 19–32)
Calcium: 10.4 mg/dL (ref 8.4–10.5)
Chloride: 104 mEq/L (ref 96–112)
Creatinine, Ser: 1.45 mg/dL — ABNORMAL HIGH (ref 0.40–1.20)
GFR: 31.12 mL/min — ABNORMAL LOW (ref >60.00)
Glucose, Bld: 84 mg/dL (ref 70–99)
Potassium: 4.5 mEq/L (ref 3.5–5.1)
Sodium: 140 mEq/L (ref 135–145)
Total Bilirubin: 0.6 mg/dL (ref 0.2–1.2)
Total Protein: 8.1 g/dL (ref 6.0–8.3)

## 2022-01-14 LAB — CBC WITH DIFFERENTIAL/PLATELET
Basophils Absolute: 0 10*3/uL (ref 0.0–0.1)
Basophils Relative: 0.4 % (ref 0.0–3.0)
Eosinophils Absolute: 0 10*3/uL (ref 0.0–0.7)
Eosinophils Relative: 0.7 % (ref 0.0–5.0)
HCT: 39.4 % (ref 36.0–46.0)
Hemoglobin: 12.9 g/dL (ref 12.0–15.0)
Lymphocytes Relative: 20.6 % (ref 12.0–46.0)
Lymphs Abs: 1.3 10*3/uL (ref 0.7–4.0)
MCHC: 32.8 g/dL (ref 30.0–36.0)
MCV: 90.2 fl (ref 78.0–100.0)
Monocytes Absolute: 1.1 10*3/uL — ABNORMAL HIGH (ref 0.1–1.0)
Monocytes Relative: 18.7 % — ABNORMAL HIGH (ref 3.0–12.0)
Neutro Abs: 3.6 10*3/uL (ref 1.4–7.7)
Neutrophils Relative %: 59.6 % (ref 43.0–77.0)
Platelets: 228 10*3/uL (ref 150.0–400.0)
RBC: 4.37 Mil/uL (ref 3.87–5.11)
RDW: 13.9 % (ref 11.5–15.5)
WBC: 6.1 10*3/uL (ref 4.0–10.5)

## 2022-01-14 LAB — LIPID PANEL
Cholesterol: 162 mg/dL (ref 0–200)
HDL: 56.2 mg/dL (ref >39.00)
LDL Cholesterol: 83 mg/dL (ref 0–99)
NonHDL: 106.07
Total CHOL/HDL Ratio: 3
Triglycerides: 114 mg/dL (ref 0.0–149.0)
VLDL: 22.8 mg/dL (ref 0.0–40.0)

## 2022-01-14 LAB — TSH: TSH: 2.55 u[IU]/mL (ref 0.35–5.50)

## 2022-01-14 NOTE — Progress Notes (Signed)
Alison Cordova DOB: Nov 10, 1926 Encounter date: 01/14/2022  This is a 88 y.o. female who presents with Chief Complaint  Patient presents with   Follow-up    History of present illness: Last visit with me was 07/14/2021.  No worries today. Here with daughter.   Daughter feels she doesn't eat well enough. A little more picky with some foods. Weight has been stable in spite of this.   Hypertension: Lisinopril 20 mg, amlodipine 5 mg, Tenoretic 50-25. Does check at home - couple of times where top number high; but rechecks have been ok. She has changed up dosing of bp meds - atenolol in morning, then lisinopril and amlodipine in the evening.   Hypothyroid: Synthroid 50 mcg daily  Incontinence: Follows with urology, Myrbetriq 50 mg. She is still on this - gets up one time/night. Takes just three days/week.   Pharm suggested decreasing asa to 81mg .   No Known Allergies Current Meds  Medication Sig   acetaminophen (TYLENOL) 650 MG CR tablet Take 650 mg by mouth every 8 (eight) hours as needed.   amLODipine (NORVASC) 5 MG tablet Take 1 tablet (5 mg total) by mouth daily.   aspirin 325 MG tablet Take 325 mg by mouth daily.   atenolol-chlorthalidone (TENORETIC) 50-25 MG tablet Take 1 tablet by mouth daily. (Patient taking differently: Take 1 tablet by mouth every morning.)   cetirizine (ZYRTEC) 10 MG tablet Take 1 tablet (10 mg total) by mouth daily.   fluticasone (FLONASE) 50 MCG/ACT nasal spray Place 1 spray into both nostrils in the morning and at bedtime.   ipratropium (ATROVENT) 0.03 % nasal spray Place 2 sprays into both nostrils every 12 (twelve) hours.   latanoprost (XALATAN) 0.005 % ophthalmic solution Place 1 drop into both eyes at bedtime.   levothyroxine (SYNTHROID) 50 MCG tablet Take 1 tablet (50 mcg total) by mouth daily.   lisinopril (ZESTRIL) 20 MG tablet TAKE 1 TABLET BY MOUTH EVERY DAY (Patient taking differently: at bedtime.)   mirabegron ER (MYRBETRIQ) 50 MG TB24 tablet  Take 50 mg by mouth 3 (three) times a week.   Multiple Vitamins-Minerals (PRESERVISION AREDS PO) Take by mouth daily.   Multiple Vitamins-Minerals (WOMENS MULTIVITAMIN PLUS PO) Take 1 tablet by mouth daily.   NON FORMULARY Post Surgical Bras   NON FORMULARY Non-Silicone Breast Prosthesis   simvastatin (ZOCOR) 20 MG tablet TAKE 1 TABLET BY MOUTH EVERYDAY AT BEDTIME   timolol (BETIMOL) 0.5 % ophthalmic solution Place 1 drop into both eyes 2 (two) times daily.   tobramycin (TOBREX) 0.3 % ophthalmic ointment Place 1 application into the left eye as needed.    Review of Systems  Constitutional:  Negative for chills, fatigue and fever.  Respiratory:  Negative for cough, chest tightness, shortness of breath and wheezing.   Cardiovascular:  Negative for chest pain, palpitations and leg swelling.  Musculoskeletal:  Positive for arthralgias (occasional joint pain; tylenol helps prn).   Objective:  BP (!) 150/68 (BP Location: Right Arm, Patient Position: Sitting, Cuff Size: Normal)    Pulse (!) 57    Temp 97.8 F (36.6 C) (Oral)    Ht 5\' 1"  (1.549 m)    Wt 107 lb 3.2 oz (48.6 kg)    SpO2 96%    BMI 20.26 kg/m   Weight: 107 lb 3.2 oz (48.6 kg)   BP Readings from Last 3 Encounters:  01/14/22 (!) 150/68  12/30/21 124/68  07/14/21 130/60   Wt Readings from Last 3 Encounters:  01/14/22 107  lb 3.2 oz (48.6 kg)  12/30/21 106 lb (48.1 kg)  07/14/21 106 lb 9.6 oz (48.4 kg)    Physical Exam Constitutional:      General: She is not in acute distress.    Appearance: She is well-developed.  Cardiovascular:     Rate and Rhythm: Normal rate and regular rhythm.     Heart sounds: Normal heart sounds. No murmur heard.   No friction rub.  Pulmonary:     Effort: Pulmonary effort is normal. No respiratory distress.     Breath sounds: Normal breath sounds. No wheezing or rales.  Musculoskeletal:     Right lower leg: No edema.     Left lower leg: No edema.  Neurological:     Mental Status: She is alert  and oriented to person, place, and time.     Comments: 4/5 tricep strength bilat, 4+/5 bilat arm abduction, hip flexion, quad/hamstring strength.  Psychiatric:        Behavior: Behavior normal.    Assessment/Plan   1. Need for pneumococcal vaccination - Pneumococcal conjugate vaccine 20-valent (Prevnar 20)  2. Essential hypertension Well controlled. Generally lower in afternoon at home. Discussed it is ok to have occasional 676 systolic since diastolic stays low.   3. Hypothyroidism, unspecified type Continue with synthroid.   4. Urinary incontinence, unspecified type Doing great with the myrbetriq.   5. Hyperlipidemia, unspecified hyperlipidemia type Continue with current medication.    Return in about 6 months (around 07/14/2022) for Chronic condition visit.    Micheline Rough, MD

## 2022-01-14 NOTE — Patient Instructions (Signed)
*  today we gave you the prevnar 20.   In two weeks I would suggest getting COVID booster and Tdap at the pharmacy. You can do these at the same time. Your arms may be sore!  Then if desired, you could get shingrix shots. I would suggest waiting a month from prior immunizations for this.

## 2022-01-21 ENCOUNTER — Ambulatory Visit: Payer: PPO | Admitting: Podiatry

## 2022-01-21 ENCOUNTER — Encounter: Payer: Self-pay | Admitting: Podiatry

## 2022-01-21 ENCOUNTER — Other Ambulatory Visit: Payer: Self-pay

## 2022-01-21 DIAGNOSIS — N182 Chronic kidney disease, stage 2 (mild): Secondary | ICD-10-CM | POA: Diagnosis not present

## 2022-01-21 DIAGNOSIS — M79675 Pain in left toe(s): Secondary | ICD-10-CM

## 2022-01-21 DIAGNOSIS — B351 Tinea unguium: Secondary | ICD-10-CM

## 2022-01-21 DIAGNOSIS — M79674 Pain in right toe(s): Secondary | ICD-10-CM | POA: Diagnosis not present

## 2022-01-21 NOTE — Progress Notes (Signed)
This patient returns to my office for at risk foot care.  This patient requires this care by a professional since this patient will be at risk due to having CKD.   This patient presents to the office with her daughter.  This patient is unable to cut nails herself since the patient cannot reach her nails.These nails are painful walking and wearing shoes.  This patient presents for at risk foot care today.  General Appearance  Alert, conversant and in no acute stress.  Vascular  Dorsalis pedis and posterior tibial  pulses are  weakly palpable  bilaterally.  Capillary return is within normal limits  bilaterally. Cold feet bilaterally.  Neurologic  Senn-Weinstein monofilament wire test within normal limits  bilaterally. Muscle power within normal limits bilaterally.  Nails Thick disfigured discolored nails with subungual debris  hallux toenails bilaterally. No evidence of bacterial infection or drainage bilaterally.  Orthopedic  No limitations of motion  feet .  No crepitus or effusions noted.  No bony pathology or digital deformities noted.  Skin  normotropic skin with no porokeratosis noted bilaterally.  No signs of infections or ulcers noted.     Onychomycosis  Pain in right toes  Pain in left toes  Consent was obtained for treatment procedures.   Mechanical debridement of nails 1-5  bilaterally performed with a nail nipper.  Filed with dremel without incident.    Return office visit   4 months                   Told patient to return for periodic foot care and evaluation due to potential at risk complications.   Sahaj Bona DPM   

## 2022-02-04 DIAGNOSIS — H353112 Nonexudative age-related macular degeneration, right eye, intermediate dry stage: Secondary | ICD-10-CM | POA: Diagnosis not present

## 2022-02-04 DIAGNOSIS — H43813 Vitreous degeneration, bilateral: Secondary | ICD-10-CM | POA: Diagnosis not present

## 2022-02-04 DIAGNOSIS — H348312 Tributary (branch) retinal vein occlusion, right eye, stable: Secondary | ICD-10-CM | POA: Diagnosis not present

## 2022-02-04 DIAGNOSIS — H34832 Tributary (branch) retinal vein occlusion, left eye, with macular edema: Secondary | ICD-10-CM | POA: Diagnosis not present

## 2022-02-04 DIAGNOSIS — H35373 Puckering of macula, bilateral: Secondary | ICD-10-CM | POA: Diagnosis not present

## 2022-03-10 ENCOUNTER — Telehealth: Payer: Self-pay | Admitting: Family Medicine

## 2022-03-10 NOTE — Telephone Encounter (Signed)
Handicap Placard form to be filled out--placed in dr's folder.  Call [phone] upon completion.  ?

## 2022-03-11 NOTE — Telephone Encounter (Signed)
Form completed and returned.

## 2022-03-11 NOTE — Telephone Encounter (Signed)
Spoke with the patient's daughter and informed her the placard was left at the front desk for pick up.   ?

## 2022-04-01 ENCOUNTER — Telehealth: Payer: Self-pay | Admitting: Pharmacist

## 2022-04-01 NOTE — Chronic Care Management (AMB) (Signed)
Chronic Care Management Pharmacy Assistant   Name: Alison Cordova  MRN: [id number] DOB: Mar 29, 1926  Reason for Encounter: Disease State / Hypertension Assessment Call   Conditions to be addressed/monitored: HTN  Recent office visits:  01/14/2022 Micheline Rough MD - Patient was seen for Need for pneumococcal vaccination and additional issues. No medication changes. Follow up in 6 months.   Recent consult visits:  01/21/2022 Gardiner Barefoot DPM (podiatry) - Patient was seen for Pain due to onychomycosis of toenails of both feet and additional issues. No medication changes. Follow up in 4 months.   Hospital visits:  None  Medications: Outpatient Encounter Medications as of 04/01/2022  Medication Sig   acetaminophen (TYLENOL) 650 MG CR tablet Take 650 mg by mouth every 8 (eight) hours as needed.   amLODipine (NORVASC) 5 MG tablet Take 1 tablet (5 mg total) by mouth daily.   aspirin 325 MG tablet Take 325 mg by mouth daily.   atenolol-chlorthalidone (TENORETIC) 50-25 MG tablet Take 1 tablet by mouth daily. (Patient taking differently: Take 1 tablet by mouth every morning.)   cetirizine (ZYRTEC) 10 MG tablet Take 1 tablet (10 mg total) by mouth daily.   fluticasone (FLONASE) 50 MCG/ACT nasal spray Place 1 spray into both nostrils in the morning and at bedtime.   ipratropium (ATROVENT) 0.03 % nasal spray Place 2 sprays into both nostrils every 12 (twelve) hours.   latanoprost (XALATAN) 0.005 % ophthalmic solution Place 1 drop into both eyes at bedtime.   levothyroxine (SYNTHROID) 50 MCG tablet Take 1 tablet (50 mcg total) by mouth daily.   lisinopril (ZESTRIL) 20 MG tablet TAKE 1 TABLET BY MOUTH EVERY DAY (Patient taking differently: at bedtime.)   mirabegron ER (MYRBETRIQ) 50 MG TB24 tablet Take 50 mg by mouth 3 (three) times a week.   Multiple Vitamins-Minerals (PRESERVISION AREDS PO) Take by mouth daily.   Multiple Vitamins-Minerals (WOMENS MULTIVITAMIN PLUS PO) Take 1 tablet by mouth  daily.   NON FORMULARY Post Surgical Bras   NON FORMULARY Non-Silicone Breast Prosthesis   simvastatin (ZOCOR) 20 MG tablet TAKE 1 TABLET BY MOUTH EVERYDAY AT BEDTIME   timolol (BETIMOL) 0.5 % ophthalmic solution Place 1 drop into both eyes 2 (two) times daily.   tobramycin (TOBREX) 0.3 % ophthalmic ointment Place 1 application into the left eye as needed.   No facility-administered encounter medications on file as of 04/01/2022.  Fill History: ATENOLOL-CHLORTHALIDONE 50-25 MG TABLET 01/05/2022 90   LEVOTHYROXINE 50 MCG TABLET 03/30/2022 90   LISINOPRIL 20 MG TABLET 11/22/2021 90   SIMVASTATIN 20 MG TABLET 01/12/2022 90   AMLODIPINE 5 MG TABLET 01/05/2022 90   Reviewed chart prior to disease state call. Spoke with patient regarding BP  Recent Office Vitals: BP Readings from Last 3 Encounters:  01/14/22 (!) 150/68  12/30/21 124/68  07/14/21 130/60   Pulse Readings from Last 3 Encounters:  01/14/22 (!) 57  12/30/21 (!) 58  07/14/21 (!) 57    Wt Readings from Last 3 Encounters:  01/14/22 107 lb 3.2 oz (48.6 kg)  12/30/21 106 lb (48.1 kg)  07/14/21 106 lb 9.6 oz (48.4 kg)     Kidney Function Lab Results  Component Value Date/Time   CREATININE 1.45 (H) 01/14/2022 10:37 AM   CREATININE 1.40 (H) 07/14/2021 01:24 PM   CREATININE 1.7 (H) 12/19/2012 08:17 AM   GFR 31.12 (L) 01/14/2022 10:37 AM   GFRNONAA 42 (L) 06/09/2010 09:00 AM   GFRAA (L) 06/09/2010 09:00 AM  51        The eGFR has been calculated using the MDRD equation. This calculation has not been validated in all clinical situations. eGFR's persistently <60 mL/min signify possible Chronic Kidney Disease.       Latest Ref Rng & Units 01/14/2022   10:37 AM 07/14/2021    1:24 PM 02/14/2020   10:23 AM  BMP  Glucose 70 - 99 mg/dL 84   97   65    BUN 6 - 23 mg/dL $Remove'26   23   30    'IfGdzdJ$ Creatinine 0.40 - 1.20 mg/dL 1.45   1.40   1.58    Sodium 135 - 145 mEq/L 140   141   140    Potassium 3.5 - 5.1 mEq/L 4.5   4.1    4.1    Chloride 96 - 112 mEq/L 104   101   103    CO2 19 - 32 mEq/L $Remove'29   27   30    'gbUzSZm$ Calcium 8.4 - 10.5 mg/dL 10.4   9.9   10.5      Current antihypertensive regimen:  Amlodipine 5 mg 1 tablet daily Tenoretic 50/25 1 tablet daily Lisinopril 20 mg 1 tablet daily  How often are you checking your Blood Pressure? Patients daughter states she is checking about once weekly, patient gives her a hard time about doing her blood pressure.   Current home BP readings: Patients daughter states the last readings were 124/68, 128/60 and 130/68.  What recent interventions/DTPs have been made by any provider to improve Blood Pressure control since last CPP Visit: No recent interventions.   Any recent hospitalizations or ED visits since last visit with CPP? No recent hospital visits.   What diet changes have been made to improve Blood Pressure Control?  Patient is not following any specific diet Breakfast - patient will have rye bread with an egg and fruit Lunch - patient doesn't eat lunch due to eating breakfast late [place] - patient will have a meat and vegetable  Snack - patient will eat fruits  What exercise is being done to improve your Blood Pressure Control?  Patient will walk outside when it is warm outside and up and down the hallway other days. Walk the farmers market during the summer months twice a week.  Adherence Review: Is the patient currently on ACE/ARB medication? Yes Does the patient have >5 day gap between last estimated fill dates? Yes   Care Gaps: AWV - completed 12/30/2021 Last BP - 150/68 on 01/14/2022 Last A1C - 5.5 on 08/14/2019   Star Rating Drugs: Lisinopril 20 mg - last filled 11/22/2021 90 DS at [facility] verified with Kennyth Lose.   [facility]ed [facility] spoke with Lyanne Co, [facility] at [facility]/20[facility] 90 DS Simvastatin 20 mg - last filled 01/12/2022 90 [facility] at [facility]   Patients daughter states she will have Lisinopril filled at [facility] if she is too late calling in the refill  to mail order.   [place] Pharmacist Assistant [phone]

## 2022-04-16 DIAGNOSIS — R350 Frequency of micturition: Secondary | ICD-10-CM | POA: Diagnosis not present

## 2022-04-16 DIAGNOSIS — N3946 Mixed incontinence: Secondary | ICD-10-CM | POA: Diagnosis not present

## 2022-04-23 ENCOUNTER — Other Ambulatory Visit: Payer: Self-pay | Admitting: *Deleted

## 2022-04-23 MED ORDER — SIMVASTATIN 20 MG PO TABS: ORAL_TABLET | ORAL | 0 refills | Status: DC

## 2022-04-23 NOTE — Telephone Encounter (Signed)
Rx done. 

## 2022-05-12 DIAGNOSIS — H34832 Tributary (branch) retinal vein occlusion, left eye, with macular edema: Secondary | ICD-10-CM | POA: Diagnosis not present

## 2022-05-12 DIAGNOSIS — H43813 Vitreous degeneration, bilateral: Secondary | ICD-10-CM | POA: Diagnosis not present

## 2022-05-27 ENCOUNTER — Encounter: Payer: Self-pay | Admitting: Podiatry

## 2022-05-27 ENCOUNTER — Ambulatory Visit: Payer: PPO | Admitting: Podiatry

## 2022-05-27 DIAGNOSIS — B351 Tinea unguium: Secondary | ICD-10-CM | POA: Diagnosis not present

## 2022-05-27 DIAGNOSIS — M79675 Pain in left toe(s): Secondary | ICD-10-CM

## 2022-05-27 DIAGNOSIS — N182 Chronic kidney disease, stage 2 (mild): Secondary | ICD-10-CM | POA: Diagnosis not present

## 2022-05-27 DIAGNOSIS — M79674 Pain in right toe(s): Secondary | ICD-10-CM

## 2022-05-27 NOTE — Progress Notes (Signed)
This patient returns to my office for at risk foot care.  This patient requires this care by a professional since this patient will be at risk due to having CKD.   This patient presents to the office with her daughter.  This patient is unable to cut nails herself since the patient cannot reach her nails.These nails are painful walking and wearing shoes.  This patient presents for at risk foot care today.  General Appearance  Alert, conversant and in no acute stress.  Vascular  Dorsalis pedis and posterior tibial  pulses are  weakly palpable  bilaterally.  Capillary return is within normal limits  bilaterally. Cold feet bilaterally.  Neurologic  Senn-Weinstein monofilament wire test within normal limits  bilaterally. Muscle power within normal limits bilaterally.  Nails Thick disfigured discolored nails with subungual debris  hallux toenails bilaterally. No evidence of bacterial infection or drainage bilaterally.  Orthopedic  No limitations of motion  feet .  No crepitus or effusions noted.  No bony pathology or digital deformities noted.  Skin  normotropic skin with no porokeratosis noted bilaterally.  No signs of infections or ulcers noted.     Onychomycosis  Pain in right toes  Pain in left toes  Consent was obtained for treatment procedures.   Mechanical debridement of nails 1-5  bilaterally performed with a nail nipper.  Filed with dremel without incident.    Return office visit   4 months                   Told patient to return for periodic foot care and evaluation due to potential at risk complications.   Gardiner Barefoot DPM

## 2022-07-10 ENCOUNTER — Telehealth: Payer: Self-pay | Admitting: Family Medicine

## 2022-07-10 NOTE — Telephone Encounter (Signed)
Ok to refill 

## 2022-07-10 NOTE — Telephone Encounter (Signed)
Pt has TOC with dr Legrand Como on 08-11-2022 and would like refills on amLODipine (NORVASC) 5 MG tablet, atenolol-chlorthalidone (TENORETIC) 50-25 MG tablet, and simvastatin (ZOCOR) 20 MG tablet send to  [facility] ([place], [place] Phone:  [phone]  Fax:  [phone]

## 2022-07-20 ENCOUNTER — Telehealth: Payer: Self-pay | Admitting: Pharmacist

## 2022-07-20 NOTE — Chronic Care Management (AMB) (Signed)
    Chronic Care Management Pharmacy Assistant   Name: Alison Cordova  MRN: [id number] DOB: 06/21/1926  07/21/2022 APPOINTMENT REMINDER  Marijean Bravo Gist's daughter was reminded to have all medications, supplements and any blood glucose and blood pressure readings available for review with [name], Pharm. D, at her telephone visit on 07/21/2022 at 4:00.  Care Gaps: AWV - scheduled 12/31/2022 Last BP - 150/68 on 01/14/2022 Last A1C - 5.5 on 08/14/2019 Shingrix - never done Covid booster - overdue Flu - overdue Mammogram - postponed Tdap - postponed  Star Rating Drug: Lisinopril 20 mg - last filled 06/15/2022 90 DS at [facility] Simvastatin 20 mg - last filled 04/23/2022 90 DS at [facility]  Any gaps in medications fill history? No  Gennie Alma [facility]  Catering manager [phone]

## 2022-07-21 ENCOUNTER — Other Ambulatory Visit: Payer: Self-pay | Admitting: Family Medicine

## 2022-07-21 ENCOUNTER — Ambulatory Visit: Payer: PPO | Admitting: Pharmacist

## 2022-07-21 ENCOUNTER — Telehealth: Payer: Self-pay | Admitting: Pharmacist

## 2022-07-21 DIAGNOSIS — E785 Hyperlipidemia, unspecified: Secondary | ICD-10-CM

## 2022-07-21 DIAGNOSIS — I10 Essential (primary) hypertension: Secondary | ICD-10-CM

## 2022-07-21 NOTE — Telephone Encounter (Signed)
I would be happy to but I cannot find the [facility] in the computer--- the available strengths are only listed as tablets

## 2022-07-21 NOTE — Telephone Encounter (Signed)
Patient's daughter requested a refill for simvastatin to be sent to [facility] order pharmacy as patient will run out prior to the [facility] visit with Dr. Legrand Como. Routing to [facility] for the refill.

## 2022-07-21 NOTE — Patient Instructions (Signed)
Hi Alison Cordova and Alison Cordova,  It was great to catch up with you again!  Please reach out to me if you have any questions or need anything!  Best, Maddie  [name], PharmD, [place] Pharmacist [name]   Visit Information   Goals Addressed   None    Patient Care Plan: CCM Pharmacy Care Plan     Problem Identified: Problem: Hypertension, Hyperlipidemia, Hypothyroidism, Overactive Bladder, and Allergic Rhinitis      Long-Range Goal: Patient-Specific Goal   Start Date: 10/07/2021  Expected End Date: 10/07/2022  Recent Progress: On track  Priority: High  Note:   Current Barriers:  Unable to independently monitor therapeutic efficacy Unable to maintain control of blood pressure  Pharmacist Clinical Goal(s):  Patient will achieve adherence to monitoring guidelines and medication adherence to achieve therapeutic efficacy maintain control of blood pressure as evidenced by home blood pressure readings  through collaboration with PharmD and provider.   Interventions: 1:1 collaboration with Caren Macadam, MD regarding development and update of comprehensive plan of care as evidenced by provider attestation and co-signature Inter-disciplinary care team collaboration (see longitudinal plan of care) Comprehensive medication review performed; medication list updated in electronic medical record  Hypertension (BP goal <140/90) -Controlled -Current treatment: Amlodipine 5 mg 1 tablet daily - PM - Appropriate, Effective, Safe, Accessible Atenolol-chlorthalidone 50-25 mg 1 tablet daily - AM - Appropriate, Effective, Safe, Accessible Lisinopril 20 mg 1 tablet daily - PM - Appropriate, Effective, Safe, Accessible -Medications previously tried: none  -Current home readings: 136/60 HR 60, 127/61, 121/61 (2pm), 136/72 (2pm),  HR 64, 152/66 HR 66; uses her cuff with husband and daughter (trying to do it once weekly, pt doesn't want to take  clothes - uses arm cuff) -Current dietary habits: does eat a lot of salt - daughter tries to curb her; uses not a salt substitute  -Current exercise habits: none -Denies hypotensive/hypertensive symptoms -Educated on BP goals and benefits of medications for prevention of heart attack, stroke and kidney damage; Importance of home blood pressure monitoring; Proper BP monitoring technique; -Counseled to monitor BP at home weekly, document, and provide log at future appointments -Counseled on diet and exercise extensively Recommended to continue current medication  Hyperlipidemia: (LDL goal < 70) -Not ideally controlled -Current treatment: Simvastatin 20 mg 1 tablet at bedtime - Appropriate, Query effective, Safe, Accessible -Medications previously tried: none  -Current dietary patterns: uses vegetable oil  -Current exercise habits: none -Educated on Cholesterol goals;  Benefits of statin for ASCVD risk reduction; Importance of limiting foods high in cholesterol; -Counseled on diet and exercise extensively Recommended to continue current medication Recommended olive oil.  Cerebrovascular disease (Goal: prevent events) -Not ideally controlled -Current treatment  Simvastatin 20 mg 1 tablet at bedtime - Appropriate, Effective, Safe, Accessible Aspirin 325 mg 1 tablet daily - Appropriate, Effective, Query Safe, Accessible -Medications previously tried: none  -Counseled on risk vs benefits with high dose aspirin.  Allergic rhinitis (Goal: minimize symptoms) -Controlled -Current treatment  Cetirizine 10 mg 1 tablet daily - Appropriate, Effective, Safe, Accessible Ipratropium nasal spray 0.03% 2 sprays in both nostrils every 12 hours - Appropriate, Effective, Safe, Accessible Flonase 1 spray twice daily - not taking with ipratropium - Appropriate, Effective, Safe, Accessible -Medications previously tried: none  -Recommended to continue current medication  Hypothyroidism (Goal:  0.35-4.5) -Controlled -Current treatment  Levothyroxine 50 mcg 1 tablet daily - Appropriate, Effective, Safe, Accessible -Medications previously tried: none  -Counseled on taking the medication 30  minutes prior to eating and with water.  Overactive bladder (Goal: minimize symptoms) -Controlled -Current treatment  Mirabegron 50 mg 1 tablet three times weekly - Appropriate, Effective, Safe, Accessible -Medications previously tried: none  - Patient is getting samples from urologist.  Glaucoma (Goal: lower intraocular pressure) -Controlled -Current treatment  Latanoprost 0.005% 1 drop in both eyes at bedtime -Appropriate, Effective, Safe, Accessible Timolol 0.5% 1 drop in both eyes twice daily - Appropriate, Effective, Safe, Accessible Tobramycin 0.3% ointment in left eye as needed for eye injections - Appropriate, Effective, Safe, Accessible -Medications previously tried: none  -Recommended to continue current medication   Health Maintenance -Vaccine gaps: shingrix, tetanus -Current therapy:  Multivitamin 1 tablet daily Acetaminophen 650 mg 1 tablet as needed Preservision areds 1 capsule twice daily -Educated on Cost vs benefit of each product must be carefully weighed by individual consumer -Patient is satisfied with current therapy and denies issues -Recommended to continue current medication  Patient Goals/Self-Care Activities Patient will:  - take medications as prescribed as evidenced by patient report and record review check blood pressure weekly, document, and provide at future appointments  Follow Up Plan: The care management team will reach out to the patient again over the next 90 days.        Patient verbalizes understanding of instructions and care plan provided today and agrees to view in [place]. Active MyChart status and patient understanding of how to access instructions and care plan via MyChart confirmed with patient.    The pharmacy team will reach out to  the patient again over the next 90 days.   [name], [facility]

## 2022-07-21 NOTE — Progress Notes (Signed)
Chronic Care Management Pharmacy Note  07/22/2022 Name:  Alison Cordova MRN:  [id number] DOB:  1926-10-24  Summary: BP mostly at goal < 140/90 per office readings Pt requested refill for simvastatin  Recommendations/Changes made from today's visit: -Recommend decreasing aspirin to 81 mg daily for secondary prevention -Recommended continued weekly BP monitoring at home  Plan: Follow up BP assessment in 3-4 months Follow up as needed  Subjective: Alison Cordova is an 88 y.o. year old female who is a primary patient of Koberlein, Steele Berg, MD (Inactive).  The CCM team was consulted for assistance with disease management and care coordination needs.    Engaged with patient by telephone for follow up visit in response to provider referral for pharmacy case management and/or care coordination services.   Consent to Services:  The patient was given information about Chronic Care Management services, agreed to services, and gave verbal consent prior to initiation of services.  Please see initial visit note for detailed documentation.   Patient Care Team: Caren Macadam, MD (Inactive) as PCP - General (Family Medicine) Lafayette Dragon, MD (Inactive) (Gastroenterology) Heath Lark, MD as Consulting Physician (Hematology and Oncology) Bjorn Loser, MD as Consulting Physician (Urology) Sherlynn Stalls, MD as Consulting Physician (Ophthalmology) [name], [facility] as Pharmacist (Pharmacist)  Recent office visits: 01/14/2022 Micheline Rough MD - Patient was seen for Need for pneumococcal vaccination and additional issues. No medication changes. Follow up in 6 months.   Recent consult visits: 05/27/22 Gardiner Barefoot, DPM (podiatry): Patient presented for nail debridement.   04/16/22 Bjorn Loser (urology): Patient presented for mixed incontinence. Unable to access notes.  01/21/2022 Gardiner Barefoot DPM (podiatry) - Patient was seen for Pain due to onychomycosis of toenails of both  feet and additional issues. No medication changes. Follow up in 4 months.    Hospital visits: None in previous 6 months   Objective:  Lab Results  Component Value Date   CREATININE 1.45 (H) 01/14/2022   BUN 26 (H) 01/14/2022   GFR 31.12 (L) 01/14/2022   GFRNONAA 42 (L) 06/09/2010   GFRAA (L) 06/09/2010    51        The eGFR has been calculated using the MDRD equation. This calculation has not been validated in all clinical situations. eGFR's persistently <60 mL/min signify possible Chronic Kidney Disease.   NA 140 01/14/2022   K 4.5 01/14/2022   CALCIUM 10.4 01/14/2022   CO2 29 01/14/2022   GLUCOSE 84 01/14/2022    Lab Results  Component Value Date/Time   HGBA1C 5.5 08/14/2019 09:31 AM   HGBA1C 5.8 11/21/2018 09:43 AM   GFR 31.12 (L) 01/14/2022 10:37 AM   GFR 32.57 (L) 07/14/2021 01:24 PM    Last diabetic Eye exam: No results found for: "HMDIABEYEEXA"  Last diabetic Foot exam: No results found for: "HMDIABFOOTEX"   Lab Results  Component Value Date   CHOL 162 01/14/2022   HDL 56.20 01/14/2022   LDLCALC 83 01/14/2022   LDLDIRECT 115.9 01/04/2012   TRIG 114.0 01/14/2022   CHOLHDL 3 01/14/2022       Latest Ref Rng & Units 01/14/2022   10:37 AM 07/14/2021    1:24 PM 02/21/2014   10:41 AM  Hepatic Function  Total Protein 6.0 - 8.3 g/dL 8.1  7.6  7.9   Albumin 3.5 - 5.2 g/dL 4.6  4.3  4.8   AST 0 - 37 U/L $Remo'15  13  24   'AMXKe$ ALT 0 - 35 U/L 9  8  12   Alk Phosphatase 39 - 117 U/L 54  52  45   Total Bilirubin 0.2 - 1.2 mg/dL 0.6  0.6  0.6   Bilirubin, Direct 0.0 - 0.3 mg/dL   0.0     Lab Results  Component Value Date/Time   TSH 2.55 01/14/2022 10:37 AM   TSH 3.15 07/14/2021 01:24 PM       Latest Ref Rng & Units 01/14/2022   10:37 AM 07/14/2021    1:24 PM 02/14/2020   10:23 AM  CBC  WBC 4.0 - 10.5 K/uL 6.1  6.1  6.2   Hemoglobin 12.0 - 15.0 g/dL 12.9  12.6  13.0   Hematocrit 36.0 - 46.0 % 39.4  38.6  38.2   Platelets 150.0 - 400.0 K/uL 228.0  245.0  245.0      Lab Results  Component Value Date/Time   VD25OH 49.69 04/16/2015 10:26 AM   VD25OH 59 02/21/2013 09:49 AM   VD25OH 51 02/20/2009 05:23 PM    Clinical ASCVD: Yes  The ASCVD Risk score (Arnett DK, et al., 2019) failed to calculate for the following reasons:   The 2019 ASCVD risk score is only valid for ages 84 to 36       01/14/2022   10:39 AM 12/30/2021   11:29 AM 12/25/2020   11:54 AM  Depression screen PHQ 2/9  Decreased Interest 1 0 0  Down, Depressed, Hopeless 0 0 0  PHQ - 2 Score 1 0 0  Altered sleeping 1    Tired, decreased energy 1    Change in appetite 1    Feeling bad or failure about yourself  0    Trouble concentrating 0    Moving slowly or fidgety/restless 1    Suicidal thoughts 1    PHQ-9 Score 6        Social History   Tobacco Use  Smoking Status Former   Types: Cigarettes   Quit date: 11/23/1962   Years since quitting: 59.7  Smokeless Tobacco Never   BP Readings from Last 3 Encounters:  01/14/22 (!) 150/68  12/30/21 124/68  07/14/21 130/60   Pulse Readings from Last 3 Encounters:  01/14/22 (!) 57  12/30/21 (!) 58  07/14/21 (!) 57   Wt Readings from Last 3 Encounters:  01/14/22 107 lb 3.2 oz (48.6 kg)  12/30/21 106 lb (48.1 kg)  07/14/21 106 lb 9.6 oz (48.4 kg)   BMI Readings from Last 3 Encounters:  01/14/22 20.26 kg/m  12/30/21 20.03 kg/m  07/14/21 20.82 kg/m    Assessment/Interventions: Review of patient past medical history, allergies, medications, health status, including review of consultants reports, laboratory and other test data, was performed as part of comprehensive evaluation and provision of chronic care management services.   SDOH:  (Social Determinants of Health) assessments and interventions performed: Yes SDOH Interventions    Flowsheet Row Most Recent Value  SDOH Interventions   Financial Strain Interventions Intervention Not Indicated       SDOH Screenings   Alcohol Screen: Low Risk  (12/30/2021)   Alcohol  Screen    Last Alcohol Screening Score (AUDIT): 0  Depression (PHQ2-9): Medium Risk (01/14/2022)   Depression (PHQ2-9)    PHQ-2 Score: 6  Financial Resource Strain: Low Risk  (07/21/2022)   Overall Financial Resource Strain (CARDIA)    Difficulty of Paying Living Expenses: Not hard at all  Food Insecurity: No Food Insecurity (12/30/2021)   Hunger Vital Sign    Worried About Running Out  of Food in the Last Year: Never true    [address] in the Last Year: Never true  Housing: Low Risk  (12/30/2021)   Housing    Last Housing Risk Score: 0  Physical Activity: Inactive (12/30/2021)   Exercise Vital Sign    Days of Exercise per Week: 0 days    Minutes of Exercise per Session: 0 min  Social Connections: Socially Isolated (12/30/2021)   Social Connection and Isolation Panel [NHANES]    Frequency of Communication with Friends and Family: More than three times a week    Frequency of Social Gatherings with Friends and Family: More than three times a week    Attends Religious Services: Never    Marine scientist or Organizations: No    Attends Archivist Meetings: Never    Marital Status: Widowed  Stress: No Stress Concern Present (12/30/2021)   [place]    Feeling of Stress : Not at all  Tobacco Use: Medium Risk (05/27/2022)   Patient History    Smoking Tobacco Use: Former    Smokeless Tobacco Use: Never    Passive Exposure: Not on file  Transportation Needs: No Transportation Needs (12/30/2021)   PRAPARE - Hydrologist (Medical): No    Lack of Transportation (Non-Medical): No      CCM Care Plan  No Known Allergies  Medications Reviewed Today     Reviewed by [name], [facility] (Pharmacist) on 07/21/22 at 1613  Med List Status: <None>   Medication Order Taking? Sig Documenting Provider Last Dose Status Informant  acetaminophen (TYLENOL) 650 MG CR tablet [id number] Yes Take 650  mg by mouth every 8 (eight) hours as needed. [provider] Taking Active   amLODipine (NORVASC) 5 MG tablet [id number] Yes Take 1 tablet (5 mg total) by mouth daily. Caren Macadam, MD Taking Active   aspirin 325 MG tablet [id number]  Take 325 mg by mouth daily. [provider]  Active   atenolol-chlorthalidone (TENORETIC) 50-25 MG tablet [id number]  Take 1 tablet by mouth daily.  Patient taking differently: Take 1 tablet by mouth every morning.   Caren Macadam, MD  Active   cetirizine (ZYRTEC) 10 MG tablet [id number] Yes Take 1 tablet (10 mg total) by mouth daily. Noralee Space, MD Taking Active   fluticasone [facility]) 50 MCG/ACT nasal spray [id number] Yes Place 1 spray into both nostrils in the morning and at bedtime. [provider] Taking Active   ipratropium (ATROVENT) 0.03 % nasal spray [id number] Yes Place 2 sprays into both nostrils every 12 (twelve) hours. Caren Macadam, MD Taking Active   latanoprost (XALATAN) 0.005 % ophthalmic solution [id number] Yes Place 1 drop into both eyes at bedtime. [provider] Taking Active   levothyroxine (SYNTHROID) 50 MCG tablet [id number] Yes Take 1 tablet (50 mcg total) by mouth daily. Caren Macadam, MD Taking Active   lisinopril (ZESTRIL) 20 MG tablet [id number] Yes TAKE 1 TABLET BY MOUTH EVERY DAY  Patient taking differently: at bedtime.   Caren Macadam, MD Taking Active   mirabegron ER (MYRBETRIQ) 50 MG TB24 tablet [id number] Yes Take 50 mg by mouth 3 (three) times a week. [provider] Taking Active   Multiple Vitamins-Minerals (PRESERVISION AREDS PO) [id number] Yes Take by mouth daily. [provider] Taking Active   Multiple Vitamins-Minerals (WOMENS MULTIVITAMIN PLUS PO) [id number] Yes Take 1 tablet by mouth daily.  [provider] Taking Active   Baruch Gouty [id number]  [place] [provider]  Active   Baruch Gouty [id number]  Non-Silicone Breast  Prosthesis [provider]  Active   simvastatin (ZOCOR) 20 MG tablet [id number] Yes TAKE 1 TABLET BY MOUTH EVERYDAY AT BEDTIME Koberlein, Junell C, MD Taking Active   timolol (BETIMOL) 0.5 % ophthalmic solution [id number] Yes Place 1 drop into both eyes 2 (two) times daily. [provider] Taking Active   tobramycin (TOBREX) 0.3 % ophthalmic ointment [id number] Yes Place 1 application into the left eye as needed. [provider] Taking Active             Patient Active Problem List   Diagnosis Date Noted   Hyperlipemia 03/27/2016   Anemia 06/17/2012   Chronic kidney disease (CKD), stage II (mild) 06/17/2012   Cerebrovascular disease 06/27/2010   Malignant neoplasm of female breast ([place]) 02/19/2010   Glaucoma 09/02/2008   Hypothyroidism 03/14/2008   Allergic rhinitis 03/14/2008   Urinary incontinence 03/14/2008   Essential hypertension 09/17/2007   GERD 09/17/2007    Immunization History  Administered Date(s) Administered   Fluad Quad(high Dose 65+) 07/27/2019   Influenza Split 08/13/2011, 08/26/2012   Influenza Whole 08/21/2008, 08/21/2009   Influenza, High Dose Seasonal PF 08/19/2015, 07/30/2016   Influenza,inj,Quad PF,6+ Mos 08/27/2014, 08/20/2017, 08/15/2018   Influenza-Unspecified 08/23/2013, 08/16/2020, 09/08/2021   PFIZER(Purple Top)SARS-COV-2 Vaccination 01/06/2020, 01/29/2020, 09/11/2020   PNEUMOCOCCAL CONJUGATE-20 01/14/2022   Pneumococcal Conjugate-13 08/27/2014   Pneumococcal Polysaccharide-23 09/26/2015   Td 02/19/2010   Zoster, Live 03/28/2015   Patient has a pain in legs and thinks its arthritis and Tylenol seems to help with it.   Patient did not decrease her dose of aspirin 81 mg.  Patient was in a study and they put in her on the high dose of aspirin and that was > 10 years ago. She did have a stroke at the time as well.  Conditions to be addressed/monitored:  Hypertension, Hyperlipidemia, Hypothyroidism, Overactive Bladder, and  Allergic Rhinitis  Conditions addressed this visit: Hypertension, hyperlipidemia  Care Plan : [place]  Updates made by [name], [place] since 07/22/2022 12:00 AM     Problem: Problem: Hypertension, Hyperlipidemia, Hypothyroidism, Overactive Bladder, and Allergic Rhinitis      Long-Range Goal: Patient-Specific Goal   Start Date: 10/07/2021  Expected End Date: 10/07/2022  Recent Progress: On track  Priority: High  Note:   Current Barriers:  Unable to independently monitor therapeutic efficacy Unable to maintain control of blood pressure  Pharmacist Clinical Goal(s):  Patient will achieve adherence to monitoring guidelines and medication adherence to achieve therapeutic efficacy maintain control of blood pressure as evidenced by home blood pressure readings  through collaboration with PharmD and provider.   Interventions: 1:1 collaboration with Caren Macadam, MD regarding development and update of comprehensive plan of care as evidenced by provider attestation and co-signature Inter-disciplinary care team collaboration (see longitudinal plan of care) Comprehensive medication review performed; medication list updated in electronic medical record  Hypertension (BP goal <140/90) -Controlled -Current treatment: Amlodipine 5 mg 1 tablet daily - PM - Appropriate, Effective, Safe, Accessible Atenolol-chlorthalidone 50-25 mg 1 tablet daily - AM - Appropriate, Effective, Safe, Accessible Lisinopril 20 mg 1 tablet daily - PM - Appropriate, Effective, Safe, Accessible -Medications previously tried: none  -Current home readings: 136/60 HR 60, 127/61, 121/61 (2pm), 136/72 (2pm),  HR 64, 152/66 HR 66; uses her cuff with husband and daughter (trying to do  it once weekly, pt doesn't want to take clothes - uses arm cuff) -Current dietary habits: does eat a lot of salt - daughter tries to curb her; uses not a salt substitute  -Current exercise habits: none -Denies  hypotensive/hypertensive symptoms -Educated on BP goals and benefits of medications for prevention of heart attack, stroke and kidney damage; Importance of home blood pressure monitoring; Proper BP monitoring technique; -Counseled to monitor BP at home weekly, document, and provide log at future appointments -Counseled on diet and exercise extensively Recommended to continue current medication  Hyperlipidemia: (LDL goal < 70) -Not ideally controlled -Current treatment: Simvastatin 20 mg 1 tablet at bedtime - Appropriate, Query effective, Safe, Accessible -Medications previously tried: none  -Current dietary patterns: uses vegetable oil  -Current exercise habits: none -Educated on Cholesterol goals;  Benefits of statin for ASCVD risk reduction; Importance of limiting foods high in cholesterol; -Counseled on diet and exercise extensively Recommended to continue current medication Recommended olive oil.  Cerebrovascular disease (Goal: prevent events) -Not ideally controlled -Current treatment  Simvastatin 20 mg 1 tablet at bedtime - Appropriate, Effective, Safe, Accessible Aspirin 325 mg 1 tablet daily - Appropriate, Effective, Query Safe, Accessible -Medications previously tried: none  -Counseled on risk vs benefits with high dose aspirin.  Allergic rhinitis (Goal: minimize symptoms) -Controlled -Current treatment  Cetirizine 10 mg 1 tablet daily - Appropriate, Effective, Safe, Accessible Ipratropium nasal spray 0.03% 2 sprays in both nostrils every 12 hours - Appropriate, Effective, Safe, Accessible Flonase 1 spray twice daily - not taking with ipratropium - Appropriate, Effective, Safe, Accessible -Medications previously tried: none  -Recommended to continue current medication  Hypothyroidism (Goal: 0.35-4.5) -Controlled -Current treatment  Levothyroxine 50 mcg 1 tablet daily - Appropriate, Effective, Safe, Accessible -Medications previously tried: none  -Counseled on  taking the medication 30 minutes prior to eating and with water.  Overactive bladder (Goal: minimize symptoms) -Controlled -Current treatment  Mirabegron 50 mg 1 tablet three times weekly - Appropriate, Effective, Safe, Accessible -Medications previously tried: none  - Patient is getting samples from urologist.  Glaucoma (Goal: lower intraocular pressure) -Controlled -Current treatment  Latanoprost 0.005% 1 drop in both eyes at bedtime -Appropriate, Effective, Safe, Accessible Timolol 0.5% 1 drop in both eyes twice daily - Appropriate, Effective, Safe, Accessible Tobramycin 0.3% ointment in left eye as needed for eye injections - Appropriate, Effective, Safe, Accessible -Medications previously tried: none  -Recommended to continue current medication   Health Maintenance -Vaccine gaps: shingrix, tetanus -Current therapy:  Multivitamin 1 tablet daily Acetaminophen 650 mg 1 tablet as needed Preservision areds 1 capsule twice daily -Educated on Cost vs benefit of each product must be carefully weighed by individual consumer -Patient is satisfied with current therapy and denies issues -Recommended to continue current medication  Patient Goals/Self-Care Activities Patient will:  - take medications as prescribed as evidenced by patient report and record review check blood pressure weekly, document, and provide at future appointments  Follow Up Plan: The care management team will reach out to the patient again over the next 120 days.          Medication Assistance: None required.  Patient affirms current coverage meets needs.  Compliance/Adherence/Medication fill history: Care Gaps: Shingrix, tetanus, COVID booster, mammogram, influenza Last BP - 150/68 on 01/14/2022 Last A1C - 5.5 on 08/14/2019  Star-Rating Drugs: Lisinopril 20 mg - last filled 06/15/2022 90 DS at [facility] Simvastatin 20 mg - last filled 04/23/2022 90 DS at [facility]  Patient's preferred pharmacy is:  [facility]  Psychologist, clinical [facility]) - [address], [place] [address] Phone: [phone] Fax: [phone]   Uses pill box? Yes - 7 days Pt endorses 95% compliance - misses supper time sometimes  We discussed: Benefits of medication synchronization, packaging and delivery as well as enhanced pharmacist oversight with [facility]. Patient decided to: Continue current medication management strategy  Care Plan and Follow Up Patient Decision:  Patient agrees to Care Plan and Follow-up.  Plan: The care management team will reach out to the patient again over the next 90 days.  [name], PharmD, [place] Pharmacist [place] [facility] [place]

## 2022-07-22 ENCOUNTER — Other Ambulatory Visit: Payer: Self-pay | Admitting: Family Medicine

## 2022-07-22 DIAGNOSIS — E785 Hyperlipidemia, unspecified: Secondary | ICD-10-CM

## 2022-07-22 MED ORDER — SIMVASTATIN 20 MG PO TABS: ORAL_TABLET | ORAL | 3 refills | Status: DC

## 2022-07-22 NOTE — Telephone Encounter (Signed)
Oh ok! Rx sent

## 2022-07-30 ENCOUNTER — Telehealth: Payer: Self-pay | Admitting: Pharmacist

## 2022-07-30 NOTE — Telephone Encounter (Signed)
-----   Message from Farrel Conners, MD sent at 07/29/2022 11:41 AM EDT ----- Yes there is no need for a 325 mg dose, please have her decrease to 81 mg baby aspirin. Thanks! ----- Message ----- From: [name], [facility] Sent: 07/22/2022  10:24 AM EDT To: Farrel Conners, MD  Hi,  I know you don't see her for the first time until Sept 19th but I think it's worth discussing decreasing her dose of aspirin. She is currently on 325 mg but I don't really see a reason to continue on this dose. She did have a stroke according to her daughter > 10 years ago and at this point if it's just for secondary prevention, 81 mg is just as effective for a 1 time event. Plus given her age, I think she is at a much higher risk for bleeding with the 325 mg dose. I told her daughter that I would give you a heads up that we discussed it as well.  Best, Maddie

## 2022-07-30 NOTE — Telephone Encounter (Signed)
Called patient's daughter about changing aspirin to 81 mg daily per discussion with new PCP. Patient's daughter will purchase it and then switch her to the low dose. Patient's daughter is hoping she will bruise less often with the lower dose. Adjusted her medication list to reflect the change.

## 2022-08-11 ENCOUNTER — Encounter: Payer: Self-pay | Admitting: Family Medicine

## 2022-08-11 ENCOUNTER — Ambulatory Visit (INDEPENDENT_AMBULATORY_CARE_PROVIDER_SITE_OTHER): Payer: PPO | Admitting: Family Medicine

## 2022-08-11 VITALS — BP 140/60 | HR 60 | Temp 97.4°F | Ht 61.0 in | Wt 102.8 lb

## 2022-08-11 DIAGNOSIS — Z681 Body mass index (BMI) 19 or less, adult: Secondary | ICD-10-CM

## 2022-08-11 DIAGNOSIS — N1832 Chronic kidney disease, stage 3b: Secondary | ICD-10-CM | POA: Diagnosis not present

## 2022-08-11 DIAGNOSIS — E039 Hypothyroidism, unspecified: Secondary | ICD-10-CM

## 2022-08-11 DIAGNOSIS — I10 Essential (primary) hypertension: Secondary | ICD-10-CM

## 2022-08-11 MED ORDER — AMLODIPINE BESYLATE 5 MG PO TABS: 5.0000 mg | ORAL_TABLET | Freq: Every day | ORAL | 3 refills | Status: DC

## 2022-08-11 MED ORDER — ATENOLOL-CHLORTHALIDONE 50-25 MG PO TABS: 1.0000 | ORAL_TABLET | Freq: Every morning | ORAL | 3 refills | Status: DC

## 2022-08-11 MED ORDER — LEVOTHYROXINE SODIUM 50 MCG PO TABS: 50.0000 ug | ORAL_TABLET | Freq: Every day | ORAL | 3 refills | Status: DC

## 2022-08-11 MED ORDER — LISINOPRIL 40 MG PO TABS: 40.0000 mg | ORAL_TABLET | Freq: Every day | ORAL | 1 refills | Status: DC

## 2022-08-11 NOTE — Assessment & Plan Note (Signed)
Current hypertension medications:      Sig   amLODipine (NORVASC) 5 MG tablet Take 1 tablet (5 mg total) by mouth daily.   atenolol-chlorthalidone (TENORETIC) 50-25 MG tablet Take 1 tablet by mouth every morning.   lisinopril (ZESTRIL) 40 MG tablet Take 1 tablet (40 mg total) by mouth at bedtime.    BP better but still elevated I the visit, Will increase her lisinopril to 40 mg at bedtime. Daughter will continue to check her BP at home and send me the readings on MyChart. RTC in 6 months

## 2022-08-11 NOTE — Progress Notes (Signed)
Established Patient Office Visit  Subjective   Patient ID: Alison Cordova, female    DOB: January 04, 1926  Age: 88 y.o. MRN: [id number]  Chief Complaint  Patient presents with   Establish Care    Patient is here with her daughter. Daughter reports that her BP remains elevated. The pharmacist recommended that she switch her medications to the evening time. Is taking the atenolol/chlorthalidone in the morning, and the lisinopril and amlodipine at nighttime. Daughter notices that the top number is higher than normal with his regimen. We reviewed her previous BP's and I recommended we increase her lisinopril to 40 mg daily.   Daughter is reporting that she is having decreased appetite at home, states that she is eating more sugary foods. Daughter states for breakfast she eats a piece of toast and an egg  with juice in the mornings. She has lost about 4 pounds since her last visit 6 months ago. We discussed ways to help with this problem including adding supplement shakes, Patient is very [place] even with her hearing aids and so the daughter is giving the history. Patient herself has no specific complaints today.  I reviewed her current problem list, medications, HM measures as well. Last set of labs also reviewed, her CKD stage 3 is stable from previous values, pt denies any significant swelling today.   Current Outpatient Medications  Medication Instructions   acetaminophen (TYLENOL) 650 mg, Oral, Every 8 hours PRN,     amLODipine (NORVASC) 5 mg, Oral, Daily   aspirin EC 81 mg, Oral, Daily,     atenolol-chlorthalidone (TENORETIC) 50-25 MG tablet 1 tablet, Oral, Every morning   cetirizine (ZYRTEC) 10 mg, Oral, Daily   fluticasone (FLONASE) 50 MCG/ACT nasal spray 1 spray, Each Nare, 2 times daily   ipratropium (ATROVENT) 0.03 % nasal spray 2 sprays, Each Nare, Every 12 hours   latanoprost (XALATAN) 0.005 % ophthalmic solution 1 drop, Both Eyes, Daily at bedtime,     levothyroxine (SYNTHROID) 50 mcg,  Oral, Daily   lisinopril (ZESTRIL) 40 mg, Oral, Daily at bedtime   mirabegron ER (MYRBETRIQ) 50 mg, Oral, 3 times weekly   Multiple Vitamins-Minerals (PRESERVISION AREDS PO) Oral, Daily   Multiple Vitamins-Minerals (WOMENS MULTIVITAMIN PLUS PO) 1 tablet, Oral, Daily,     NON FORMULARY Post Surgical Bras    NON FORMULARY Non-Silicone Breast Prosthesis    simvastatin (ZOCOR) 20 MG tablet TAKE 1 TABLET BY MOUTH EVERYDAY AT BEDTIME   timolol (BETIMOL) 0.5 % ophthalmic solution 1 drop, 2 times daily   tobramycin (TOBREX) 0.3 % ophthalmic ointment 1 application , As needed    Patient Active Problem List   Diagnosis Date Noted   Body mass index (BMI) of 19.0-19.9 in adult 08/14/2022   Hyperlipemia 03/27/2016   Anemia 06/17/2012   CKD (chronic kidney disease) stage 3, GFR 30-59 ml/min (HCC) 06/17/2012   Cerebrovascular disease 06/27/2010   Malignant neoplasm of female breast ([place]) 02/19/2010   Glaucoma 09/02/2008   Hypothyroidism 03/14/2008   Allergic rhinitis 03/14/2008   Urinary incontinence 03/14/2008   Essential hypertension 09/17/2007   GERD 09/17/2007      Review of Systems  All other systems reviewed and are negative.     Objective:     BP (!) 140/60 (BP Location: Right Arm, Cuff Size: Normal)   Pulse 60   Temp (!) 97.4 F (36.3 C) (Oral)   Ht '5\' 1"'$  (1.549 m)   Wt 102 lb 12.8 oz (46.6 kg)   SpO2 94%  BMI 19.42 kg/m  BP Readings from Last 3 Encounters:  08/11/22 (!) 140/60  01/14/22 (!) 150/68  12/30/21 124/68      Physical Exam Vitals reviewed.  Constitutional:      Appearance: Normal appearance. She is well-groomed and underweight.  HENT:     Head: Normocephalic and atraumatic.  Eyes:     Extraocular Movements: Extraocular movements intact.     Conjunctiva/sclera: Conjunctivae normal.  Neck:     Thyroid: No thyromegaly.  Cardiovascular:     Rate and Rhythm: Normal rate and regular rhythm.     Pulses: Normal pulses.     Heart sounds: S1 normal and S2  normal.  Pulmonary:     Effort: Pulmonary effort is normal.     Breath sounds: Normal breath sounds and air entry.  Abdominal:     General: Abdomen is flat. Bowel sounds are normal.     Palpations: Abdomen is soft.  Musculoskeletal:        General: Normal range of motion.     Right lower leg: No edema.     Left lower leg: No edema.  Neurological:     General: No focal deficit present.     Mental Status: She is alert. Mental status is at baseline.     Gait: Gait is intact.  Psychiatric:        Mood and Affect: Mood and affect normal.        Speech: Speech normal.        Behavior: Behavior normal. Behavior is cooperative.        Judgment: Judgment normal.      No results found for any visits on 08/11/22.  Last metabolic panel Lab Results  Component Value Date   GLUCOSE 84 01/14/2022   NA 140 01/14/2022   K 4.5 01/14/2022   CL 104 01/14/2022   CO2 29 01/14/2022   BUN 26 (H) 01/14/2022   CREATININE 1.45 (H) 01/14/2022   CALCIUM 10.4 01/14/2022   PROT 8.1 01/14/2022   ALBUMIN 4.6 01/14/2022   BILITOT 0.6 01/14/2022   ALKPHOS 54 01/14/2022   AST 15 01/14/2022   ALT 9 01/14/2022   Last lipids Lab Results  Component Value Date   CHOL 162 01/14/2022   HDL 56.20 01/14/2022   LDLCALC 83 01/14/2022   LDLDIRECT 115.9 01/04/2012   TRIG 114.0 01/14/2022   CHOLHDL 3 01/14/2022   Last thyroid functions Lab Results  Component Value Date   TSH 2.55 01/14/2022      The ASCVD Risk score (Arnett DK, et al., 2019) failed to calculate for the following reasons:   The 2019 ASCVD risk score is only valid for ages 39 to 30    Assessment & Plan:   Problem List Items Addressed This Visit       Cardiovascular and Mediastinum   Essential hypertension - Primary    Current hypertension medications:       Sig   amLODipine (NORVASC) 5 MG tablet Take 1 tablet (5 mg total) by mouth daily.   atenolol-chlorthalidone (TENORETIC) 50-25 MG tablet Take 1 tablet by mouth every  morning.   lisinopril (ZESTRIL) 40 MG tablet Take 1 tablet (40 mg total) by mouth at bedtime.  BP better but still elevated I the visit, Will increase her lisinopril to 40 mg at bedtime. Daughter will continue to check her BP at home and send me the readings on MyChart. RTC in 6 months       Relevant Medications   lisinopril (  ZESTRIL) 40 MG tablet   amLODipine (NORVASC) 5 MG tablet   atenolol-chlorthalidone (TENORETIC) 50-25 MG tablet     Endocrine   Hypothyroidism    On 50 mcg of levothyroxine daily, her last TSH was WNL, will recheck in 6 months. Continue this medication as prescribed.       Relevant Medications   levothyroxine (SYNTHROID) 50 MCG tablet     Genitourinary   CKD (chronic kidney disease) stage 3, GFR 30-59 ml/min (HCC) (Chronic)    Last Cr was stable from previous, pt denies any decrease in UOP, no increase in swelling, will continue to monitor CMP yearly. I encouraged increasing oral hydration if possible.        Other   Body mass index (BMI) of 19.0-19.9 in adult (Chronic)    Has lost about 4 pounds in the last 6 months, I encouraged increasing caloric intake and oral hydration. We briefly discussed the natural aging process and decreased appetite is usually a part of this. We will continue to monitor her weight every 6 months and if it continues to decline then we may decide if medication is needed at a later date.       Return in about 6 months (around 02/09/2023) for Annual physical exam.    Farrel Conners, MD

## 2022-08-11 NOTE — Assessment & Plan Note (Signed)
On 50 mcg of levothyroxine daily, her last TSH was WNL, will recheck in 6 months. Continue this medication as prescribed.

## 2022-08-12 DIAGNOSIS — R350 Frequency of micturition: Secondary | ICD-10-CM | POA: Diagnosis not present

## 2022-08-12 DIAGNOSIS — N3946 Mixed incontinence: Secondary | ICD-10-CM | POA: Diagnosis not present

## 2022-08-14 DIAGNOSIS — Z681 Body mass index (BMI) 19 or less, adult: Secondary | ICD-10-CM | POA: Insufficient documentation

## 2022-08-14 NOTE — Assessment & Plan Note (Signed)
Has lost about 4 pounds in the last 6 months, I encouraged increasing caloric intake and oral hydration. We briefly discussed the natural aging process and decreased appetite is usually a part of this. We will continue to monitor her weight every 6 months and if it continues to decline then we may decide if medication is needed at a later date.

## 2022-08-14 NOTE — Assessment & Plan Note (Signed)
Last Cr was stable from previous, pt denies any decrease in UOP, no increase in swelling, will continue to monitor CMP yearly. I encouraged increasing oral hydration if possible.

## 2022-08-18 DIAGNOSIS — H43813 Vitreous degeneration, bilateral: Secondary | ICD-10-CM | POA: Diagnosis not present

## 2022-08-18 DIAGNOSIS — H34832 Tributary (branch) retinal vein occlusion, left eye, with macular edema: Secondary | ICD-10-CM | POA: Diagnosis not present

## 2022-08-18 DIAGNOSIS — H3582 Retinal ischemia: Secondary | ICD-10-CM | POA: Diagnosis not present

## 2022-08-18 DIAGNOSIS — H348312 Tributary (branch) retinal vein occlusion, right eye, stable: Secondary | ICD-10-CM | POA: Diagnosis not present

## 2022-08-26 NOTE — Telephone Encounter (Signed)
Refills were previously sent by PCP on 8/30 and 9/19.

## 2022-08-27 ENCOUNTER — Telehealth: Payer: Self-pay

## 2022-08-27 NOTE — Telephone Encounter (Signed)
--  Caller states mother's BP 179/71, feels fine, no symptoms. Seen a few weeks ago, high then, increased Lisinopril pm dose. High in am, normal range in pm. 125/50 last night around 1840.   08/27/2022 11:44:54 AM SEE PCP WITHIN 3 DAYS Lenon Curt, RN, Melanie  Comments User: Erik Obey, RN Date/Time Eilene Ghazi Time): 08/27/2022 11:42:53 AM Dr. Maudie Mercury had her take BP meds in the am, did fine. Dr. office Pharmacist (Maddie) said to break it up = morning and night and has had issues since the change a few months ago.  Referrals REFERRED TO PCP OFFICE  Pt has appt to see PCP on 08/31/22

## 2022-08-31 ENCOUNTER — Encounter: Payer: Self-pay | Admitting: Family Medicine

## 2022-08-31 ENCOUNTER — Ambulatory Visit (INDEPENDENT_AMBULATORY_CARE_PROVIDER_SITE_OTHER): Payer: PPO | Admitting: Family Medicine

## 2022-08-31 VITALS — BP 152/48 | HR 55 | Temp 97.5°F | Ht 61.0 in | Wt 102.8 lb

## 2022-08-31 DIAGNOSIS — I10 Essential (primary) hypertension: Secondary | ICD-10-CM | POA: Diagnosis not present

## 2022-08-31 LAB — BASIC METABOLIC PANEL
BUN: 31 mg/dL — ABNORMAL HIGH (ref 6–23)
CO2: 29 mEq/L (ref 19–32)
Calcium: 9.9 mg/dL (ref 8.4–10.5)
Chloride: 103 mEq/L (ref 96–112)
Creatinine, Ser: 1.6 mg/dL — ABNORMAL HIGH (ref 0.40–1.20)
GFR: 27.53 mL/min — ABNORMAL LOW (ref >60.00)
Glucose, Bld: 87 mg/dL (ref 70–99)
Potassium: 3.9 mEq/L (ref 3.5–5.1)
Sodium: 141 mEq/L (ref 135–145)

## 2022-08-31 MED ORDER — AMLODIPINE BESYLATE 10 MG PO TABS: 10.0000 mg | ORAL_TABLET | Freq: Every day | ORAL | 0 refills | Status: DC

## 2022-08-31 NOTE — Assessment & Plan Note (Signed)
Current hypertension medications:      Sig   atenolol-chlorthalidone (TENORETIC) 50-25 MG tablet (Taking) Take 1 tablet by mouth every morning.   lisinopril (ZESTRIL) 40 MG tablet (Taking) Take 1 tablet (40 mg total) by mouth at bedtime.   amLODipine (NORVASC) 10 MG tablet Take 1 tablet (10 mg total) by mouth daily.     BP remains elevated despite the increase in lisinopril to 40 mg daily, will increase her amlodipine to 10 mg daily at bedtime. I will see her back in 6 weeks for another BP check. Will also check BMP today to see if her kidney function has gotten worse.

## 2022-08-31 NOTE — Patient Instructions (Signed)
Increase her amlodipine to 10 mg (2 tablets of the 5 mg) every evening. Continue checking BP daily and writing it down.

## 2022-08-31 NOTE — Progress Notes (Signed)
Kidney function has decreased since February, If increasing the amlodipine to 10 mg does not improve her BP then I think she should see the nephrologist.

## 2022-08-31 NOTE — Progress Notes (Signed)
Established Patient Office Visit  Subjective   Patient ID: Alison Cordova, female    DOB: 1926-10-15  Age: 88 y.o. MRN: [id number]  Chief Complaint  Patient presents with   Hypertension    Patient's daughter states the blood pressure readings at home has been elevated since the last visit (ranges 127/64-177/71)    Hypertension This is a recurrent problem. The problem is unchanged. The problem is uncontrolled. There are no associated agents to hypertension. Daughter has been checking her BP at home, still getting results over 140, states that they did increase her lisinopril to 40 mg. We discussed increasing her amlodipine to 10 mg daily and she is agreeable. I also want to recheck her kidney function today -- if her kidney function has worsened since February then this is likely and explanation of her difficult to control HTN.  Current Outpatient Medications  Medication Instructions   acetaminophen (TYLENOL) 650 mg, Oral, Every 8 hours PRN,     amLODipine (NORVASC) 10 mg, Oral, Daily   aspirin EC 81 mg, Oral, Daily,     atenolol-chlorthalidone (TENORETIC) 50-25 MG tablet 1 tablet, Oral, Every morning   cetirizine (ZYRTEC) 10 mg, Oral, Daily   fluticasone (FLONASE) 50 MCG/ACT nasal spray 1 spray, Each Nare, 2 times daily   ipratropium (ATROVENT) 0.03 % nasal spray 2 sprays, Each Nare, Every 12 hours   latanoprost (XALATAN) 0.005 % ophthalmic solution 1 drop, Both Eyes, Daily at bedtime,     levothyroxine (SYNTHROID) 50 mcg, Oral, Daily   lisinopril (ZESTRIL) 40 mg, Oral, Daily at bedtime   mirabegron ER (MYRBETRIQ) 50 mg, Oral, 3 times weekly   Multiple Vitamins-Minerals (PRESERVISION AREDS PO) Oral, Daily   Multiple Vitamins-Minerals (WOMENS MULTIVITAMIN PLUS PO) 1 tablet, Oral, Daily,     NON FORMULARY Post Surgical Bras    NON FORMULARY Non-Silicone Breast Prosthesis    simvastatin (ZOCOR) 20 MG tablet TAKE 1 TABLET BY MOUTH EVERYDAY AT BEDTIME   timolol (BETIMOL) 0.5 % ophthalmic  solution 1 drop, 2 times daily   tobramycin (TOBREX) 0.3 % ophthalmic ointment 1 application , As needed    Patient Active Problem List   Diagnosis Date Noted   Body mass index (BMI) of 19.0-19.9 in adult 08/14/2022   Hyperlipemia 03/27/2016   Anemia 06/17/2012   CKD (chronic kidney disease) stage 3, GFR 30-59 ml/min (HCC) 06/17/2012   Cerebrovascular disease 06/27/2010   Malignant neoplasm of female breast ([place]) 02/19/2010   Glaucoma 09/02/2008   Hypothyroidism 03/14/2008   Allergic rhinitis 03/14/2008   Urinary incontinence 03/14/2008   Essential hypertension 09/17/2007   GERD 09/17/2007      Review of Systems  All other systems reviewed and are negative.     Objective:     BP (!) 152/48 (BP Location: Right Arm, Patient Position: Sitting, Cuff Size: Normal)   Pulse (!) 55   Temp (!) 97.5 F (36.4 C) (Oral)   Ht '5\' 1"'$  (1.549 m)   Wt 102 lb 12.8 oz (46.6 kg)   SpO2 95%   BMI 19.42 kg/m    Physical Exam Physical Exam Vitals reviewed.  Constitutional:      Appearance: Normal appearance. She is well-developed.  Eyes:     Conjunctiva/sclera: Conjunctivae normal.  Cardiovascular:     Rate and Rhythm: Normal rate and regular rhythm.     Pulses: Normal pulses.  Pulmonary:     Effort: Pulmonary effort is normal.     Breath sounds: Normal breath sounds.  Neurological:  General: No focal deficit present.     Mental Status: She is alert. Mental status is at baseline.  Psychiatric:        Mood and Affect: Mood normal.        Behavior: Behavior normal.        Last metabolic panel Lab Results  Component Value Date   GLUCOSE 87 08/31/2022   NA 141 08/31/2022   K 3.9 08/31/2022   CL 103 08/31/2022   CO2 29 08/31/2022   BUN 31 (H) 08/31/2022   CREATININE 1.60 (H) 08/31/2022   CALCIUM 9.9 08/31/2022   PROT 8.1 01/14/2022   ALBUMIN 4.6 01/14/2022   BILITOT 0.6 01/14/2022   ALKPHOS 54 01/14/2022   AST 15 01/14/2022   ALT 9 01/14/2022      The ASCVD  Risk score (Arnett DK, et al., 2019) failed to calculate for the following reasons:   The 2019 ASCVD risk score is only valid for ages 23 to 6    Assessment & Plan:   Problem List Items Addressed This Visit       Cardiovascular and Mediastinum   Essential hypertension - Primary    Current hypertension medications:       Sig   atenolol-chlorthalidone (TENORETIC) 50-25 MG tablet (Taking) Take 1 tablet by mouth every morning.   lisinopril (ZESTRIL) 40 MG tablet (Taking) Take 1 tablet (40 mg total) by mouth at bedtime.   amLODipine (NORVASC) 10 MG tablet Take 1 tablet (10 mg total) by mouth daily.      BP remains elevated despite the increase in lisinopril to 40 mg daily, will increase her amlodipine to 10 mg daily at bedtime. I will see her back in 6 weeks for another BP check. Will also check BMP today to see if her kidney function has gotten worse.       Relevant Medications   amLODipine (NORVASC) 10 MG tablet   Other Relevant Orders   Basic Metabolic Panel (Completed)    Return in about 6 weeks (around 10/12/2022) for follow up blood pressure check.    Farrel Conners, MD

## 2022-09-28 ENCOUNTER — Encounter: Payer: Self-pay | Admitting: Podiatry

## 2022-09-28 ENCOUNTER — Ambulatory Visit: Payer: PPO | Admitting: Podiatry

## 2022-09-28 DIAGNOSIS — B351 Tinea unguium: Secondary | ICD-10-CM

## 2022-09-28 DIAGNOSIS — N182 Chronic kidney disease, stage 2 (mild): Secondary | ICD-10-CM | POA: Diagnosis not present

## 2022-09-28 DIAGNOSIS — M79675 Pain in left toe(s): Secondary | ICD-10-CM

## 2022-09-28 DIAGNOSIS — M79674 Pain in right toe(s): Secondary | ICD-10-CM

## 2022-09-28 NOTE — Progress Notes (Signed)
This patient returns to my office for at risk foot care.  This patient requires this care by a professional since this patient will be at risk due to having CKD.   This patient presents to the office with her daughter.  This patient is unable to cut nails herself since the patient cannot reach her nails.These nails are painful walking and wearing shoes.  This patient presents for at risk foot care today.  General Appearance  Alert, conversant and in no acute stress.  Vascular  Dorsalis pedis and posterior tibial  pulses are  weakly palpable  bilaterally.  Capillary return is within normal limits  bilaterally. Cold feet bilaterally.  Neurologic  Senn-Weinstein monofilament wire test within normal limits  bilaterally. Muscle power within normal limits bilaterally.  Nails Thick disfigured discolored nails with subungual debris  hallux toenails bilaterally. No evidence of bacterial infection or drainage bilaterally.  Orthopedic  No limitations of motion  feet .  No crepitus or effusions noted.  No bony pathology or digital deformities noted.  Skin  normotropic skin with no porokeratosis noted bilaterally.  No signs of infections or ulcers noted.     Onychomycosis  Pain in right toes  Pain in left toes  Consent was obtained for treatment procedures.   Mechanical debridement of nails 1-5  bilaterally performed with a nail nipper.  Filed with dremel without incident.    Return office visit   6  months                   Told patient to return for periodic foot care and evaluation due to potential at risk complications.   Gardiner Barefoot DPM

## 2022-10-12 ENCOUNTER — Ambulatory Visit (INDEPENDENT_AMBULATORY_CARE_PROVIDER_SITE_OTHER): Payer: PPO | Admitting: Family Medicine

## 2022-10-12 ENCOUNTER — Encounter: Payer: Self-pay | Admitting: Family Medicine

## 2022-10-12 VITALS — BP 118/60 | HR 59 | Temp 97.5°F | Ht 61.0 in | Wt 102.5 lb

## 2022-10-12 DIAGNOSIS — Z780 Asymptomatic menopausal state: Secondary | ICD-10-CM | POA: Diagnosis not present

## 2022-10-12 DIAGNOSIS — I10 Essential (primary) hypertension: Secondary | ICD-10-CM

## 2022-10-12 MED ORDER — AMLODIPINE BESYLATE 10 MG PO TABS: 10.0000 mg | ORAL_TABLET | Freq: Every day | ORAL | 3 refills | Status: DC

## 2022-10-12 NOTE — Assessment & Plan Note (Signed)
Current hypertension medications:       Sig   atenolol-chlorthalidone (TENORETIC) 50-25 MG tablet (Taking) Take 1 tablet by mouth every morning.   lisinopril (ZESTRIL) 40 MG tablet (Taking) Take 1 tablet (40 mg total) by mouth at bedtime.   amLODipine (NORVASC) 10 MG tablet Take 1 tablet (10 mg total) by mouth daily.      BP is now well controlled on the above medication. Will continue these as prescribed.

## 2022-10-12 NOTE — Progress Notes (Signed)
Established Patient Office Visit  Subjective   Patient ID: Alison Cordova, female    DOB: 16-Jan-1926  Age: 88 y.o. MRN: [id number]  Chief Complaint  Patient presents with   Follow-up    Patient presents for follow up due to elevated blood pressure, states readings range from 160/66-128/57, yesterday 153/69, later 127/60   Medication Refill    Patient requests a refill on Simvastatin and Amlodipine sent to [facility] mail order pharmacy     Patient is here for follow up of her HTN. Daughter reports that her readings at home are much better. She has not gotten dizzy at home. I reviewed the Bp readings that the daughter has brought in. Much better. BP performed in office today and is much better.    Current Outpatient Medications  Medication Instructions   acetaminophen (TYLENOL) 650 mg, Oral, Every 8 hours PRN,     amLODipine (NORVASC) 10 mg, Oral, Daily   aspirin EC 81 mg, Oral, Daily,     atenolol-chlorthalidone (TENORETIC) 50-25 MG tablet 1 tablet, Oral, Every morning   cetirizine (ZYRTEC) 10 mg, Oral, Daily   fluticasone (FLONASE) 50 MCG/ACT nasal spray 1 spray, Each Nare, 2 times daily   ipratropium (ATROVENT) 0.03 % nasal spray 2 sprays, Each Nare, Every 12 hours   latanoprost (XALATAN) 0.005 % ophthalmic solution 1 drop, Both Eyes, Daily at bedtime,     levothyroxine (SYNTHROID) 50 mcg, Oral, Daily   lisinopril (ZESTRIL) 40 mg, Oral, Daily at bedtime   mirabegron ER (MYRBETRIQ) 50 mg, Oral, 3 times weekly   Multiple Vitamins-Minerals (PRESERVISION AREDS PO) Oral, Daily   Multiple Vitamins-Minerals (WOMENS MULTIVITAMIN PLUS PO) 1 tablet, Oral, Daily,     NON FORMULARY Post Surgical Bras    NON FORMULARY Non-Silicone Breast Prosthesis    simvastatin (ZOCOR) 20 MG tablet TAKE 1 TABLET BY MOUTH EVERYDAY AT BEDTIME   timolol (BETIMOL) 0.5 % ophthalmic solution 1 drop, 2 times daily   tobramycin (TOBREX) 0.3 % ophthalmic ointment 1 application , As needed    Patient Active Problem  List   Diagnosis Date Noted   Body mass index (BMI) of 19.0-19.9 in adult 08/14/2022   Hyperlipemia 03/27/2016   Anemia 06/17/2012   CKD (chronic kidney disease) stage 3, GFR 30-59 ml/min (HCC) 06/17/2012   Cerebrovascular disease 06/27/2010   Malignant neoplasm of female breast ([place]) 02/19/2010   Glaucoma 09/02/2008   Hypothyroidism 03/14/2008   Allergic rhinitis 03/14/2008   Urinary incontinence 03/14/2008   Essential hypertension 09/17/2007   GERD 09/17/2007      Review of Systems  Constitutional:  Negative for weight loss.      Objective:     BP 118/60 (BP Location: Right Arm, Patient Position: Sitting, Cuff Size: Normal)   Pulse (!) 59   Temp (!) 97.5 F (36.4 C) (Oral)   Ht '5\' 1"'$  (1.549 m)   Wt 102 lb 8 oz (46.5 kg)   SpO2 98%   BMI 19.37 kg/m  BP Readings from Last 3 Encounters:  10/12/22 118/60  08/31/22 (!) 152/48  08/11/22 (!) 140/60      Physical Exam Vitals reviewed.  Constitutional:      Appearance: Normal appearance. She is well-groomed and normal weight.  Eyes:     Conjunctiva/sclera: Conjunctivae normal.  Neck:     Thyroid: No thyromegaly.  Cardiovascular:     Rate and Rhythm: Normal rate and regular rhythm.     Pulses: Normal pulses.     Heart sounds: S1 normal and  S2 normal.  Pulmonary:     Effort: Pulmonary effort is normal.     Breath sounds: Normal breath sounds and air entry.  Abdominal:     General: Bowel sounds are normal.  Musculoskeletal:     Right lower leg: No edema.     Left lower leg: No edema.  Neurological:     Mental Status: She is alert and oriented to person, place, and time. Mental status is at baseline.     Gait: Gait is intact.  Psychiatric:        Mood and Affect: Mood and affect normal.        Speech: Speech normal.        Behavior: Behavior normal.        Judgment: Judgment normal.      No results found for any visits on 10/12/22.    The ASCVD Risk score (Arnett DK, et al., 2019) failed to calculate  for the following reasons:   The 2019 ASCVD risk score is only valid for ages 72 to 31    Assessment & Plan:   Problem List Items Addressed This Visit       Unprioritized   Essential hypertension    Current hypertension medications:       Sig   atenolol-chlorthalidone (TENORETIC) 50-25 MG tablet (Taking) Take 1 tablet by mouth every morning.   lisinopril (ZESTRIL) 40 MG tablet (Taking) Take 1 tablet (40 mg total) by mouth at bedtime.   amLODipine (NORVASC) 10 MG tablet Take 1 tablet (10 mg total) by mouth daily.     BP is now well controlled on the above medication. Will continue these as prescribed.       Relevant Medications   amLODipine (NORVASC) 10 MG tablet   Other Visit Diagnoses     Postmenopausal state    -  Primary   Relevant Orders   Pt has not had a DEXA in 8 years, we discussed getting a new one to look for possible osteoporosis and daughter is agreable.   DG Bone Density       Return in about 6 months (around 04/12/2023) for follow up HTN.    Farrel Conners, MD

## 2022-10-22 ENCOUNTER — Telehealth: Payer: Self-pay | Admitting: Family Medicine

## 2022-10-22 DIAGNOSIS — E2839 Other primary ovarian failure: Secondary | ICD-10-CM

## 2022-10-22 DIAGNOSIS — Z780 Asymptomatic menopausal state: Secondary | ICD-10-CM

## 2022-10-22 NOTE — Telephone Encounter (Signed)
Seth Bake with [facility]  FYI: Bone Density is invalid   *Requesting a call back [phone] Ext [phone]

## 2022-10-22 NOTE — Telephone Encounter (Signed)
Noted  

## 2022-10-22 NOTE — Telephone Encounter (Signed)
I called [facility] and per Seth Bake due to the patient's age, the diagnosis needs be changed to post-menopausal estrogen deficiency.  Message sent to PCP.

## 2022-10-22 NOTE — Telephone Encounter (Signed)
Ok I modified the order

## 2022-11-09 ENCOUNTER — Telehealth: Payer: Self-pay | Admitting: Pharmacist

## 2022-11-09 NOTE — Chronic Care Management (AMB) (Signed)
  Chronic Care Management Pharmacy Assistant   Name: Alison Cordova  MRN: [id number] DOB: 06/09/1926  Reason for Encounter: Disease State / Hypertension Assessment Call   Conditions to be addressed/monitored: HTN  Recent office visits:  10/12/2022 Alison Michael MD - Patient was seen for postmenopausal state and essential hypertension. No medication changes. Follow up in 6 months.   08/31/2022  Alison Michael MD -Patient was seen for essential hypertension. Increased Amlodipine to 10 mg daily. Follow up in 6 weeks.   08/11/2022 Alison Michael MD - Patient was seen for essential hypertension and additional concerns. Changed Tenoretic from 1 daily to 1 every morning and Lisinopril from 1 daily to 1 at bedtime.  Follow up in 6 months.   Recent consult visits:  09/28/2022 Alison Cordova DPM - Patient was seen for Pain due to onychomycosis of toenails of both feet and an additional concern. No medication changes. Follow up in 6 months.   Hospital visits:  None  Medications: Outpatient Encounter Medications as of 11/09/2022  Medication Sig   acetaminophen (TYLENOL) 650 MG CR tablet Take 650 mg by mouth every 8 (eight) hours as needed.   amLODipine (NORVASC) 10 MG tablet Take 1 tablet (10 mg total) by mouth daily.   aspirin EC 81 MG tablet Take 81 mg by mouth daily.   atenolol-chlorthalidone (TENORETIC) 50-25 MG tablet Take 1 tablet by mouth every morning.   cetirizine (ZYRTEC) 10 MG tablet Take 1 tablet (10 mg total) by mouth daily.   fluticasone (FLONASE) 50 MCG/ACT nasal spray Place 1 spray into both nostrils in the morning and at bedtime.   ipratropium (ATROVENT) 0.03 % nasal spray Place 2 sprays into both nostrils every 12 (twelve) hours.   latanoprost (XALATAN) 0.005 % ophthalmic solution Place 1 drop into both eyes at bedtime.   levothyroxine (SYNTHROID) 50 MCG tablet Take 1 tablet (50 mcg total) by mouth daily.   lisinopril (ZESTRIL) 40 MG tablet Take 1 tablet (40 mg total) by  mouth at bedtime.   mirabegron ER (MYRBETRIQ) 50 MG TB24 tablet Take 50 mg by mouth 3 (three) times a week.   Multiple Vitamins-Minerals (PRESERVISION AREDS PO) Take by mouth daily.   Multiple Vitamins-Minerals (WOMENS MULTIVITAMIN PLUS PO) Take 1 tablet by mouth daily.   NON FORMULARY Post Surgical Bras   NON FORMULARY Non-Silicone Breast Prosthesis   simvastatin (ZOCOR) 20 MG tablet TAKE 1 TABLET BY MOUTH EVERYDAY AT BEDTIME   timolol (BETIMOL) 0.5 % ophthalmic solution Place 1 drop into both eyes 2 (two) times daily.   tobramycin (TOBREX) 0.3 % ophthalmic ointment Place 1 application into the left eye as needed.   No facility-administered encounter medications on file as of 11/09/2022.  Fill History:   Dispensed Days Supply Quantity Provider Pharmacy  amLODIpine (NORVASC) tablet 08/13/2022 90 90      Dispensed Days Supply Quantity Provider Pharmacy  atenolol-chlorthalidone (TENORETIC) tablet 50-25 mg 06/15/2022 90 90      Dispensed Days Supply Quantity Provider Pharmacy  IPRATROPIUM BROMIDE 0.03% SOLUTION 10/03/2021 86 60 each      Dispensed Days Supply Quantity Provider Pharmacy  levothyroxine (SYNTHROID, LEVOTHROID) tablet 06/28/2022 90 90      Dispensed Days Supply Quantity Provider Pharmacy  lisinopril (PRINIVIL,ZESTRIL) tablet 08/13/2022 90 90      Dispensed Days Supply Quantity Provider Pharmacy  simvastatin (ZOCOR) tablet 07/23/2022 90 90     Reviewed chart prior to disease state call. Spoke with patient regarding BP  Recent Office Vitals: BP Readings from   Last 3 Encounters:  10/12/22 118/60  08/31/22 (!) 152/48  08/11/22 (!) 140/60   Pulse Readings from Last 3 Encounters:  10/12/22 (!) 59  08/31/22 (!) 55  08/11/22 60    Wt Readings from Last 3 Encounters:  10/12/22 102 lb 8 oz (46.5 kg)  08/31/22 102 lb 12.8 oz (46.6 kg)  08/11/22 102 lb 12.8 oz (46.6 kg)     Kidney Function Lab Results  Component Value Date/Time   CREATININE 1.60 (H) 08/31/2022 11:51  AM   CREATININE 1.45 (H) 01/14/2022 10:37 AM   CREATININE 1.7 (H) 12/19/2012 08:17 AM   GFR 27.53 (L) 08/31/2022 11:51 AM   GFRNONAA 42 (L) 06/09/2010 09:00 AM   GFRAA (L) 06/09/2010 09:00 AM    51        The eGFR has been calculated using the MDRD equation. This calculation has not been validated in all clinical situations. eGFR's persistently <60 mL/min signify possible Chronic Kidney Disease.       Latest Ref Rng & Units 08/31/2022   11:51 AM 01/14/2022   10:37 AM 07/14/2021    1:24 PM  BMP  Glucose 70 - 99 mg/dL 87  84  97   BUN 6 - 23 mg/dL 31  26  23   Creatinine 0.40 - 1.20 mg/dL 1.60  1.45  1.40   Sodium 135 - 145 mEq/L 141  140  141   Potassium 3.5 - 5.1 mEq/L 3.9  4.5  4.1   Chloride 96 - 112 mEq/L 103  104  101   CO2 19 - 32 mEq/L 29  29  27   Calcium 8.4 - 10.5 mg/dL 9.9  10.4  9.9     Current antihypertensive regimen:  Amlodipine 10 mg daily Tenoretic 50/25 mg daily Lisinopril 40 mg daily  How often are you checking your Blood Pressure?  Patients daughter is checking her blood pressures as often as she can, patient isn't always willing to take off her robe to have her blood pressure checked.   Current home BP readings: 11/01/2022 - 130/58 and 11/08/2022 - 114/58.  What recent interventions/DTPs have been made by any provider to improve Blood Pressure control since last CPP Visit: On 08/31/2022 Amlodipine was increased to 10 mg daily.   Any recent hospitalizations or ED visits since last visit with CPP? No recent hospital visits.  What diet changes have been made to improve Blood Pressure Control?  Patient is not following any specific diet Breakfast - patient will have rye bread with an egg and fruit Lunch - patient doesn't eat lunch due to eating breakfast late Dinner - patient will have a meat and vegetable  Snack - patient will eat fruits  What exercise is being done to improve your Blood Pressure Control?  Patient will walk outside when it is warm  outside and up and down the hallway other days.   Adherence Review: Is the patient currently on ACE/ARB medication? Yes Does the patient have >5 day gap between last estimated fill dates? No  Care Gaps: AWV - scheduled 12/31/2022 Last BP - 118/60 on 10/12/2022 Last A1C - 5.5 on 08/14/2019 Tdap - overdue AWV - due soon Shingrix - postponed  Star Rating Drugs: Lisinopril 40 mg - last filled 08/13/2022 90 DS at [facility] Simvastatin 20 mg - last filled 10/29/2022 90 DS at [facility] verified with pharmacist  Janie Johnson CMA  Clinical Pharmacist Assistant [phone]  

## 2022-11-24 DIAGNOSIS — H43813 Vitreous degeneration, bilateral: Secondary | ICD-10-CM | POA: Diagnosis not present

## 2022-11-24 DIAGNOSIS — H348312 Tributary (branch) retinal vein occlusion, right eye, stable: Secondary | ICD-10-CM | POA: Diagnosis not present

## 2022-11-24 DIAGNOSIS — H401132 Primary open-angle glaucoma, bilateral, moderate stage: Secondary | ICD-10-CM | POA: Diagnosis not present

## 2022-11-24 DIAGNOSIS — H59812 Chorioretinal scars after surgery for detachment, left eye: Secondary | ICD-10-CM | POA: Diagnosis not present

## 2022-11-24 DIAGNOSIS — H3582 Retinal ischemia: Secondary | ICD-10-CM | POA: Diagnosis not present

## 2022-11-24 DIAGNOSIS — H34832 Tributary (branch) retinal vein occlusion, left eye, with macular edema: Secondary | ICD-10-CM | POA: Diagnosis not present

## 2022-12-23 DIAGNOSIS — N3946 Mixed incontinence: Secondary | ICD-10-CM | POA: Diagnosis not present

## 2022-12-23 DIAGNOSIS — R350 Frequency of micturition: Secondary | ICD-10-CM | POA: Diagnosis not present

## 2022-12-31 ENCOUNTER — Telehealth (INDEPENDENT_AMBULATORY_CARE_PROVIDER_SITE_OTHER): Payer: HMO | Admitting: Family Medicine

## 2022-12-31 VITALS — BP 128/66 | HR 96

## 2022-12-31 DIAGNOSIS — Z0000 Encounter for general adult medical examination without abnormal findings: Secondary | ICD-10-CM

## 2022-12-31 NOTE — Progress Notes (Signed)
PATIENT CHECK-IN and HEALTH RISK ASSESSMENT QUESTIONNAIRE:  -completed by phone/video for upcoming Medicare Preventive Visit  Pre-Visit Check-in: 1)Vitals (height, wt, BP, etc) - record in vitals section for visit on day of visit 2)Review and Update Medications, Allergies PMH, Surgeries, Social history in Epic 3)Hospitalizations in the last year with date/reason?  No 4)Review and Update Care Team (patient's specialists) in Epic 5) Complete PHQ9 in Epic  6) Complete Fall Screening in Epic 7)Review all Health Maintenance Due and order under PCP if not done.  8)Medicare Wellness Questionnaire: Answer theses question about your habits: Do you drink alcohol? Wine  If yes, how many drinks do you have a day?1 glass once per month Have you ever smoked?Yes   Quit date if applicable?  quit in 1960 How many packs a day do/did you smoke? N/A Do you use smokeless tobacco?No Do you use an illicit drugs?No Do you exercises? Yes - does some exercises at home, is active at home and gets around well. She uses her cane and walker.  Are you sexually active? No Number of partners? N/A She still eats several times per day Typical breakfast: fried egg, toast, pancake Typical lunch: skips lunch due to having late breakfast Typical dinner: hamburgers with gravy, mac and cheese, steak, bake potatoes Typical snacks:circus peanuts, gum drops, candy  Beverages: coffee, black cherry sparking water  Answer theses question about you: Can you perform most household chores?Daughter  [name] does chores Do you find it hard to follow a conversation in a noisy room?Yes Do you often ask people to speak up or repeat themselves?Yes Do you feel that you have a problem with memory?Yes Do you balance your checkbook and or bank acounts?No Do you feel safe at home?Yes Last dentist visit?Dentures Do you need assistance with any of the following: Please note if so Yes  Driving?-Yes  Feeding yourself?-Yes  Getting from bed to  chair?-Yes  Getting to the toilet?-Yes  Bathing or showering?-Yes  Dressing yourself?----Patient dresses herself  Managing money?-Yes  Climbing a flight of stairs-Yes  Preparing meals?-Yes  Do you have Advanced Directives in place (Living Will, Healthcare Power or Attorney)? Yes   Last eye Exam and location?-11/24/2022-Dr. Sherlynn Stalls   Do you currently use prescribed or non-prescribed narcotic or opioid pain medications?No  Do you have a history or close family history of breast, ovarian, tubal or peritoneal cancer or a family member with BRCA (breast cancer susceptibility 1 and 2) gene mutations?  Nurse/Assistant Credentials/time stamp:   ----------------------------------------------------------------------------------------------------------------------------------------------------------------------------------------------------------------------   MEDICARE ANNUAL PREVENTIVE VISIT WITH PROVIDER: (Welcome to [employer], initial annual wellness or annual wellness exam)  Virtual Visit via Phone Note  I connected with Samanvitha  on 12/31/22 by phone and verified that I am speaking with the correct person using two identifiers.  Location patient: home Location provider:work or home office Persons participating in the virtual visit: patient, provider, patient's daughter susan  Concerns and/or follow up today: doing well, BP improving - has been working on this with PCP.    See HM section in Epic for other details of completed HM.    ROS: negative for report of fevers, unintentional weight loss, vision changes, vision loss, hearing loss or change, chest pain, sob, hemoptysis, melena, hematochezia, hematuria, genital discharge or lesions, falls, bleeding or bruising, loc, thoughts of suicide or self harm, memory loss  Patient-completed extensive health risk assessment - reviewed and discussed with the patient: See Health Risk Assessment completed with patient prior to the visit either  above or in  recent phone note. This was reviewed in detailed with the patient today and appropriate recommendations, orders and referrals were placed as needed per Summary below and patient instructions.   Review of Medical History: -PMH, PSH, Family History and current specialty and care providers reviewed and updated and listed below   Patient Care Team: Farrel Conners, MD as PCP - General (Family Medicine) Lafayette Dragon, MD (Inactive) (Gastroenterology) Heath Lark, MD as Consulting Physician (Hematology and Oncology) Bjorn Loser, MD as Consulting Physician (Urology) Sherlynn Stalls, MD as Consulting Physician (Ophthalmology) [name], [facility] (Inactive) as Pharmacist (Pharmacist)   Past Medical History:  Diagnosis Date   Allergic rhinitis    Anemia 06/17/2012   Anxiety and depression    Cataract 06/17/2012   Chronic kidney disease    CVA (cerebral vascular accident) [facility])    Diverticulosis 03/14/2008   Qualifier: Diagnosis of  By: Lenna Gilford MD, Deborra Medina    Glaucoma    Hemorrhoids 07/03/2011   Hx of breast cancer    Left   Hyperlipemia    Hyperlipidemia    Hypertension    Hypothyroid    Irritable bowel syndrome 09/17/2007   Qualifier: Diagnosis of  By: Rosana Hoes CMA, Tammy     Lung nodule 07/06/2011   Peripheral neuropathy    Urinary incontinence     Past Surgical History:  Procedure Laterality Date    FUNDIPLICATION     BREAST SURGERY     left breast mastectomy    EYE SURGERY     TO DESOLVE AN AREA IN HER LEFT EYE?   SPINE SURGERY     VAGINAL HYSTERECTOMY      Social History   Socioeconomic History   Marital status: Widowed    Spouse name: Not on file   Number of children: 3   Years of education: Not on file   Highest education level: Some college, no degree  Occupational History   Not on file  Tobacco Use   Smoking status: Former    Types: Cigarettes    Quit date: 11/23/1962    Years since quitting: 60.1   Smokeless tobacco: Never  Substance and  Sexual Activity   Alcohol use: Yes    Comment: PT STATES DRINKS A BEER OR WINE OCCASSIONALLY, NOT WEEKLY   Drug use: No   Sexual activity: Not on file  Other Topics Concern   Not on file  Social History Narrative   Work or School: none      Home Situation: lives with daughter and son and law      Spiritual Beliefs: [employer]      Lifestyle: no regular exercise; diet is ok            Social Determinants of Radio broadcast assistant Strain: Low Risk  (07/21/2022)   Overall Financial Resource Strain (CARDIA)    Difficulty of Paying Living Expenses: Not hard at all  Food Insecurity: No Food Insecurity (12/30/2021)   Hunger Vital Sign    Worried About Running Out of Food in the Last Year: Never true    [place] in the Last Year: Never true  Transportation Needs: No Transportation Needs (12/30/2021)   PRAPARE - Hydrologist (Medical): No    Lack of Transportation (Non-Medical): No  Physical Activity: Inactive (12/30/2021)   Exercise Vital Sign    Days of Exercise per Week: 0 days    Minutes of Exercise per Session: 0 min  Stress:  No Stress Concern Present (12/30/2021)   [place]    Feeling of Stress : Not at all  Social Connections: Socially Isolated (12/30/2021)   Social Connection and Isolation Panel [NHANES]    Frequency of Communication with Friends and Family: More than three times a week    Frequency of Social Gatherings with Friends and Family: More than three times a week    Attends Religious Services: Never    Marine scientist or Organizations: No    Attends Archivist Meetings: Never    Marital Status: Widowed  Intimate Partner Violence: Not At Risk (12/30/2021)   Humiliation, Afraid, Rape, and Kick questionnaire    Fear of Current or Ex-Partner: No    Emotionally Abused: No    Physically Abused: No    Sexually Abused: No    No family history on  file.  Current Outpatient Medications on File Prior to Visit  Medication Sig Dispense Refill   acetaminophen (TYLENOL) 650 MG CR tablet Take 650 mg by mouth every 8 (eight) hours as needed.     amLODipine (NORVASC) 10 MG tablet Take 1 tablet (10 mg total) by mouth daily. 90 tablet 3   aspirin EC 81 MG tablet Take 81 mg by mouth daily.     atenolol-chlorthalidone (TENORETIC) 50-25 MG tablet Take 1 tablet by mouth every morning. 90 tablet 3   cetirizine (ZYRTEC) 10 MG tablet Take 1 tablet (10 mg total) by mouth daily. 90 tablet 3   fluticasone (FLONASE) 50 MCG/ACT nasal spray Place 1 spray into both nostrils in the morning and at bedtime.     ipratropium (ATROVENT) 0.03 % nasal spray Place 2 sprays into both nostrils every 12 (twelve) hours. 60 mL 1   latanoprost (XALATAN) 0.005 % ophthalmic solution Place 1 drop into both eyes at bedtime.     levothyroxine (SYNTHROID) 50 MCG tablet Take 1 tablet (50 mcg total) by mouth daily. 90 tablet 3   lisinopril (ZESTRIL) 40 MG tablet Take 1 tablet (40 mg total) by mouth at bedtime. 90 tablet 1   mirabegron ER (MYRBETRIQ) 50 MG TB24 tablet Take 50 mg by mouth 3 (three) times a week.     Multiple Vitamins-Minerals (PRESERVISION AREDS PO) Take by mouth daily.     Multiple Vitamins-Minerals (WOMENS MULTIVITAMIN PLUS PO) Take 1 tablet by mouth daily.     NON FORMULARY Post Surgical Bras     NON FORMULARY Non-Silicone Breast Prosthesis     simvastatin (ZOCOR) 20 MG tablet TAKE 1 TABLET BY MOUTH EVERYDAY AT BEDTIME 90 tablet 3   timolol (BETIMOL) 0.5 % ophthalmic solution Place 1 drop into both eyes 2 (two) times daily.     tobramycin (TOBREX) 0.3 % ophthalmic ointment Place 1 application into the left eye as needed.     No current facility-administered medications on file prior to visit.    No Known Allergies     Physical Exam Vitals:   12/31/22 1127  BP: 128/66  Pulse: 96   Estimated body mass index is 19.37 kg/m as calculated from the  following:   Height as of 10/12/22: '5\' 1"'$  (1.549 m).   Weight as of 10/12/22: 102 lb 8 oz (46.5 kg).  EKG (optional): deferred due to virtual visit  GENERAL: alert, oriented, no acute distress detected, full vision exam deferred due to pandemic and/or virtual encounter  PSYCH/NEURO: pleasant and cooperative, no obvious depression or anxiety, speech and thought processing grossly intact,  Cognitive function grossly intact  Flowsheet Row Video Visit from 12/31/2022 in [facility]  PHQ-9 Total Score 0           12/31/2022   11:07 AM 12/31/2022   10:53 AM 08/11/2022    3:05 PM 01/14/2022   10:39 AM 12/30/2021   11:29 AM  Depression screen PHQ 2/9  Decreased Interest 0 0  1 0  Down, Depressed, Hopeless 0 0 0 0 0  PHQ - 2 Score 0 0 0 1 0  Altered sleeping 0  1 1   Tired, decreased energy 0  3 1   Change in appetite   2 1   Feeling bad or failure about yourself  0  0 0   Trouble concentrating 0  0 0   Moving slowly or fidgety/restless 0   1   Suicidal thoughts 0  0 1   PHQ-9 Score 0  6 6   Difficult doing work/chores   Not difficult at all         11/21/2018    9:30 AM 12/21/2019    3:34 PM 12/25/2020   11:57 AM 12/30/2021   11:39 AM 12/31/2022   10:53 AM  Fall Risk  Falls in the past year? 0 0 0 0 0  Was there an injury with Fall? 0  0 0 0  Fall Risk Category Calculator 0  0 0 0  Fall Risk Category (Retired) Low  Low Low   (RETIRED) Patient Fall Risk Level Low fall risk Low fall risk  Low fall risk   Patient at Risk for Falls Due to  Medication side effect Impaired mobility;Impaired balance/gait;Impaired vision No Fall Risks   Fall risk Follow up  Falls evaluation completed;Education provided;Falls prevention discussed Falls prevention discussed       SUMMARY AND PLAN:  Encounter for Medicare annual wellness exam   Discussed applicable health maintenance/preventive health measures and advised and referred or ordered per patient preferences: They  have declined the tetanus and shingles vaccines.  Health Maintenance  Topic Date Due   Zoster Vaccines- Shingrix (1 of 2) Never done   DTaP/Tdap/Td (2 - Tdap) 02/20/2020   COVID-19 Vaccine (5 - 2023-24 season) 08/24/2023 (Originally 11/19/2022)   Medicare Annual Wellness (AWV)  01/01/2024   Pneumonia Vaccine 34+ Years old  Completed   INFLUENZA VACCINE  Completed   DEXA SCAN  Completed   HPV VACCINES  Aged Out   MAMMOGRAM  Discontinued    Education and counseling on the following was provided based on the above review of health and a plan/checklist for the patient, along with additional information discussed, was provided for the patient in the patient instructions :  -Provided counseling and plan for increased risk of falling if applicable per above screening. They are considering doing some safe balance exercises together. She is doing remarkable well for her age and still gets out [place] walks [place] and [place] with her walker. Encouraged to continue. She also is doing chair exercises which is great! Keep it up -info on healthy diet provided in patient instructions  -BP looks good today. -Advise regular exams   Follow up: see patient instructions     There are no Patient Instructions on file for this visit.  Lucretia Kern, DO

## 2022-12-31 NOTE — Patient Instructions (Signed)
I really enjoyed getting to talk with you today! I am available on Tuesdays and Thursdays for virtual visits if you have any questions or concerns, or if I can be of any further assistance.   CHECKLIST FROM ANNUAL WELLNESS VISIT:  -Follow up (please call to schedule if not scheduled after visit):   -yearly for annual wellness visit with primary care office  Here is a list of your preventive care/health maintenance measures and the plan for each if any are due:  Health Maintenance  Topic Date Due   Zoster Vaccines- Shingrix (1 of 2) Noted declined, may get at pharmacy if you ever feel she wishes to do it.    DTaP/Tdap/Td (2 - Tdap) Noted declined, may get at pharmacy or office if you feel is needed at any point.   COVID-19 Vaccine (5 - 2023-24 season) 08/24/2023 (Originally 11/19/2022)   Medicare Annual Wellness (AWV)  01/01/2024   Pneumonia Vaccine 36+ Years old  Completed   INFLUENZA VACCINE  Completed   DEXA SCAN  Completed   HPV VACCINES  Aged Out   MAMMOGRAM  Discontinued    -See a dentist at least yearly  -Get your eyes checked and then per your eye specialist's recommendations  -Other issues addressed today:  Keep up what you are doing with exercise and staying active! I included the balance exercises we discussed below.  -I have included below further information regarding a healthy whole foods based diet, physical activity guidelines for adults, stress management and opportunities for social connections. I hope you find this information useful.     NUTRITION: -eat real food: lots of colorful vegetables (half the plate) and fruits -5-7 servings of vegetables and fruits per day (fresh or steamed is best), exp. 2 servings of  vegetables with lunch and dinner and 2 servings of fruit per day. Berries and greens such as kale and collards are great choices.  -consume on a regular basis: whole grains (make sure first ingredient on label contains the word "whole"), fresh fruits, fish, nuts, seeds, healthy oils (such as olive oil, avocado oil, grape seed oil) -may eat small amounts of dairy and lean meat on occasion, but avoid processed meats such as ham, bacon, lunch meat, etc. -drink water -try to avoid fast food and pre-packaged foods, processed meat -most experts advise limiting sodium to < '2300mg'$  per day, should limit further is any chronic conditions such as high blood pressure, heart disease, diabetes, etc. The American Heart Association advised that < '1500mg'$  is is ideal -try to avoid foods that contain any ingredients with names you do not recognize  -try to avoid sugar/sweets (except for the natural sugar that occurs in fresh fruit) -try to avoid sweet drinks -try to avoid Goth[place] rice, [place] bread, pasta (unless whole grain), Neyra or yellow potatoes  EXERCISE GUIDELINES FOR ADULTS: -if you wish to increase your physical activity, do so gradually and with the approval of your doctor -STOP and seek medical care immediately if you have any chest pain, chest discomfort or trouble breathing when starting or increasing exercise  -move and stretch your body, legs, feet and arms when sitting for long periods -Physical activity guidelines for optimal health in adults: -least 150 minutes per week of aerobic exercise (can talk, but not sing) once approved by your doctor, 20-30 minutes of sustained activity or two 10 minute episodes of sustained activity every day.  -resistance training at least 2 days per week if approved by your doctor -balance exercises 3+ days  per week:   Stand somewhere where you have something sturdy to hold onto if you lose balance.    1) lift up on toes, start with 5x per day and work up to 20x   2)  stand and lift on leg straight out to the side so that foot is a few inches of the floor, start with 5x each side and work up to 20x each side   3) stand on one foot, start with 5 seconds each side and work up to 20 seconds on each side  If you need ideas or help with getting more active:  -Silver sneakers https://tools.silversneakers.com  -Walk with a Doc: http://stephens-thompson.biz/  -try to include resistance (weight lifting/strength building) and balance exercises twice per week: or the following link for ideas: ChessContest.fr  UpdateClothing.com.cy  STRESS MANAGEMENT: -can try meditating, or just sitting quietly with deep breathing while intentionally relaxing all parts of your body for 5 minutes daily -if you need further help with stress, anxiety or depression please follow up with your primary doctor or contact the wonderful folks at [place]: [place]: -options in [place] if you wish to engage in more social and exercise related activities:  -Silver sneakers https://tools.silversneakers.com  -Walk with a Doc: http://stephens-thompson.biz/  -Check out the [place] 50+ section on the [place] [employer] (hiking clubs, book clubs, cards and games, chess, exercise classes, aquatic classes and much more) - see the website for details: https://www.[place].gov/departments/parks-recreation/active-adults50  -YouTube has lots of exercise videos for different ages and abilities as well  -[place] (a variety of indoor and outdoor inperson activities for adults). 515-700-8076. [address]  -Virtual Online Classes (a variety of topics): see seniorplanet.org or call 4015708936  -consider volunteering at a school, hospice center, church, senior center or elsewhere

## 2023-02-09 ENCOUNTER — Encounter: Payer: Self-pay | Admitting: Family Medicine

## 2023-02-09 ENCOUNTER — Ambulatory Visit (INDEPENDENT_AMBULATORY_CARE_PROVIDER_SITE_OTHER): Payer: HMO | Admitting: Family Medicine

## 2023-02-09 VITALS — BP 122/60 | HR 63 | Temp 98.0°F | Ht <= 58 in | Wt 96.0 lb

## 2023-02-09 DIAGNOSIS — K1379 Other lesions of oral mucosa: Secondary | ICD-10-CM | POA: Diagnosis not present

## 2023-02-09 DIAGNOSIS — H6121 Impacted cerumen, right ear: Secondary | ICD-10-CM | POA: Diagnosis not present

## 2023-02-09 DIAGNOSIS — M19041 Primary osteoarthritis, right hand: Secondary | ICD-10-CM

## 2023-02-09 NOTE — Progress Notes (Signed)
Established Patient Office Visit  Subjective   Patient ID: Alison Cordova, female    DOB: 1926/03/22  Age: 89 y.o. MRN: [id number]  Chief Complaint  Patient presents with   Dental Pain    Patient's daughter states the patient has complained of soreness in the mouth x1 week, questioned if related to dentures and decreased appetite noted   Hand Pain    Patient complains of right hand pain x1 week, no known injury   Cerumen Impaction    Patient complains of hearing loss and wax buildup x1 week    Pt is here with some new symptoms today  Mouth pain-- sister reports that she complained of mouth pain for the last week or so. States that she thought it was her dentures and so she took them out and her mouth is feeling better. States that she wouldn't eat for a while due to the mouth pain, states that she is trying to keep her hydrated.  Right hand pain-- pt reports intermittent right hand pain, daughter reports that she gives her tylenol 650 mg 4 capsules a day. States that she will not use her hand sometimes due to the pain.  Right ear wax-- daughter states she thinks there is a plug of wax in her ear on the right and would like it cleaned out today.   Current Outpatient Medications  Medication Instructions   acetaminophen (TYLENOL) 650 mg, Oral, Every 8 hours PRN,     amLODipine (NORVASC) 10 mg, Oral, Daily   aspirin EC 81 mg, Oral, Daily,     atenolol-chlorthalidone (TENORETIC) 50-25 MG tablet 1 tablet, Oral, Every morning   cetirizine (ZYRTEC) 10 mg, Oral, Daily   fluticasone (FLONASE) 50 MCG/ACT nasal spray 1 spray, Each Nare, 2 times daily   ipratropium (ATROVENT) 0.03 % nasal spray 2 sprays, Each Nare, Every 12 hours   latanoprost (XALATAN) 0.005 % ophthalmic solution 1 drop, Both Eyes, Daily at bedtime,     levothyroxine (SYNTHROID) 50 mcg, Oral, Daily   lisinopril (ZESTRIL) 40 mg, Oral, Daily at bedtime   mirabegron ER (MYRBETRIQ) 50 mg, Oral, 3 times weekly   Multiple  Vitamins-Minerals (PRESERVISION AREDS PO) Oral, Daily   Multiple Vitamins-Minerals (WOMENS MULTIVITAMIN PLUS PO) 1 tablet, Oral, Daily,     NON FORMULARY Post Surgical Bras    NON FORMULARY Non-Silicone Breast Prosthesis    simvastatin (ZOCOR) 20 MG tablet TAKE 1 TABLET BY MOUTH EVERYDAY AT BEDTIME   timolol (BETIMOL) 0.5 % ophthalmic solution 1 drop, 2 times daily   tobramycin (TOBREX) 0.3 % ophthalmic ointment 1 application , As needed    Patient Active Problem List   Diagnosis Date Noted   Body mass index (BMI) of 19.0-19.9 in adult 08/14/2022   Hyperlipemia 03/27/2016   Anemia 06/17/2012   CKD (chronic kidney disease) stage 3, GFR 30-59 ml/min (HCC) 06/17/2012   Cerebrovascular disease 06/27/2010   Malignant neoplasm of female breast ([place]) 02/19/2010   Glaucoma 09/02/2008   Hypothyroidism 03/14/2008   Allergic rhinitis 03/14/2008   Urinary incontinence 03/14/2008   Essential hypertension 09/17/2007   GERD 09/17/2007      Review of Systems  All other systems reviewed and are negative.     Objective:     BP 122/60 (BP Location: Right Arm, Patient Position: Sitting, Cuff Size: Normal)   Pulse 63   Temp 98 F (36.7 C) (Oral)   Ht 4' 8.5" (1.435 m)   Wt 96 lb (43.5 kg)   SpO2 96%  BMI 21.14 kg/m    Physical Exam Vitals reviewed.  Constitutional:      Appearance: Normal appearance. She is normal weight.  HENT:     Right Ear: Tympanic membrane normal. There is impacted cerumen.     Left Ear: Tympanic membrane normal.     Mouth/Throat:     Mouth: Mucous membranes are moist.     Pharynx: No oropharyngeal exudate or posterior oropharyngeal erythema.  Eyes:     Conjunctiva/sclera: Conjunctivae normal.  Musculoskeletal:     Right hand: No swelling, deformity or bony tenderness. Normal range of motion. Normal strength.     Comments: Heberden's nodes present at the MCP joints  Neurological:     General: No focal deficit present.     Mental Status: Mental status is  at baseline.    Ear wax removal on the right: After Verbal informed consent was obtained from the patient and daughter, I used a lighted ear curette to remove a large piece of wax from the right ear canal which came out in one piece. Irrigation was not required. EAC was cleared and TM was WNL on the right.   No results found for any visits on 02/09/23.    The ASCVD Risk score (Arnett DK, et al., 2019) failed to calculate for the following reasons:   The 2019 ASCVD risk score is only valid for ages 73 to 64   The patient has a prior MI or stroke diagnosis    Assessment & Plan:   Problem List Items Addressed This Visit   None Visit Diagnoses     Primary osteoarthritis of right hand    -  Primary  I advised daughter to increase the amount of tylenol, may give 2 capsules in the morning, 1 in the afternoon and 2 at night if needed. Pt has full ROM of the right hand    Impacted cerumen of right ear      Resolved after manual debridement of the right EAC in office today    Mouth pain      Secondary to her dentures, there are no ulcerations, swelling, redness or Aldana patches in the mouth on exam. Recommended going back to the dentist to have the dentures refitted or she may take them out and use softer foods to help with nutrition.        No follow-ups on file.    Farrel Conners, MD

## 2023-02-26 ENCOUNTER — Telehealth: Payer: Self-pay

## 2023-02-26 NOTE — Progress Notes (Signed)
Care Management & Coordination Services Pharmacy Team  Reason for Encounter: Hypertension  Contacted patient to discuss hypertension disease state. Spoke with daughter on 02/26/2023   Current antihypertensive regimen:  Amlodipine 10 mg daily Tenoretic 50/25 mg daily Lisinopril 40 mg daily Patient verbally confirms she is taking the above medications as directed. Yes  How often are you checking your Blood Pressure?  Patients daughter is only checking when her mother is willing to take off her house coat, she doesn't push her due to her blood pressure readings always being in good range.    Current home BP readings: Patients last two readings were Feb 2024 128/66 and March 2024 135/62  Wrist or arm cuff: Arm cuff  OTC medications including pseudoephedrine or NSAIDs? Daughter denies  Any readings above 180/100?  Daughter denies  What recent interventions/DTPs have been made by any provider to improve Blood Pressure control since last CPP Visit: No recent interventions  Any recent hospitalizations or ED visits since last visit with CPP? No recent hospital visits.   What diet changes have been made to improve Blood Pressure Control?  Patient is not following any specific diet, she hasn't been eating as well recently Breakfast - rye bread with an egg and fruit Lunch - patient doesn't eat lunch due to eating breakfast late Dinner - a meat and vegetable but patient just picks at it  [employer] - daughter is trying to get her to drink protein drinks Caffeine intake - coffee Salt intake - no salt added  What exercise is being done to improve your Blood Pressure Control?  Patient will walk outside when it is warm outside and up and down the hallway other days.   Adherence Review: Is the patient currently on ACE/ARB medication? Yes Does the patient have >5 day gap between last estimated fill dates? No  Care Gaps: AWV - completed 12/31/2022 Shingrix - never done Tdap - overdue Covid -  postponed   Star Rating Drugs: Lisinopril 40 mg - last filled 11/25/2022 90 DS at [facility] Simvastatin 20 mg - [facility]  Chart Updates: Recent office visits:  02/09/2023 Nira ConnBarbara Michael MD - Patient was seen for primary osteoarthritis of right hand and additional concerns. No medication changes.   12/31/2022 Kriste BasqueHannah Kim DO - Encounter for Medicare annual wellness exam   Recent consult visits:  None  Hospital visits:  None Medications: Outpatient Encounter Medications as of 02/26/2023  Medication Sig   acetaminophen (TYLENOL) 650 MG CR tablet Take 650 mg by mouth every 8 (eight) hours as needed.   amLODipine (NORVASC) 10 MG tablet Take 1 tablet (10 mg total) by mouth daily.   aspirin EC 81 MG tablet Take 81 mg by mouth daily.   atenolol-chlorthalidone (TENORETIC) 50-25 MG tablet Take 1 tablet by mouth every morning.   cetirizine (ZYRTEC) 10 MG tablet Take 1 tablet (10 mg total) by mouth daily.   fluticasone (FLONASE) 50 MCG/ACT nasal spray Place 1 spray into both nostrils in the morning and at bedtime.   ipratropium (ATROVENT) 0.03 % nasal spray Place 2 sprays into both nostrils every 12 (twelve) hours.   latanoprost (XALATAN) 0.005 % ophthalmic solution Place 1 drop into both eyes at bedtime.   levothyroxine (SYNTHROID) 50 MCG tablet Take 1 tablet (50 mcg total) by mouth daily.   lisinopril (ZESTRIL) 40 MG tablet Take 1 tablet (40 mg total) by mouth at bedtime.   mirabegron ER (MYRBETRIQ) 50 MG TB24 tablet Take 50 mg by  mouth 3 (three) times a week.   Multiple Vitamins-Minerals (PRESERVISION AREDS PO) Take by mouth daily.   Multiple Vitamins-Minerals (WOMENS MULTIVITAMIN PLUS PO) Take 1 tablet by mouth daily.   NON FORMULARY Post Surgical Bras   NON FORMULARY Non-Silicone Breast Prosthesis   simvastatin (ZOCOR) 20 MG tablet TAKE 1 TABLET BY MOUTH EVERYDAY AT BEDTIME   timolol (BETIMOL) 0.5 % ophthalmic solution Place 1 drop into both eyes 2 (two) times  daily.   tobramycin (TOBREX) 0.3 % ophthalmic ointment Place 1 application into the left eye as needed.   No facility-administered encounter medications on file as of 02/26/2023.  Fill History:  Dispensed Days Supply Quantity Provider Pharmacy  ATENOLOL-CHLORTHALIDONE 50-25 MG TABLET 12/29/2022 90 90 tablet      Dispensed Days Supply Quantity Provider Pharmacy  IPRATROPIUM BROMIDE 0.03% SOLUTION 10/03/2021 86 60 each      Dispensed Days Supply Quantity Provider Pharmacy  LEVOTHYROXINE 50 MCG TABLET 12/29/2022 90 90 tablet      Dispensed Days Supply Quantity Provider Pharmacy  LISINOPRIL 40 MG TABLET 11/25/2022 90 90 tablet      Dispensed Days Supply Quantity Provider Pharmacy  SIMVASTATIN 20 MG TABLET 01/06/2023 90 90 tablet     Recent Office Vitals: BP Readings from Last 3 Encounters:  02/09/23 122/60  12/31/22 128/66  10/12/22 118/60   Pulse Readings from Last 3 Encounters:  02/09/23 63  12/31/22 96  10/12/22 (!) 59    Wt Readings from Last 3 Encounters:  02/09/23 96 lb (43.5 kg)  10/12/22 102 lb 8 oz (46.5 kg)  08/31/22 102 lb 12.8 oz (46.6 kg)     Kidney Function Lab Results  Component Value Date/Time   CREATININE 1.60 (H) 08/31/2022 11:51 AM   CREATININE 1.45 (H) 01/14/2022 10:37 AM   CREATININE 1.7 (H) 12/19/2012 08:17 AM   GFR 27.53 (L) 08/31/2022 11:51 AM   GFRNONAA 42 (L) 06/09/2010 09:00 AM   GFRAA (L) 06/09/2010 09:00 AM    51        The eGFR has been calculated using the MDRD equation. This calculation has not been validated in all clinical situations. eGFR's persistently <60 mL/min signify possible Chronic Kidney Disease.       Latest Ref Rng & Units 08/31/2022   11:51 AM 01/14/2022   10:37 AM 07/14/2021    1:24 PM  BMP  Glucose 70 - 99 mg/dL 87  84  97   BUN 6 - 23 mg/dL 31  26  23    Creatinine 0.40 - 1.20 mg/dL 4.31  5.40  0.86   Sodium 135 - 145 mEq/L 141  140  141   Potassium 3.5 - 5.1 mEq/L 3.9  4.5  4.1   Chloride 96 - 112 mEq/L 103   104  101   CO2 19 - 32 mEq/L 29  29  27    Calcium 8.4 - 10.5 mg/dL 9.9  76.1  9.9    Inetta Fermo [facility]  Clinical Pharmacist Assistant [phone]

## 2023-03-09 ENCOUNTER — Other Ambulatory Visit: Payer: Self-pay | Admitting: *Deleted

## 2023-03-09 DIAGNOSIS — I10 Essential (primary) hypertension: Secondary | ICD-10-CM

## 2023-03-09 MED ORDER — LISINOPRIL 40 MG PO TABS: 40.0000 mg | ORAL_TABLET | Freq: Every day | ORAL | 1 refills | Status: DC

## 2023-03-09 NOTE — Telephone Encounter (Signed)
Rx done. 

## 2023-03-29 ENCOUNTER — Encounter: Payer: Self-pay | Admitting: Podiatry

## 2023-03-29 ENCOUNTER — Ambulatory Visit (INDEPENDENT_AMBULATORY_CARE_PROVIDER_SITE_OTHER): Payer: HMO | Admitting: Podiatry

## 2023-03-29 DIAGNOSIS — M79675 Pain in left toe(s): Secondary | ICD-10-CM

## 2023-03-29 DIAGNOSIS — N182 Chronic kidney disease, stage 2 (mild): Secondary | ICD-10-CM

## 2023-03-29 DIAGNOSIS — M79674 Pain in right toe(s): Secondary | ICD-10-CM | POA: Diagnosis not present

## 2023-03-29 DIAGNOSIS — B351 Tinea unguium: Secondary | ICD-10-CM | POA: Diagnosis not present

## 2023-03-29 NOTE — Progress Notes (Signed)
This patient returns to my office for at risk foot care.  This patient requires this care by a professional since this patient will be at risk due to having CKD.   This patient presents to the office with her daughter.  This patient is unable to cut nails herself since the patient cannot reach her nails.These nails are painful walking and wearing shoes.  This patient presents for at risk foot care today.  General Appearance  Alert, conversant and in no acute stress.  Vascular  Dorsalis pedis and posterior tibial  pulses are  weakly palpable  bilaterally.  Capillary return is within normal limits  bilaterally. Cold feet bilaterally.  Neurologic  Senn-Weinstein monofilament wire test within normal limits  bilaterally. Muscle power within normal limits bilaterally.  Nails Thick disfigured discolored nails with subungual debris  hallux toenails bilaterally. No evidence of bacterial infection or drainage bilaterally.  Orthopedic  No limitations of motion  feet .  No crepitus or effusions noted.  No bony pathology or digital deformities noted.  Skin  normotropic skin with no porokeratosis noted bilaterally.  No signs of infections or ulcers noted.     Onychomycosis  Pain in right toes  Pain in left toes  Consent was obtained for treatment procedures.   Mechanical debridement of nails 1-5  bilaterally performed with a nail nipper.  Filed with dremel without incident.    Return office visit   6  months                   Told patient to return for periodic foot care and evaluation due to potential at risk complications.   Maleta Pacha DPM   

## 2023-03-30 DIAGNOSIS — H401132 Primary open-angle glaucoma, bilateral, moderate stage: Secondary | ICD-10-CM | POA: Diagnosis not present

## 2023-03-30 DIAGNOSIS — H348312 Tributary (branch) retinal vein occlusion, right eye, stable: Secondary | ICD-10-CM | POA: Diagnosis not present

## 2023-03-30 DIAGNOSIS — H3582 Retinal ischemia: Secondary | ICD-10-CM | POA: Diagnosis not present

## 2023-03-30 DIAGNOSIS — H34832 Tributary (branch) retinal vein occlusion, left eye, with macular edema: Secondary | ICD-10-CM | POA: Diagnosis not present

## 2023-03-30 DIAGNOSIS — H43813 Vitreous degeneration, bilateral: Secondary | ICD-10-CM | POA: Diagnosis not present

## 2023-03-30 DIAGNOSIS — H59812 Chorioretinal scars after surgery for detachment, left eye: Secondary | ICD-10-CM | POA: Diagnosis not present

## 2023-04-12 ENCOUNTER — Encounter: Payer: Self-pay | Admitting: Family Medicine

## 2023-04-12 ENCOUNTER — Ambulatory Visit (INDEPENDENT_AMBULATORY_CARE_PROVIDER_SITE_OTHER): Payer: HMO | Admitting: Family Medicine

## 2023-04-12 VITALS — BP 100/58 | HR 61 | Temp 97.6°F | Ht <= 58 in | Wt 95.1 lb

## 2023-04-12 DIAGNOSIS — E039 Hypothyroidism, unspecified: Secondary | ICD-10-CM | POA: Diagnosis not present

## 2023-04-12 DIAGNOSIS — I10 Essential (primary) hypertension: Secondary | ICD-10-CM

## 2023-04-12 DIAGNOSIS — E785 Hyperlipidemia, unspecified: Secondary | ICD-10-CM | POA: Diagnosis not present

## 2023-04-12 LAB — COMPREHENSIVE METABOLIC PANEL
ALT: 7 U/L (ref 0–35)
AST: 16 U/L (ref 0–37)
Albumin: 4.3 g/dL (ref 3.5–5.2)
Alkaline Phosphatase: 55 U/L (ref 39–117)
BUN: 22 mg/dL (ref 6–23)
CO2: 25 mEq/L (ref 19–32)
Calcium: 9.9 mg/dL (ref 8.4–10.5)
Chloride: 103 mEq/L (ref 96–112)
Creatinine, Ser: 1.48 mg/dL — ABNORMAL HIGH (ref 0.40–1.20)
GFR: 30.1 mL/min — ABNORMAL LOW (ref >60.00)
Glucose, Bld: 87 mg/dL (ref 70–99)
Potassium: 3.9 mEq/L (ref 3.5–5.1)
Sodium: 140 mEq/L (ref 135–145)
Total Bilirubin: 0.5 mg/dL (ref 0.2–1.2)
Total Protein: 7.7 g/dL (ref 6.0–8.3)

## 2023-04-12 LAB — LIPID PANEL
Cholesterol: 141 mg/dL (ref 0–200)
HDL: 56 mg/dL (ref >39.00)
LDL Cholesterol: 66 mg/dL (ref 0–99)
NonHDL: 85.32
Total CHOL/HDL Ratio: 3
Triglycerides: 95 mg/dL (ref 0.0–149.0)
VLDL: 19 mg/dL (ref 0.0–40.0)

## 2023-04-12 MED ORDER — AMLODIPINE BESYLATE 10 MG PO TABS: 10.0000 mg | ORAL_TABLET | Freq: Every day | ORAL | 1 refills | Status: DC

## 2023-04-12 NOTE — Assessment & Plan Note (Signed)
On simvastatin 20 mg daily, will continue this, checking new lipid panel today

## 2023-04-12 NOTE — Progress Notes (Signed)
Established Patient Office Visit  Subjective   Patient ID: Alison Cordova, female    DOB: 1926-06-29  Age: 89 y.o. MRN: [id number]  Chief Complaint  Patient presents with   Medical Management of Chronic Issues    Pt is here for follow up today. She is here with her caregiver who is giving the history. Caregiver reports that her appetite is still low, states she eats 1 egg and toast at breakfast, then just nibbles throughout the day, she does like to eat sugar but has trouble getting enough protein in, weight is 95 pounds today, was 96 in march. We discussed the possibility of starting medication.   HTN -- BP in office performed and is well controlled. She  reports no side effects to the medications, no chest pain, SOB, dizziness or headaches. She has a BP cuff at home and is checking BP regularly, reports they are in the normal range.   Hypothyroidism-- pt seems to be doing well, needs new TSH today, on 50 mcg daily of levothyroxine. No new symptoms reported today.       Current Outpatient Medications  Medication Instructions   acetaminophen (TYLENOL) 650 mg, Oral, Every 8 hours PRN,     amLODipine (NORVASC) 10 mg, Oral, Daily   aspirin EC 81 mg, Oral, Daily,     atenolol-chlorthalidone (TENORETIC) 50-25 MG tablet 1 tablet, Oral, Every morning   cetirizine (ZYRTEC) 10 mg, Oral, Daily   fluticasone (FLONASE) 50 MCG/ACT nasal spray 1 spray, Each Nare, 2 times daily   ipratropium (ATROVENT) 0.03 % nasal spray 2 sprays, Each Nare, Every 12 hours   latanoprost (XALATAN) 0.005 % ophthalmic solution 1 drop, Both Eyes, Daily at bedtime,     levothyroxine (SYNTHROID) 50 mcg, Oral, Daily   lisinopril (ZESTRIL) 40 mg, Oral, Daily at bedtime   mirabegron ER (MYRBETRIQ) 50 mg, Oral, 3 times weekly   Multiple Vitamins-Minerals (PRESERVISION AREDS PO) Oral, Daily   Multiple Vitamins-Minerals (WOMENS MULTIVITAMIN PLUS PO) 1 tablet, Oral, Daily,     NON FORMULARY Post Surgical Bras    NON FORMULARY  Non-Silicone Breast Prosthesis    simvastatin (ZOCOR) 20 MG tablet TAKE 1 TABLET BY MOUTH EVERYDAY AT BEDTIME   timolol (BETIMOL) 0.5 % ophthalmic solution 1 drop, 2 times daily   tobramycin (TOBREX) 0.3 % ophthalmic ointment 1 application , As needed    Patient Active Problem List   Diagnosis Date Noted   Body mass index (BMI) of 19.0-19.9 in adult 08/14/2022   Hyperlipemia 03/27/2016   Anemia 06/17/2012   CKD (chronic kidney disease) stage 3, GFR 30-59 ml/min (HCC) 06/17/2012   Cerebrovascular disease 06/27/2010   Malignant neoplasm of female breast (HCC) 02/19/2010   Glaucoma 09/02/2008   Hypothyroidism 03/14/2008   Allergic rhinitis 03/14/2008   Urinary incontinence 03/14/2008   Essential hypertension 09/17/2007   GERD 09/17/2007      Review of Systems  All other systems reviewed and are negative.     Objective:     BP (!) 100/58 (BP Location: Right Arm, Patient Position: Sitting, Cuff Size: Normal)   Pulse 61   Temp 97.6 F (36.4 C) (Oral)   Ht 4' 8.5" (1.435 m)   Wt 95 lb 1.6 oz (43.1 kg)   SpO2 97%   BMI 20.95 kg/m    Physical Exam Vitals reviewed.  Constitutional:      Appearance: Normal appearance. She is well-groomed and normal weight.  Eyes:     Conjunctiva/sclera: Conjunctivae normal.  Neck:  Thyroid: No thyromegaly.  Cardiovascular:     Rate and Rhythm: Normal rate and regular rhythm.     Pulses: Normal pulses.     Heart sounds: S1 normal and S2 normal.  Pulmonary:     Effort: Pulmonary effort is normal.     Breath sounds: Normal breath sounds and air entry.  Abdominal:     General: Bowel sounds are normal.  Musculoskeletal:     Right lower leg: No edema.     Left lower leg: No edema.  Neurological:     Mental Status: She is alert. Mental status is at baseline.     Gait: Gait is intact.     Comments: Follows commands, does not speak much but will answer questions when prompted. Oriented to person and place  Psychiatric:        Mood  and Affect: Mood normal. Affect is flat.        Speech: Speech normal.        Behavior: Behavior normal.      No results found for any visits on 04/12/23.    The ASCVD Risk score (Arnett DK, et al., 2019) failed to calculate for the following reasons:   The 2019 ASCVD risk score is only valid for ages 14 to 8   The patient has a prior MI or stroke diagnosis    Assessment & Plan:  Hyperlipidemia, unspecified hyperlipidemia type Assessment & Plan: On simvastatin 20 mg daily, will continue this, checking new lipid panel today  Orders: -     Lipid panel  Essential hypertension Assessment & Plan: Current hypertension medications:       Sig   atenolol-chlorthalidone (TENORETIC) 50-25 MG tablet (Taking) Take 1 tablet by mouth every morning.   lisinopril (ZESTRIL) 40 MG tablet (Taking) Take 1 tablet (40 mg total) by mouth at bedtime.   amLODipine (NORVASC) 10 MG tablet Take 1 tablet (10 mg total) by mouth daily.      BP is well controlled today on the above medications, will continue   Orders: -     amLODIPine Besylate; Take 1 tablet (10 mg total) by mouth daily.  Dispense: 90 tablet; Refill: 1 -     Comprehensive metabolic panel  Hypothyroidism, unspecified type Assessment & Plan: Needs new TSH today, on 50 mcg daily of levothyroxine  Orders: -     TSH   The decrease appetite could be a result of the normal aging process, we discussed this at length, caregiver wants to wait and see if her weight remains stable at this point.  Return in about 6 months (around 10/13/2023) for HTN.    Karie Georges, MD

## 2023-04-12 NOTE — Assessment & Plan Note (Signed)
Current hypertension medications:       Sig   atenolol-chlorthalidone (TENORETIC) 50-25 MG tablet (Taking) Take 1 tablet by mouth every morning.   lisinopril (ZESTRIL) 40 MG tablet (Taking) Take 1 tablet (40 mg total) by mouth at bedtime.   amLODipine (NORVASC) 10 MG tablet Take 1 tablet (10 mg total) by mouth daily.      BP is well controlled today on the above medications, will continue

## 2023-04-12 NOTE — Assessment & Plan Note (Signed)
Needs new TSH today, on 50 mcg daily of levothyroxine

## 2023-04-13 DIAGNOSIS — R350 Frequency of micturition: Secondary | ICD-10-CM | POA: Diagnosis not present

## 2023-04-13 DIAGNOSIS — N3946 Mixed incontinence: Secondary | ICD-10-CM | POA: Diagnosis not present

## 2023-04-13 LAB — TSH: TSH: 3.47 u[IU]/mL (ref 0.35–5.50)

## 2023-05-26 ENCOUNTER — Telehealth: Payer: Self-pay | Admitting: Family Medicine

## 2023-05-26 NOTE — Telephone Encounter (Signed)
Left a detailed message with the approval as below on the voicemail of [facility].

## 2023-05-26 NOTE — Telephone Encounter (Signed)
[facility] calling to get an okay to use a different manufacturer for the Levothyroxine Rx Refill.  MD has already left for the day.  Please advise.   [phone]

## 2023-06-22 DIAGNOSIS — H59812 Chorioretinal scars after surgery for detachment, left eye: Secondary | ICD-10-CM | POA: Diagnosis not present

## 2023-06-22 DIAGNOSIS — H43813 Vitreous degeneration, bilateral: Secondary | ICD-10-CM | POA: Diagnosis not present

## 2023-06-22 DIAGNOSIS — H34832 Tributary (branch) retinal vein occlusion, left eye, with macular edema: Secondary | ICD-10-CM | POA: Diagnosis not present

## 2023-06-22 DIAGNOSIS — H3582 Retinal ischemia: Secondary | ICD-10-CM | POA: Diagnosis not present

## 2023-06-22 DIAGNOSIS — H348312 Tributary (branch) retinal vein occlusion, right eye, stable: Secondary | ICD-10-CM | POA: Diagnosis not present

## 2023-06-22 DIAGNOSIS — H401132 Primary open-angle glaucoma, bilateral, moderate stage: Secondary | ICD-10-CM | POA: Diagnosis not present

## 2023-08-11 ENCOUNTER — Telehealth: Payer: Self-pay | Admitting: Family Medicine

## 2023-08-11 DIAGNOSIS — I10 Essential (primary) hypertension: Secondary | ICD-10-CM

## 2023-08-11 DIAGNOSIS — E039 Hypothyroidism, unspecified: Secondary | ICD-10-CM

## 2023-08-11 DIAGNOSIS — E785 Hyperlipidemia, unspecified: Secondary | ICD-10-CM

## 2023-08-11 MED ORDER — ATENOLOL-CHLORTHALIDONE 50-25 MG PO TABS
1.0000 | ORAL_TABLET | Freq: Every morning | ORAL | 1 refills | Status: DC
Start: 2023-08-11 — End: 2024-01-11

## 2023-08-11 MED ORDER — LISINOPRIL 40 MG PO TABS
40.0000 mg | ORAL_TABLET | Freq: Every day | ORAL | 1 refills | Status: DC
Start: 2023-08-11 — End: 2024-01-19

## 2023-08-11 MED ORDER — LEVOTHYROXINE SODIUM 50 MCG PO TABS
50.0000 ug | ORAL_TABLET | Freq: Every day | ORAL | 1 refills | Status: DC
Start: 2023-08-11 — End: 2024-01-18

## 2023-08-11 MED ORDER — SIMVASTATIN 20 MG PO TABS
ORAL_TABLET | ORAL | 1 refills | Status: DC
Start: 2023-08-11 — End: 2023-08-11

## 2023-08-11 MED ORDER — SIMVASTATIN 20 MG PO TABS
ORAL_TABLET | ORAL | 0 refills | Status: DC
Start: 2023-08-11 — End: 2024-01-19

## 2023-08-11 MED ORDER — AMLODIPINE BESYLATE 10 MG PO TABS
10.0000 mg | ORAL_TABLET | Freq: Every day | ORAL | 1 refills | Status: DC
Start: 2023-08-11 — End: 2024-01-11

## 2023-08-11 NOTE — Telephone Encounter (Signed)
Prescription Request  08/11/2023  LOV: 04/12/2023  Daughter states Pt is completely out of the:  simvastatin (ZOCOR) 20 MG tablet --   Please send that refill to:   [id number] [place], [place] - [address] [phone] [address] [place] [address]   ALSO - Please send refills for:  Amlodipine (NORVASC) 10 MG tablet   atenolol-chlorthalidone (TENORETIC) 50-25 MG tablet  lisinopril (ZESTRIL) 40 MG tablet  levothyroxine (SYNTHROID) 50 MCG tablet  VIA:  [facility], [place] - [address] [place] [address] [place] [address] Phone: [phone] Fax: [phone]

## 2023-08-11 NOTE — Telephone Encounter (Signed)
Rx done. 

## 2023-08-11 NOTE — Addendum Note (Signed)
Addended by: Johnella Moloney on: 08/11/2023 02:26 PM   Modules accepted: Orders

## 2023-09-14 DIAGNOSIS — H34832 Tributary (branch) retinal vein occlusion, left eye, with macular edema: Secondary | ICD-10-CM | POA: Diagnosis not present

## 2023-09-14 DIAGNOSIS — H3582 Retinal ischemia: Secondary | ICD-10-CM | POA: Diagnosis not present

## 2023-09-14 DIAGNOSIS — H43813 Vitreous degeneration, bilateral: Secondary | ICD-10-CM | POA: Diagnosis not present

## 2023-09-14 DIAGNOSIS — H59812 Chorioretinal scars after surgery for detachment, left eye: Secondary | ICD-10-CM | POA: Diagnosis not present

## 2023-09-14 DIAGNOSIS — H348312 Tributary (branch) retinal vein occlusion, right eye, stable: Secondary | ICD-10-CM | POA: Diagnosis not present

## 2023-09-14 DIAGNOSIS — H401132 Primary open-angle glaucoma, bilateral, moderate stage: Secondary | ICD-10-CM | POA: Diagnosis not present

## 2023-09-29 ENCOUNTER — Ambulatory Visit (INDEPENDENT_AMBULATORY_CARE_PROVIDER_SITE_OTHER): Payer: HMO | Admitting: Podiatry

## 2023-09-29 ENCOUNTER — Encounter: Payer: Self-pay | Admitting: Podiatry

## 2023-09-29 DIAGNOSIS — B351 Tinea unguium: Secondary | ICD-10-CM | POA: Diagnosis not present

## 2023-09-29 DIAGNOSIS — M79674 Pain in right toe(s): Secondary | ICD-10-CM

## 2023-09-29 DIAGNOSIS — M79675 Pain in left toe(s): Secondary | ICD-10-CM | POA: Diagnosis not present

## 2023-09-29 DIAGNOSIS — N182 Chronic kidney disease, stage 2 (mild): Secondary | ICD-10-CM

## 2023-09-29 NOTE — Progress Notes (Signed)
This patient returns to my office for at risk foot care.  This patient requires this care by a professional since this patient will be at risk due to having CKD.   This patient presents to the office with her daughter.  This patient is unable to cut nails herself since the patient cannot reach her nails.These nails are painful walking and wearing shoes.  This patient presents for at risk foot care today.  General Appearance  Alert, conversant and in no acute stress.  Vascular  Dorsalis pedis and posterior tibial  pulses are  weakly palpable  bilaterally.  Capillary return is within normal limits  bilaterally. Cold feet bilaterally.  Neurologic  Senn-Weinstein monofilament wire test within normal limits  bilaterally. Muscle power within normal limits bilaterally.  Nails Thick disfigured discolored nails with subungual debris  hallux toenails bilaterally. No evidence of bacterial infection or drainage bilaterally.  Orthopedic  No limitations of motion  feet .  No crepitus or effusions noted.  No bony pathology or digital deformities noted.  Skin  normotropic skin with no porokeratosis noted bilaterally.  No signs of infections or ulcers noted.     Onychomycosis  Pain in right toes  Pain in left toes  Consent was obtained for treatment procedures.   Mechanical debridement of nails 1-5  bilaterally performed with a nail nipper.  Filed with dremel without incident.    Return office visit   6  months                   Told patient to return for periodic foot care and evaluation due to potential at risk complications.   Gardiner Barefoot DPM

## 2023-10-14 ENCOUNTER — Encounter: Payer: Self-pay | Admitting: Family Medicine

## 2023-10-14 ENCOUNTER — Ambulatory Visit: Payer: HMO | Admitting: Family Medicine

## 2023-10-14 VITALS — BP 118/60 | HR 60 | Temp 97.5°F | Ht <= 58 in | Wt 95.2 lb

## 2023-10-14 DIAGNOSIS — R42 Dizziness and giddiness: Secondary | ICD-10-CM | POA: Diagnosis not present

## 2023-10-14 DIAGNOSIS — R296 Repeated falls: Secondary | ICD-10-CM | POA: Diagnosis not present

## 2023-10-14 DIAGNOSIS — N1832 Chronic kidney disease, stage 3b: Secondary | ICD-10-CM

## 2023-10-14 DIAGNOSIS — I10 Essential (primary) hypertension: Secondary | ICD-10-CM | POA: Diagnosis not present

## 2023-10-14 DIAGNOSIS — E039 Hypothyroidism, unspecified: Secondary | ICD-10-CM | POA: Diagnosis not present

## 2023-10-14 LAB — BASIC METABOLIC PANEL
BUN: 31 mg/dL — ABNORMAL HIGH (ref 6–23)
CO2: 20 meq/L (ref 19–32)
Calcium: 10 mg/dL (ref 8.4–10.5)
Chloride: 103 meq/L (ref 96–112)
Creatinine, Ser: 1.55 mg/dL — ABNORMAL HIGH (ref 0.40–1.20)
GFR: 28.38 mL/min — ABNORMAL LOW (ref >60.00)
Glucose, Bld: 71 mg/dL (ref 70–99)
Potassium: 4.4 meq/L (ref 3.5–5.1)
Sodium: 138 meq/L (ref 135–145)

## 2023-10-14 LAB — TSH: TSH: 3.63 u[IU]/mL (ref 0.35–5.50)

## 2023-10-14 NOTE — Progress Notes (Signed)
Established Patient Office Visit  Subjective   Patient ID: Alison Cordova, female    DOB: 1926-01-28  Age: 89 y.o. MRN: [id number]  Chief Complaint  Patient presents with   Medical Management of Chronic Issues    Pt is here for 6 month follow up with her caregiver today.  Caregiver reports that she had a fall on 11/11, caregiver had taken her blood pressure and it was 159/68, then two days later she said she felt dizzy and BP at that time was 150/65. She reports that she won't use her walker to steady herself when she gets out of bed. States that she does have an adjustable bed  that is not too far from the ground. We discussed ordering home health for safety evaluation and rehab and she is agreeable. I recheck her BP in the office today and it is 138/58.     Current Outpatient Medications  Medication Instructions   acetaminophen (TYLENOL) 650 mg, Oral, Every 8 hours PRN,     amLODipine (NORVASC) 10 mg, Oral, Daily   aspirin EC 81 mg, Oral, Daily,     atenolol-chlorthalidone (TENORETIC) 50-25 MG tablet 1 tablet, Oral, Every morning   cetirizine (ZYRTEC) 10 mg, Oral, Daily   fluticasone (FLONASE) 50 MCG/ACT nasal spray 1 spray, Each Nare, 2 times daily   ipratropium (ATROVENT) 0.03 % nasal spray 2 sprays, Each Nare, Every 12 hours   latanoprost (XALATAN) 0.005 % ophthalmic solution 1 drop, Both Eyes, Daily at bedtime,     levothyroxine (SYNTHROID) 50 mcg, Oral, Daily   lisinopril (ZESTRIL) 40 mg, Oral, Daily at bedtime   mirabegron ER (MYRBETRIQ) 50 mg, Oral, 3 times weekly   Multiple Vitamins-Minerals (PRESERVISION AREDS PO) Oral, Daily   Multiple Vitamins-Minerals (WOMENS MULTIVITAMIN PLUS PO) 1 tablet, Oral, Daily,     NON FORMULARY Post Surgical Bras    NON FORMULARY Non-Silicone Breast Prosthesis    simvastatin (ZOCOR) 20 MG tablet TAKE 1 TABLET BY MOUTH EVERYDAY AT BEDTIME   timolol (BETIMOL) 0.5 % ophthalmic solution 1 drop, 2 times daily   tobramycin (TOBREX) 0.3 %  ophthalmic ointment 1 application , As needed    Patient Active Problem List   Diagnosis Date Noted   Body mass index (BMI) of 19.0-19.9 in adult 08/14/2022   Hyperlipemia 03/27/2016   Anemia 06/17/2012   CKD (chronic kidney disease) stage 3, GFR 30-59 ml/min (HCC) 06/17/2012   Cerebrovascular disease 06/27/2010   Malignant neoplasm of female breast (HCC) 02/19/2010   Glaucoma 09/02/2008   Hypothyroidism 03/14/2008   Allergic rhinitis 03/14/2008   Urinary incontinence 03/14/2008   Essential hypertension 09/17/2007   GERD 09/17/2007      Review of Systems  All other systems reviewed and are negative.     Objective:     BP 118/60 (BP Location: Right Arm, Patient Position: Sitting, Cuff Size: Normal)   Pulse 60   Temp (!) 97.5 F (36.4 C) (Oral)   Ht 4' 8.5" (1.435 m)   Wt 95 lb 3.2 oz (43.2 kg)   SpO2 98%   BMI 20.97 kg/m    Physical Exam Vitals reviewed.  Constitutional:      Appearance: Normal appearance. She is normal weight.  Cardiovascular:     Rate and Rhythm: Normal rate and regular rhythm.     Heart sounds: Normal heart sounds. No murmur heard. Pulmonary:     Effort: Pulmonary effort is normal.     Breath sounds: Normal breath sounds.  Neurological:  General: No focal deficit present.     Mental Status: She is alert. Mental status is at baseline. She is disoriented.  Psychiatric:        Mood and Affect: Mood normal. Affect is flat.        Behavior: Behavior normal. Behavior is cooperative.      No results found for any visits on 10/14/23.    The ASCVD Risk score (Arnett DK, et al., 2019) failed to calculate for the following reasons:   The 2019 ASCVD risk score is only valid for ages 60 to 75   The patient has a prior MI or stroke diagnosis    Assessment & Plan:  Hypothyroidism, unspecified type Assessment & Plan: Needs new TSH today, on 50 mcg daily of levothyroxine  Orders: -     TSH  Stage 3b chronic kidney disease  (HCC) Assessment & Plan: Last Cr was stable from previous, needs BMP today for surveillance since she is having higher BP readings at home.   Orders: -     Basic metabolic panel  Frequent falls Larey Seat out of bed the other day, also complained of dizziness at one point, will order home health for physical therapy to help reduce the risk of falls at home. I observed her gait at the conclusion of the visit and it appears steady, not shuffling, she is picking up her toes well, no foot drop. She sometimes places her left foot a little too far medially but she recovers well.  -     Ambulatory referral to Home Health  Dizziness -     Ambulatory referral to Home Health  Essential hypertension Assessment & Plan: Current hypertension medications:       Sig   amLODipine (NORVASC) 10 MG tablet (Taking) Take 1 tablet (10 mg total) by mouth daily.   atenolol-chlorthalidone (TENORETIC) 50-25 MG tablet (Taking) Take 1 tablet by mouth every morning.   lisinopril (ZESTRIL) 40 MG tablet (Taking) Take 1 tablet (40 mg total) by mouth at bedtime.      BP is well controlled today on the above medications, will continue above medications.      Return in about 6 months (around 04/12/2024) for annual physical exam.    Karie Georges, MD

## 2023-10-14 NOTE — Assessment & Plan Note (Signed)
Last Cr was stable from previous, needs BMP today for surveillance since she is having higher BP readings at home.

## 2023-10-14 NOTE — Assessment & Plan Note (Signed)
Needs new TSH today, on 50 mcg daily of levothyroxine

## 2023-10-14 NOTE — Assessment & Plan Note (Signed)
Current hypertension medications:       Sig   amLODipine (NORVASC) 10 MG tablet (Taking) Take 1 tablet (10 mg total) by mouth daily.   atenolol-chlorthalidone (TENORETIC) 50-25 MG tablet (Taking) Take 1 tablet by mouth every morning.   lisinopril (ZESTRIL) 40 MG tablet (Taking) Take 1 tablet (40 mg total) by mouth at bedtime.      BP is well controlled today on the above medications, will continue above medications.

## 2023-10-15 ENCOUNTER — Telehealth: Payer: Self-pay | Admitting: Family Medicine

## 2023-10-15 DIAGNOSIS — Z8673 Personal history of transient ischemic attack (TIA), and cerebral infarction without residual deficits: Secondary | ICD-10-CM | POA: Diagnosis not present

## 2023-10-15 DIAGNOSIS — I129 Hypertensive chronic kidney disease with stage 1 through stage 4 chronic kidney disease, or unspecified chronic kidney disease: Secondary | ICD-10-CM | POA: Diagnosis not present

## 2023-10-15 DIAGNOSIS — I10 Essential (primary) hypertension: Secondary | ICD-10-CM | POA: Diagnosis not present

## 2023-10-15 DIAGNOSIS — Z853 Personal history of malignant neoplasm of breast: Secondary | ICD-10-CM | POA: Diagnosis not present

## 2023-10-15 DIAGNOSIS — Z7982 Long term (current) use of aspirin: Secondary | ICD-10-CM | POA: Diagnosis not present

## 2023-10-15 DIAGNOSIS — Z604 Social exclusion and rejection: Secondary | ICD-10-CM | POA: Diagnosis not present

## 2023-10-15 DIAGNOSIS — D631 Anemia in chronic kidney disease: Secondary | ICD-10-CM | POA: Diagnosis not present

## 2023-10-15 DIAGNOSIS — R296 Repeated falls: Secondary | ICD-10-CM | POA: Diagnosis not present

## 2023-10-15 DIAGNOSIS — Z9181 History of falling: Secondary | ICD-10-CM | POA: Diagnosis not present

## 2023-10-15 DIAGNOSIS — E039 Hypothyroidism, unspecified: Secondary | ICD-10-CM | POA: Diagnosis not present

## 2023-10-15 DIAGNOSIS — Z79899 Other long term (current) drug therapy: Secondary | ICD-10-CM | POA: Diagnosis not present

## 2023-10-15 DIAGNOSIS — N1832 Chronic kidney disease, stage 3b: Secondary | ICD-10-CM | POA: Diagnosis not present

## 2023-10-15 NOTE — Telephone Encounter (Signed)
Ok to send verbal orders

## 2023-10-15 NOTE — Telephone Encounter (Signed)
Left a detailed message at Brian's voicemail with the approval for orders as below.

## 2023-10-15 NOTE — Telephone Encounter (Signed)
[facility] would like [facility] for PT  2x4 , 1x4

## 2023-12-30 ENCOUNTER — Ambulatory Visit: Payer: HMO | Admitting: Family Medicine

## 2023-12-30 DIAGNOSIS — Z0000 Encounter for general adult medical examination without abnormal findings: Secondary | ICD-10-CM | POA: Diagnosis not present

## 2023-12-30 NOTE — Progress Notes (Signed)
 PATIENT CHECK-IN and HEALTH RISK ASSESSMENT QUESTIONNAIRE:  -completed by phone/video for upcoming Medicare Preventive Visit  Pre-Visit Check-in: 1)Vitals (height, wt, BP, etc) - record in vitals section for visit on day of visit Request home vitals (wt, BP, etc.) and enter into vitals, THEN update Vital Signs SmartPhrase below at the top of the HPI. See below.  2)Review and Update Medications, Allergies PMH, Surgeries, Social history in Epic 3)Hospitalizations in the last year with date/reason?  no  4)Review and Update Care Team (patient's specialists) in Epic 5) Complete PHQ9 in Epic  6) Complete Fall Screening in Epic 7)Review all Health Maintenance Due and order under PCP if not done.  Medicare Wellness Patient Questionnaire:  Answer theses question about your habits: How often do you have a drink containing alcohol? Maybe one drink once a year Have you ever smoked? no On average, how many days per week do you engage in moderate to strenuous exercise (like a brisk walk)? Using pedal pusher at least 15 -30 minutes per day Eats late breakfast and dinner, she likes snacks  Beverages: clear black cherry drink - carbonated  Answer theses question about your everyday activities: Can you perform most household chores?no - has help - daughter does Are you deaf or have significant trouble hearing? Has hearing aid, poor hearing Do you feel that you have a problem with memory? Some, stable Do you feel safe at home? yes Last dentist visit? No teeth 8. Do you have any difficulty performing your everyday activities?yes Are you having any difficulty walking, taking medications on your own, and or difficulty managing daily home needs?daughter does everything Do you have difficulty walking or climbing stairs? Daughter helps Do you have difficulty dressing or bathing? Some, she does her own dressing Do you have difficulty doing errands alone such as visiting a doctor's office or shopping?daughter  helps Do you currently have any difficulty preparing food and eating? Daughter prepares meals Do you currently have any difficulty using the toilet?no Do you have any difficulty managing your finances?daughter does Do you have any difficulties with housekeeping of managing your housekeeping? Daughter does theses things   Do you have Advanced Directives in place (Living Will, Healthcare Power or Danielson)? yes   Last eye Exam and location? Goes to retinal doc and regular eye doc - Dr. Jarold   Do you currently use prescribed or non-prescribed narcotic or opioid pain medications? no   ----------------------------------------------------------------------------------------------------------------------------------------------------------------------------------------------------------------------  Because this visit was a virtual/telehealth visit, some criteria may be missing or patient reported. Any vitals not documented were not able to be obtained and vitals that have been documented are patient reported.    MEDICARE ANNUAL PREVENTIVE VISIT WITH PROVIDER: (Welcome to Medicare, initial annual wellness or annual wellness exam)  Virtual Visit via VPhone Note  I connected with LAJUANDA PENICK on 12/30/23 by phone  and verified that I am speaking with the correct person using two identifiers. They prefer to use a phone rather than a video.   Location patient: home Location provider:work or home office Persons participating in the virtual visit: patient, provider, daughter Devere  Concerns and/or follow up today: doing well, she had PT recently after a fall and feels much better. BP was around  120/60 when PT checked in about a week ago - has been running really good. Does not feel dizzy or lightheaded with standing.    See HM section in Epic for other details of completed HM.    ROS: negative for report of fevers,  unintentional weight loss, vision changes, vision loss, hearing loss or  change, chest pain, sob, hemoptysis, melena, hematochezia, hematuria, falls, bleeding or bruising, thoughts of suicide or self harm, memory loss  Patient-completed extensive health risk assessment - reviewed and discussed with the patient: See Health Risk Assessment completed with patient prior to the visit either above or in recent phone note. This was reviewed in detailed with the patient today and appropriate recommendations, orders and referrals were placed as needed per Summary below and patient instructions.   Review of Medical History: -PMH, PSH, Family History and current specialty and care providers reviewed and updated and listed below   Patient Care Team: Ozell Heron HERO, MD as PCP - General (Family Medicine) Obie Princella HERO, MD (Inactive) (Gastroenterology) Lonn Hicks, MD as Consulting Physician (Hematology and Oncology) Gaston Hamilton, MD as Consulting Physician (Urology) Jarold Mayo, MD as Consulting Physician (Ophthalmology) Liane Sharyne MATSU, [facility] (Inactive) as Pharmacist (Pharmacist)   Past Medical History:  Diagnosis Date   Allergic rhinitis    Anemia 06/17/2012   Anxiety and depression    Cataract 06/17/2012   Chronic kidney disease    CVA (cerebral vascular accident) [facility])    Diverticulosis 03/14/2008   Qualifier: Diagnosis of  By: Christi MD, Hamilton HERO    Glaucoma    Hemorrhoids 07/03/2011   Hx of breast cancer    Left   Hyperlipemia    Hyperlipidemia    Hypertension    Hypothyroid    Irritable bowel syndrome 09/17/2007   Qualifier: Diagnosis of  By: Nicholaus CMA, Tammy     Lung nodule 07/06/2011   Peripheral neuropathy    Urinary incontinence     Past Surgical History:  Procedure Laterality Date    FUNDIPLICATION     BREAST SURGERY     left breast mastectomy    EYE SURGERY     TO DESOLVE AN AREA IN HER LEFT EYE?   SPINE SURGERY     VAGINAL HYSTERECTOMY      Social History   Socioeconomic History   Marital status: Widowed    Spouse name: Not  on file   Number of children: 3   Years of education: Not on file   Highest education level: Some college, no degree  Occupational History   Not on file  Tobacco Use   Smoking status: Former    Current packs/day: 0.00    Types: Cigarettes    Quit date: 11/23/1962    Years since quitting: 61.1   Smokeless tobacco: Never  Substance and Sexual Activity   Alcohol use: Yes    Comment: PT STATES DRINKS A BEER OR WINE OCCASSIONALLY, NOT WEEKLY   Drug use: No   Sexual activity: Not on file  Other Topics Concern   Not on file  Social History Narrative   Work or School: none      Home Situation: lives with daughter and son and law      Spiritual Beliefs: [employer]an      Lifestyle: no regular exercise; diet is ok            Social Drivers of Corporate Investment Banker Strain: Low Risk  (10/07/2023)   Overall Financial Resource Strain (CARDIA)    Difficulty of Paying Living Expenses: Not hard at all  Food Insecurity: No Food Insecurity (10/07/2023)   Hunger Vital Sign    Worried About Running Out of Food in the Last Year: Never true    Ran Out of Food in  the Last Year: Never true  Transportation Needs: No Transportation Needs (10/07/2023)   PRAPARE - Administrator, Civil Service (Medical): No    Lack of Transportation (Non-Medical): No  Physical Activity: Unknown (10/07/2023)   Exercise Vital Sign    Days of Exercise per Week: 0 days    Minutes of Exercise per Session: Not on file  Stress: No Stress Concern Present (10/07/2023)   [employer] of Occupational Health - Occupational Stress Questionnaire    Feeling of Stress : Not at all  Social Connections: Socially Isolated (10/07/2023)   Social Connection and Isolation Panel [NHANES]    Frequency of Communication with Friends and Family: Never    Frequency of Social Gatherings with Friends and Family: Once a week    Attends Religious Services: 1 to 4 times per year    Active Member of [employer] or  Organizations: No    Attends Banker Meetings: Not on file    Marital Status: Widowed  Intimate Partner Violence: Not At Risk (12/30/2021)   Humiliation, Afraid, Rape, and Kick questionnaire    Fear of Current or Ex-Partner: No    Emotionally Abused: No    Physically Abused: No    Sexually Abused: No    No family history on file.  Current Outpatient Medications on File Prior to Visit  Medication Sig Dispense Refill   acetaminophen (TYLENOL) 650 MG CR tablet Take 650 mg by mouth every 8 (eight) hours as needed.     amLODipine  (NORVASC ) 10 MG tablet Take 1 tablet (10 mg total) by mouth daily. 90 tablet 1   aspirin EC 81 MG tablet Take 81 mg by mouth daily.     atenolol -chlorthalidone  (TENORETIC ) 50-25 MG tablet Take 1 tablet by mouth every morning. 90 tablet 1   cetirizine  (ZYRTEC ) 10 MG tablet Take 1 tablet (10 mg total) by mouth daily. 90 tablet 3   fluticasone  (FLONASE ) 50 MCG/ACT nasal spray Place 1 spray into both nostrils in the morning and at bedtime.     ipratropium (ATROVENT ) 0.03 % nasal spray Place 2 sprays into both nostrils every 12 (twelve) hours. 60 mL 1   latanoprost (XALATAN) 0.005 % ophthalmic solution Place 1 drop into both eyes at bedtime.     levothyroxine  (SYNTHROID ) 50 MCG tablet Take 1 tablet (50 mcg total) by mouth daily. 90 tablet 1   lisinopril  (ZESTRIL ) 40 MG tablet Take 1 tablet (40 mg total) by mouth at bedtime. 90 tablet 1   mirabegron  ER (MYRBETRIQ ) 50 MG TB24 tablet Take 50 mg by mouth 3 (three) times a week.     Multiple Vitamins-Minerals (PRESERVISION AREDS PO) Take by mouth daily.     Multiple Vitamins-Minerals (WOMENS MULTIVITAMIN PLUS PO) Take 1 tablet by mouth daily.     NON FORMULARY Post Surgical Bras     NON FORMULARY Non-Silicone Breast Prosthesis     simvastatin  (ZOCOR ) 20 MG tablet TAKE 1 TABLET BY MOUTH EVERYDAY AT BEDTIME 30 tablet 0   timolol (BETIMOL) 0.5 % ophthalmic solution Place 1 drop into both eyes 2 (two) times daily.      tobramycin (TOBREX) 0.3 % ophthalmic ointment Place 1 application into the left eye as needed.     No current facility-administered medications on file prior to visit.    No Known Allergies     Physical Exam Vitals requested from patient and listed below if patient had equipment and was able to obtain at home for this virtual visit: There were  no vitals filed for this visit. Estimated body mass index is 20.97 kg/m as calculated from the following:   Height as of 10/14/23: 4' 8.5 (1.435 m).   Weight as of 10/14/23: 95 lb 3.2 oz (43.2 kg).  EKG (optional): deferred due to virtual visit  GENERAL: alert, oriented, no acute distress detected, full vision exam deferred due to pandemic and/or virtual encounter  PSYCH/NEURO: pleasant and cooperative, no obvious depression or anxiety, speech and thought processing grossly intact, Cognitive function grossly intact  Flowsheet Row Video Visit from 12/31/2022 in [facility]  PHQ-9 Total Score 0           12/30/2023   10:52 AM 12/31/2022   11:07 AM 12/31/2022   10:53 AM 08/11/2022    3:05 PM 01/14/2022   10:39 AM  Depression screen PHQ 2/9  Decreased Interest 0 0 0  1  Down, Depressed, Hopeless 0 0 0 0 0  PHQ - 2 Score 0 0 0 0 1  Altered sleeping  0  1 1  Tired, decreased energy  0  3 1  Change in appetite    2 1  Feeling bad or failure about yourself   0  0 0  Trouble concentrating  0  0 0  Moving slowly or fidgety/restless  0   1  Suicidal thoughts  0  0 1  PHQ-9 Score  0  6 6  Difficult doing work/chores    Not difficult at all        12/30/2021   11:39 AM 12/31/2022   10:53 AM 10/07/2023   10:33 AM 10/14/2023    9:21 AM 12/30/2023   10:51 AM  Fall Risk  Falls in the past year? 0 0 1 1 1   Was there an injury with Fall? 0 0 0 0 0  Fall Risk Category Calculator 0 0 1  1 1   Fall Risk Category (Retired) Low      (RETIRED) Patient Fall Risk Level Low fall risk      Patient at Risk for Falls Due to  No Fall Risks   Impaired balance/gait   Fall risk Follow up    Falls evaluation completed Falls evaluation completed     Patient-reported     SUMMARY AND PLAN:  Encounter for Medicare annual wellness exam   Discussed applicable health maintenance/preventive health measures and advised and referred or ordered per patient preferences: -daughter reports the shingles vaccines and agrees to obtain record for PCP -they are considering the Tdap/discussed pros/cons and she plans to get at the pharmacy if decides to do -discussed bone density/mammo - declined -discussed updated covid immunization recs Health Maintenance  Topic Date Due   Zoster Vaccines- Shingrix (1 of 2) 08/24/1945   DTaP/Tdap/Td (2 - Tdap) 02/20/2020   COVID-19 Vaccine (6 - 2024-25 season) 10/26/2023   Medicare Annual Wellness (AWV)  12/29/2024   Pneumonia Vaccine 52+ Years old  Completed   INFLUENZA VACCINE  Completed   DEXA SCAN  Completed   HPV VACCINES  Aged Out   MAMMOGRAM  Discontinued      Education and counseling on the following was provided based on the above review of health and a plan/checklist for the patient, along with additional information discussed, was provided for the patient in the patient instructions :  -Provided counseling and plan for increased risk of falling if applicable per above screening. Provided safe balance exercises that can be done at home to improve balance and discussed  exercise guidelines for adults with include balance exercises at least 3 days per week.  -Advised and counseled on a healthy lifestyle - including the importance of a healthy diet, regular physical activity, social connections -Reviewed patient's current diet. Advised and counseled on a whole foods based healthy diet. A summary of a healthy diet was provided in the Patient Instructions. Sometimes her appetite is not great and only picks at meals - discussed adding clear boost after meals when she hasn't eaten well as  she has not liked the milky drink supplements.  -reviewed patient's current physical activity level and discussed exercise guidelines for adults. Discussed community resources and ideas for safe exercise at home to assist in meeting exercise guideline recommendations in a safe and healthy way. Discussed adding some small hand weights/chair exercises and getting up and around as much as possible with help/cane or walker and fall precautions.    Follow up: see patient instructions     Patient Instructions  I really enjoyed getting to talk with you today! I am available on Tuesdays and Thursdays for virtual visits if you have any questions or concerns, or if I can be of any further assistance.   CHECKLIST FROM ANNUAL WELLNESS VISIT:  -Follow up (please call to schedule if not scheduled after visit):   -yearly for annual wellness visit with primary care office  Here is a list of your preventive care/health maintenance measures and the plan for each if any are due:  PLAN For any measures below that may be due:  -can get vaccines at pharmacy  Health Maintenance  Topic Date Due   Zoster Vaccines- Shingrix (1 of 2) 08/24/1945   DTaP/Tdap/Td (2 - Tdap) 02/20/2020   COVID-19 Vaccine (6 - 2024-25 season) 10/26/2023   Medicare Annual Wellness (AWV)  12/29/2024   Pneumonia Vaccine 3+ Years old  Completed   INFLUENZA VACCINE  Completed   DEXA SCAN  Completed   HPV VACCINES  Aged Out   MAMMOGRAM  Discontinued    -See a dentist at least yearly  -Get your eyes checked and then per your eye specialist's recommendations  -Other issues addressed today:   -I have included below further information regarding a healthy whole foods based diet, physical activity guidelines for adults, stress management and opportunities for social connections. I hope you find this information useful.    -----------------------------------------------------------------------------------------------------------------------------------------------------------------------------------------------------------------------------------------------------------    NUTRITION: -eat real food: lots of colorful vegetables (half the plate) and fruits -5-7 servings of vegetables and fruits per day (fresh or steamed is best), exp. 2 servings of vegetables with lunch and dinner and 2 servings of fruit per day. Berries and greens such as kale and collards are great choices.  -consume on a regular basis:  fresh fruits, fresh veggies, fish, nuts, seeds, healthy oils (such as olive oil, avocado oil), whole grains (make sure first ingredient on label contains the word whole), -can eat small amounts of dairy and lean meat (no larger than the palm of your hand), but avoid processed meats such as ham, bacon, lunch meat, etc. -drink water -try to avoid fast food and pre-packaged foods, processed meat, ultra processed foods (donuts, candy, etc.) -most experts advise limiting sodium to < 2300mg  per day, should limit further is any chronic conditions such as high blood pressure, heart disease, diabetes, etc. The American Heart Association advised that < 1500mg  is is ideal -try to avoid foods that contain any ingredients with names you do not recognize  -try to avoid foods with added  sugar or sweeteners/sweets  -try to avoid sweet drinks -try to avoid Fiske rice, Krakowski bread, pasta (unless whole grain)  EXERCISE GUIDELINES FOR ADULTS: -if you wish to increase your physical activity, do so gradually and with the approval of your doctor -STOP and seek medical care immediately if you have any chest pain, chest discomfort or trouble breathing when starting or increasing exercise  -move and stretch your body, legs, feet and arms when sitting for long periods -Physical activity guidelines for optimal health in adults: -get  at least 150 minutes per week of moderate exercise (can talk, but not sing); this is about 20-30 minutes of sustained activity 5-7 days per week or two 10-15 minute episodes of sustained activity 5-7 days per week -do some muscle building/resistance training at least 2 days per week  -balance exercises 3+ days per week:   Stand somewhere where you have something sturdy to hold onto if you lose balance.    1) lift up on toes, start with 5x per day and work up to 20x   2) stand and lift on leg straight out to the side so that foot is a few inches of the floor, start with 5x each side and work up to 20x each side   3) stand on one foot, start with 5 seconds each side and work up to 20 seconds on each side  If you need ideas or help with getting more active:  -Silver sneakers https://tools.silversneakers.com  -Walk with a Doc: Http://www.duncan-williams.com/  -try to include resistance (weight lifting/strength building) and balance exercises twice per week: or the following link for ideas: http://castillo-powell.com/  buyducts.dk  STRESS MANAGEMENT: -can try meditating, or just sitting quietly with deep breathing while intentionally relaxing all parts of your body for 5 minutes daily -if you need further help with stress, anxiety or depression please follow up with your primary doctor or contact the wonderful folks at [facility]: [phone]  SOCIAL CONNECTIONS: -options in [place] if you wish to engage in more social and exercise related activities:  -Silver sneakers https://tools.silversneakers.com  -Walk with a Doc: Http://www.duncan-williams.com/  -Check out the [facility] Active Adults 50+ section on the [place] [employer] companies (hiking clubs, book clubs, cards and games, chess, exercise classes, aquatic classes and much more) - see the website for  details: https://www.[place].gov/departments/parks-recreation/active-adults50  -YouTube has lots of exercise videos for different ages and abilities as well  -[facility] (a variety of indoor and outdoor inperson activities for adults). [phone]. [address].  -Virtual Online Classes (a variety of topics): see seniorplanet.org or call [phone]  -consider volunteering at a school, hospice center, church, senior center or elsewhere            Chiquita JONELLE Cramp, DO

## 2023-12-30 NOTE — Patient Instructions (Addendum)
 I really enjoyed getting to talk with you today! I am available on Tuesdays and Thursdays for virtual visits if you have any questions or concerns, or if I can be of any further assistance.   CHECKLIST FROM ANNUAL WELLNESS VISIT:  -Follow up (please call to schedule if not scheduled after visit):   -yearly for annual wellness visit with primary care office  Here is a list of your preventive care/health maintenance measures and the plan for each if any are due:  PLAN For any measures below that may be due:  -can get vaccines at pharmacy  Health Maintenance  Topic Date Due   Zoster Vaccines- Shingrix (1 of 2) 08/24/1945   DTaP/Tdap/Td (2 - Tdap) 02/20/2020   COVID-19 Vaccine (6 - 2024-25 season) 10/26/2023   Medicare Annual Wellness (AWV)  12/29/2024   Pneumonia Vaccine 51+ Years old  Completed   INFLUENZA VACCINE  Completed   DEXA SCAN  Completed   HPV VACCINES  Aged Out   MAMMOGRAM  Discontinued    -See a dentist at least yearly  -Get your eyes checked and then per your eye specialist's recommendations  -Other issues addressed today:   -I have included below further information regarding a healthy whole foods based diet, physical activity guidelines for adults, stress management and opportunities for social connections. I hope you find this information useful.   -----------------------------------------------------------------------------------------------------------------------------------------------------------------------------------------------------------------------------------------------------------    NUTRITION: -eat real food: lots of colorful vegetables (half the plate) and fruits -5-7 servings of vegetables and fruits per day (fresh or steamed is best), exp. 2 servings of vegetables with lunch and dinner and 2 servings of fruit per day. Berries and greens such as kale and collards are great choices.  -consume on a regular basis:  fresh fruits, fresh veggies,  fish, nuts, seeds, healthy oils (such as olive oil, avocado oil), whole grains (make sure first ingredient on label contains the word whole), -can eat small amounts of dairy and lean meat (no larger than the palm of your hand), but avoid processed meats such as ham, bacon, lunch meat, etc. -drink water -try to avoid fast food and pre-packaged foods, processed meat, ultra processed foods (donuts, candy, etc.) -most experts advise limiting sodium to < 2300mg  per day, should limit further is any chronic conditions such as high blood pressure, heart disease, diabetes, etc. The American Heart Association advised that < 1500mg  is is ideal -try to avoid foods that contain any ingredients with names you do not recognize  -try to avoid foods with added sugar or sweeteners/sweets  -try to avoid sweet drinks -try to avoid Totty rice, Bible bread, pasta (unless whole grain)  EXERCISE GUIDELINES FOR ADULTS: -if you wish to increase your physical activity, do so gradually and with the approval of your doctor -STOP and seek medical care immediately if you have any chest pain, chest discomfort or trouble breathing when starting or increasing exercise  -move and stretch your body, legs, feet and arms when sitting for long periods -Physical activity guidelines for optimal health in adults: -get at least 150 minutes per week of moderate exercise (can talk, but not sing); this is about 20-30 minutes of sustained activity 5-7 days per week or two 10-15 minute episodes of sustained activity 5-7 days per week -do some muscle building/resistance training at least 2 days per week  -balance exercises 3+ days per week:   Stand somewhere where you have something sturdy to hold onto if you lose balance.    1) lift up on  toes, start with 5x per day and work up to 20x   2) stand and lift on leg straight out to the side so that foot is a few inches of the floor, start with 5x each side and work up to 20x each side   3) stand  on one foot, start with 5 seconds each side and work up to 20 seconds on each side  If you need ideas or help with getting more active:  -Silver sneakers https://tools.silversneakers.com  -Walk with a Doc: Http://www.duncan-williams.com/  -try to include resistance (weight lifting/strength building) and balance exercises twice per week: or the following link for ideas: http://castillo-powell.com/  buyducts.dk  STRESS MANAGEMENT: -can try meditating, or just sitting quietly with deep breathing while intentionally relaxing all parts of your body for 5 minutes daily -if you need further help with stress, anxiety or depression please follow up with your primary doctor or contact the wonderful folks at [facility]: [phone]  SOCIAL CONNECTIONS: -options in [place] if you wish to engage in more social and exercise related activities:  -Silver sneakers https://tools.silversneakers.com  -Walk with a Doc: Http://www.duncan-williams.com/  -Check out the [facility] Active Adults 50+ section on the [place] [employer] companies (hiking clubs, book clubs, cards and games, chess, exercise classes, aquatic classes and much more) - see the website for details: https://www.[place].gov/departments/parks-recreation/active-adults50  -YouTube has lots of exercise videos for different ages and abilities as well  -[facility] (a variety of indoor and outdoor inperson activities for adults). [phone]. [address]  -Virtual Online Classes (a variety of topics): see seniorplanet.org or call 9133628629  -consider volunteering at a school, hospice center, church, senior center or elsewhere

## 2024-01-05 DIAGNOSIS — H59812 Chorioretinal scars after surgery for detachment, left eye: Secondary | ICD-10-CM | POA: Diagnosis not present

## 2024-01-05 DIAGNOSIS — H3582 Retinal ischemia: Secondary | ICD-10-CM | POA: Diagnosis not present

## 2024-01-05 DIAGNOSIS — H348312 Tributary (branch) retinal vein occlusion, right eye, stable: Secondary | ICD-10-CM | POA: Diagnosis not present

## 2024-01-05 DIAGNOSIS — H43813 Vitreous degeneration, bilateral: Secondary | ICD-10-CM | POA: Diagnosis not present

## 2024-01-05 DIAGNOSIS — H401132 Primary open-angle glaucoma, bilateral, moderate stage: Secondary | ICD-10-CM | POA: Diagnosis not present

## 2024-01-05 DIAGNOSIS — H34832 Tributary (branch) retinal vein occlusion, left eye, with macular edema: Secondary | ICD-10-CM | POA: Diagnosis not present

## 2024-01-11 ENCOUNTER — Telehealth: Payer: Self-pay | Admitting: *Deleted

## 2024-01-11 DIAGNOSIS — I10 Essential (primary) hypertension: Secondary | ICD-10-CM

## 2024-01-11 MED ORDER — AMLODIPINE BESYLATE 10 MG PO TABS: 10.0000 mg | ORAL_TABLET | Freq: Every day | ORAL | 1 refills | Status: DC

## 2024-01-11 MED ORDER — ATENOLOL-CHLORTHALIDONE 50-25 MG PO TABS: 1.0000 | ORAL_TABLET | Freq: Every morning | ORAL | 1 refills | Status: DC

## 2024-01-11 NOTE — Telephone Encounter (Signed)
 Rx done.

## 2024-01-18 ENCOUNTER — Telehealth: Payer: Self-pay | Admitting: *Deleted

## 2024-01-18 DIAGNOSIS — E039 Hypothyroidism, unspecified: Secondary | ICD-10-CM

## 2024-01-18 MED ORDER — LEVOTHYROXINE SODIUM 50 MCG PO TABS: 50.0000 ug | ORAL_TABLET | Freq: Every day | ORAL | 1 refills | Status: DC

## 2024-01-18 NOTE — Telephone Encounter (Signed)
 Rx done.

## 2024-01-19 ENCOUNTER — Telehealth: Payer: Self-pay | Admitting: *Deleted

## 2024-01-19 DIAGNOSIS — E785 Hyperlipidemia, unspecified: Secondary | ICD-10-CM

## 2024-01-19 DIAGNOSIS — I10 Essential (primary) hypertension: Secondary | ICD-10-CM

## 2024-01-19 MED ORDER — SIMVASTATIN 20 MG PO TABS: ORAL_TABLET | ORAL | 1 refills | Status: DC

## 2024-01-19 MED ORDER — LISINOPRIL 40 MG PO TABS: 40.0000 mg | ORAL_TABLET | Freq: Every day | ORAL | 1 refills | Status: DC

## 2024-01-19 NOTE — Telephone Encounter (Signed)
 Rx done.

## 2024-03-28 ENCOUNTER — Encounter: Payer: Self-pay | Admitting: Podiatry

## 2024-03-28 ENCOUNTER — Ambulatory Visit: Payer: HMO | Admitting: Podiatry

## 2024-03-28 DIAGNOSIS — M79674 Pain in right toe(s): Secondary | ICD-10-CM | POA: Diagnosis not present

## 2024-03-28 DIAGNOSIS — M79675 Pain in left toe(s): Secondary | ICD-10-CM

## 2024-03-28 DIAGNOSIS — B351 Tinea unguium: Secondary | ICD-10-CM

## 2024-03-28 DIAGNOSIS — N182 Chronic kidney disease, stage 2 (mild): Secondary | ICD-10-CM

## 2024-03-28 NOTE — Progress Notes (Signed)
This patient returns to my office for at risk foot care.  This patient requires this care by a professional since this patient will be at risk due to having CKD.   This patient presents to the office with her daughter.  This patient is unable to cut nails herself since the patient cannot reach her nails.These nails are painful walking and wearing shoes.  This patient presents for at risk foot care today.  General Appearance  Alert, conversant and in no acute stress.  Vascular  Dorsalis pedis and posterior tibial  pulses are  weakly palpable  bilaterally.  Capillary return is within normal limits  bilaterally. Cold feet bilaterally.  Neurologic  Senn-Weinstein monofilament wire test within normal limits  bilaterally. Muscle power within normal limits bilaterally.  Nails Thick disfigured discolored nails with subungual debris  hallux toenails bilaterally. No evidence of bacterial infection or drainage bilaterally.  Orthopedic  No limitations of motion  feet .  No crepitus or effusions noted.  No bony pathology or digital deformities noted.  Skin  normotropic skin with no porokeratosis noted bilaterally.  No signs of infections or ulcers noted.     Onychomycosis  Pain in right toes  Pain in left toes  Consent was obtained for treatment procedures.   Mechanical debridement of nails 1-5  bilaterally performed with a nail nipper.  Filed with dremel without incident.    Return office visit   6  months                   Told patient to return for periodic foot care and evaluation due to potential at risk complications.   Gardiner Barefoot DPM

## 2024-03-29 DIAGNOSIS — H348332 Tributary (branch) retinal vein occlusion, bilateral, stable: Secondary | ICD-10-CM | POA: Diagnosis not present

## 2024-03-29 DIAGNOSIS — H401132 Primary open-angle glaucoma, bilateral, moderate stage: Secondary | ICD-10-CM | POA: Diagnosis not present

## 2024-03-29 DIAGNOSIS — H59812 Chorioretinal scars after surgery for detachment, left eye: Secondary | ICD-10-CM | POA: Diagnosis not present

## 2024-03-29 DIAGNOSIS — H3582 Retinal ischemia: Secondary | ICD-10-CM | POA: Diagnosis not present

## 2024-03-29 DIAGNOSIS — H43813 Vitreous degeneration, bilateral: Secondary | ICD-10-CM | POA: Diagnosis not present

## 2024-04-12 DIAGNOSIS — N3946 Mixed incontinence: Secondary | ICD-10-CM | POA: Diagnosis not present

## 2024-04-12 DIAGNOSIS — R350 Frequency of micturition: Secondary | ICD-10-CM | POA: Diagnosis not present

## 2024-06-21 DIAGNOSIS — H59812 Chorioretinal scars after surgery for detachment, left eye: Secondary | ICD-10-CM | POA: Diagnosis not present

## 2024-06-21 DIAGNOSIS — H348332 Tributary (branch) retinal vein occlusion, bilateral, stable: Secondary | ICD-10-CM | POA: Diagnosis not present

## 2024-06-21 DIAGNOSIS — H43813 Vitreous degeneration, bilateral: Secondary | ICD-10-CM | POA: Diagnosis not present

## 2024-06-21 DIAGNOSIS — H3582 Retinal ischemia: Secondary | ICD-10-CM | POA: Diagnosis not present

## 2024-06-21 DIAGNOSIS — H35373 Puckering of macula, bilateral: Secondary | ICD-10-CM | POA: Diagnosis not present

## 2024-06-21 DIAGNOSIS — H401132 Primary open-angle glaucoma, bilateral, moderate stage: Secondary | ICD-10-CM | POA: Diagnosis not present

## 2024-08-08 ENCOUNTER — Telehealth: Payer: Self-pay

## 2024-08-08 NOTE — Progress Notes (Signed)
   08/08/2024  Patient ID: Alison Cordova Pizza, female   DOB: [birth year], 89 y.o.   MRN: [id number]  Pharmacy Quality Measure Review  This patient is appearing on a report for being at risk of failing the adherence measure for hypertension (ACEi/ARB) medications this calendar year.   Medication: Lisinopril  40mg  Last fill date: 05/29/24 for 90 day supply  No refills remaining on rx for when it comes time to refill in October. Patient past due for 6 month f/u with PCP. Scheduled.  Jon Cordova Lindau, PharmD Clinical Pharmacist [phone]

## 2024-08-09 ENCOUNTER — Telehealth: Payer: Self-pay | Admitting: *Deleted

## 2024-08-09 DIAGNOSIS — I10 Essential (primary) hypertension: Secondary | ICD-10-CM

## 2024-08-09 MED ORDER — ATENOLOL-CHLORTHALIDONE 50-25 MG PO TABS: 1.0000 | ORAL_TABLET | Freq: Every morning | ORAL | 1 refills | Status: DC

## 2024-08-09 MED ORDER — AMLODIPINE BESYLATE 10 MG PO TABS: 10.0000 mg | ORAL_TABLET | Freq: Every day | ORAL | 1 refills | Status: DC

## 2024-08-09 NOTE — Telephone Encounter (Signed)
 Rx done.

## 2024-08-10 ENCOUNTER — Encounter: Payer: Self-pay | Admitting: Family Medicine

## 2024-08-10 ENCOUNTER — Ambulatory Visit (INDEPENDENT_AMBULATORY_CARE_PROVIDER_SITE_OTHER): Admitting: Family Medicine

## 2024-08-10 VITALS — BP 136/58 | HR 60 | Temp 97.8°F | Ht <= 58 in | Wt 92.2 lb

## 2024-08-10 DIAGNOSIS — R0981 Nasal congestion: Secondary | ICD-10-CM

## 2024-08-10 DIAGNOSIS — Z131 Encounter for screening for diabetes mellitus: Secondary | ICD-10-CM | POA: Diagnosis not present

## 2024-08-10 DIAGNOSIS — N1832 Chronic kidney disease, stage 3b: Secondary | ICD-10-CM

## 2024-08-10 DIAGNOSIS — E785 Hyperlipidemia, unspecified: Secondary | ICD-10-CM

## 2024-08-10 DIAGNOSIS — E039 Hypothyroidism, unspecified: Secondary | ICD-10-CM

## 2024-08-10 DIAGNOSIS — I10 Essential (primary) hypertension: Secondary | ICD-10-CM

## 2024-08-10 DIAGNOSIS — Z23 Encounter for immunization: Secondary | ICD-10-CM | POA: Diagnosis not present

## 2024-08-10 LAB — CBC WITH DIFFERENTIAL/PLATELET
Basophils Absolute: 0 K/uL (ref 0.0–0.1)
Basophils Relative: 0.6 % (ref 0.0–3.0)
Eosinophils Absolute: 0 K/uL (ref 0.0–0.7)
Eosinophils Relative: 0.4 % (ref 0.0–5.0)
HCT: 39.5 % (ref 36.0–46.0)
Hemoglobin: 13.2 g/dL (ref 12.0–15.0)
Lymphocytes Relative: 19.2 % (ref 12.0–46.0)
Lymphs Abs: 1.1 K/uL (ref 0.7–4.0)
MCHC: 33.4 g/dL (ref 30.0–36.0)
MCV: 85.5 fl (ref 78.0–100.0)
Monocytes Absolute: 1.1 K/uL — ABNORMAL HIGH (ref 0.1–1.0)
Monocytes Relative: 19.4 % — ABNORMAL HIGH (ref 3.0–12.0)
Neutro Abs: 3.5 K/uL (ref 1.4–7.7)
Neutrophils Relative %: 60.4 % (ref 43.0–77.0)
Platelets: 229 K/uL (ref 150.0–400.0)
RBC: 4.62 Mil/uL (ref 3.87–5.11)
RDW: 14.3 % (ref 11.5–15.5)
WBC: 5.8 K/uL (ref 4.0–10.5)

## 2024-08-10 LAB — LIPID PANEL
Cholesterol: 146 mg/dL (ref 0–200)
HDL: 58.7 mg/dL (ref >39.00)
LDL Cholesterol: 73 mg/dL (ref 0–99)
NonHDL: 87.32
Total CHOL/HDL Ratio: 2
Triglycerides: 72 mg/dL (ref 0.0–149.0)
VLDL: 14.4 mg/dL (ref 0.0–40.0)

## 2024-08-10 LAB — COMPREHENSIVE METABOLIC PANEL WITH GFR
ALT: 6 U/L (ref 0–35)
AST: 12 U/L (ref 0–37)
Albumin: 4.3 g/dL (ref 3.5–5.2)
Alkaline Phosphatase: 57 U/L (ref 39–117)
BUN: 33 mg/dL — ABNORMAL HIGH (ref 6–23)
CO2: 28 meq/L (ref 19–32)
Calcium: 10.1 mg/dL (ref 8.4–10.5)
Chloride: 101 meq/L (ref 96–112)
Creatinine, Ser: 1.58 mg/dL — ABNORMAL HIGH (ref 0.40–1.20)
GFR: 27.57 mL/min — ABNORMAL LOW (ref >60.00)
Glucose, Bld: 88 mg/dL (ref 70–99)
Potassium: 4.2 meq/L (ref 3.5–5.1)
Sodium: 139 meq/L (ref 135–145)
Total Bilirubin: 0.6 mg/dL (ref 0.2–1.2)
Total Protein: 7.4 g/dL (ref 6.0–8.3)

## 2024-08-10 LAB — HEMOGLOBIN A1C: Hgb A1c MFr Bld: 5.6 % (ref 4.6–6.5)

## 2024-08-10 LAB — TSH: TSH: 3.77 u[IU]/mL (ref 0.35–5.50)

## 2024-08-10 MED ORDER — LISINOPRIL 40 MG PO TABS: 40.0000 mg | ORAL_TABLET | Freq: Every day | ORAL | 1 refills | Status: DC

## 2024-08-10 MED ORDER — LEVOTHYROXINE SODIUM 50 MCG PO TABS: 50.0000 ug | ORAL_TABLET | Freq: Every day | ORAL | 1 refills | Status: DC

## 2024-08-10 MED ORDER — SIMVASTATIN 20 MG PO TABS: ORAL_TABLET | ORAL | 1 refills | Status: DC

## 2024-08-10 MED ORDER — IPRATROPIUM BROMIDE 0.03 % NA SOLN: 2.0000 | Freq: Two times a day (BID) | NASAL | 1 refills | Status: DC

## 2024-08-10 NOTE — Progress Notes (Signed)
 Established Patient Office Visit  Subjective   Patient ID: Alison Cordova, female    DOB: 1926/06/21  Age: 89 y.o. MRN: [id number]  Chief Complaint  Patient presents with   Medical Management of Chronic Issues    HPI  Discussed the use of AI scribe software for clinical note transcription with the patient, who gave verbal consent to proceed.  History of Present Illness   Alison Cordova is a 89 year old female who presents for a six-month follow-up visit.  She has been doing well overall with no new symptoms reported. She experiences intermittent sinus issues, which are managed with zyrtec  and nasal spray. She avoids opening windows to minimize exposure to allergens, although she enjoys the fresh air on nice days.  Her current medications include thyroid  medication, blood pressure medication, cholesterol medication, baby aspirin, and Myrbetriq  for bladder issues, which she takes on Monday, Wednesday, and Friday. She also takes Preservision for her eyes, cetirizine  for allergies, and uses timolol eye drops. She no longer receives eye injections as her ophthalmologist deemed them unnecessary unless vision changes occur.  She has a history of osteoporosis diagnosed in 2015, but she has not been on any specific treatment for it. Her ambulation is generally good with the use of a cane, although she requires assistance with showering. She uses a pedal exerciser to maintain leg movement.  Her weight has decreased slightly from 95 pounds to 92 pounds since her last visit. She has a reduced appetite, often refusing meals, but enjoys candy and flavored carbonated water. She eats a fried egg almost every morning and occasionally has pancakes.  Her bowel movements are regular, occurring most days. She is hard of hearing and uses hearing aids, although their effectiveness is uncertain. Closed captioning is used on the TV to assist her hearing.  She has a history of stable kidney function with a  creatinine level of 1.5. She drinks flavored carbonated water, specifically black cherry, as her preferred beverage.      Current Outpatient Medications  Medication Instructions   acetaminophen (TYLENOL) 650 mg, Every 8 hours PRN   amLODipine  (NORVASC ) 10 mg, Oral, Daily   aspirin EC 81 mg, Daily   atenolol -chlorthalidone  (TENORETIC ) 50-25 MG tablet 1 tablet, Oral, Every morning   cetirizine  (ZYRTEC ) 10 mg, Oral, Daily   fluticasone  (FLONASE ) 50 MCG/ACT nasal spray 1 spray, 2 times daily   ipratropium (ATROVENT ) 0.03 % nasal spray 2 sprays, Each Nare, Every 12 hours   latanoprost (XALATAN) 0.005 % ophthalmic solution 1 drop, Daily at bedtime   levothyroxine  (SYNTHROID ) 50 mcg, Oral, Daily   lisinopril  (ZESTRIL ) 40 mg, Oral, Daily at bedtime   mirabegron  ER (MYRBETRIQ ) 50 mg, 3 times weekly   Multiple Vitamins-Minerals (PRESERVISION AREDS PO) Daily   Multiple Vitamins-Minerals (WOMENS MULTIVITAMIN PLUS PO) 1 tablet, Daily   NON FORMULARY Post Surgical Bras   NON FORMULARY Non-Silicone Breast Prosthesis   simvastatin  (ZOCOR ) 20 MG tablet TAKE 1 TABLET BY MOUTH EVERYDAY AT BEDTIME   timolol (BETIMOL) 0.5 % ophthalmic solution 1 drop, 2 times daily    Patient Active Problem List   Diagnosis Date Noted   Body mass index (BMI) of 19.0-19.9 in adult 08/14/2022   Hyperlipemia 03/27/2016   Anemia 06/17/2012   CKD (chronic kidney disease) stage 3, GFR 30-59 ml/min (HCC) 06/17/2012   Cerebrovascular disease 06/27/2010   Malignant neoplasm of female breast (HCC) 02/19/2010   Glaucoma 09/02/2008   Hypothyroidism 03/14/2008   Allergic rhinitis 03/14/2008  Urinary incontinence 03/14/2008   Essential hypertension 09/17/2007   GERD 09/17/2007     Review of Systems  All other systems reviewed and are negative.     Objective:     BP (!) 136/58   Pulse 60   Temp 97.8 F (36.6 C) (Oral)   Ht 4' 8.5 (1.435 m)   Wt 92 lb 3.2 oz (41.8 kg)   SpO2 96%   BMI 20.31 kg/m    Physical  Exam Vitals reviewed.  Constitutional:      Appearance: Normal appearance. She is well-groomed and underweight.  Eyes:     Conjunctiva/sclera: Conjunctivae normal.  Neck:     Thyroid : No thyromegaly.  Cardiovascular:     Rate and Rhythm: Normal rate and regular rhythm.     Heart sounds: S1 normal and S2 normal.  Pulmonary:     Effort: Pulmonary effort is normal.     Breath sounds: Normal breath sounds and air entry.  Abdominal:     General: Bowel sounds are normal.  Musculoskeletal:     Right lower leg: No edema.     Left lower leg: No edema.  Neurological:     Mental Status: She is alert. Mental status is at baseline.     Gait: Gait is intact.     Comments: Follows commands, does not speak much but will answer questions when prompted. Oriented to person and place  Psychiatric:        Mood and Affect: Mood normal. Affect is flat.        Speech: Speech normal.        Behavior: Behavior normal.      No results found for any visits on 08/10/24.    The ASCVD Risk score (Arnett DK, et al., 2019) failed to calculate for the following reasons:   The 2019 ASCVD risk score is only valid for ages 29 to 76   Risk score cannot be calculated because patient has a medical history suggesting prior/existing ASCVD    Assessment & Plan:  Immunization due -     Flu vaccine HIGH DOSE PF(Fluzone Trivalent)  Nasal congestion -     Ipratropium Bromide ; Place 2 sprays into both nostrils every 12 (twelve) hours.  Dispense: 60 mL; Refill: 1  Hypothyroidism, unspecified type -     Levothyroxine  Sodium; Take 1 tablet (50 mcg total) by mouth daily.  Dispense: 90 tablet; Refill: 1 -     TSH; Future  Essential hypertension -     Lisinopril ; Take 1 tablet (40 mg total) by mouth at bedtime.  Dispense: 90 tablet; Refill: 1 -     CBC with Differential/Platelet; Future -     Comprehensive metabolic panel with GFR; Future  Hyperlipidemia, unspecified hyperlipidemia type -     Simvastatin ; TAKE 1  TABLET BY MOUTH EVERYDAY AT BEDTIME  Dispense: 90 tablet; Refill: 1 -     Lipid panel; Future  Stage 3b chronic kidney disease (HCC) -     CBC with Differential/Platelet; Future -     Comprehensive metabolic panel with GFR; Future  Diabetes mellitus screening -     Hemoglobin A1c; Future   Assessment and Plan    Chronic kidney disease stage 3 Well-managed with stable creatinine levels. - Order kidney function tests.  Essential hypertension Blood pressure well-controlled with current medications. - Continue current antihypertensive medications listed below: Current hypertension medications:       Sig   amLODipine  (NORVASC ) 10 MG tablet (Taking) Take 1 tablet (10  mg total) by mouth daily.   atenolol -chlorthalidone  (TENORETIC ) 50-25 MG tablet (Taking) Take 1 tablet by mouth every morning.   lisinopril  (ZESTRIL ) 40 MG tablet Take 1 tablet (40 mg total) by mouth at bedtime.        Hypothyroidism Well-managed on current medication. - Order thyroid  function tests.  Hyperlipidemia Well-managed on current medication. - Order lipid panel.  Impaired mobility Dependent on cane, uses pedal exerciser. - Encourage use of pedal exerciser. - Ensure use of cane for ambulation.  Dependence on assistance for activities of daily living Requires assistance with showering, slight weight decrease, preference for candy. - Encourage nutritional supplements like Ensure. - Monitor weight and dietary intake.  Nasal congestion Intermittent, managed with quercetin and cetirizine . - Continue quercetin and cetirizine  as needed.        Return in about 6 months (around 02/07/2025) for HTN.    Heron CHRISTELLA Sharper, MD

## 2024-08-18 ENCOUNTER — Ambulatory Visit: Payer: Self-pay | Admitting: Family Medicine

## 2024-09-05 ENCOUNTER — Telehealth: Payer: Self-pay

## 2024-09-05 NOTE — Progress Notes (Signed)
   09/05/2024  Patient ID: Alison Cordova, female   DOB: 1926-09-21, 89 y.o.   MRN: [id number]  Pharmacy Quality Measure Review  This patient is appearing on a report for being at risk of failing the adherence measure for hypertension (ACEi/ARB) medications this calendar year.   Medication: Lisinopril  40mg  Last fill date: 05/29/24 for 90 day supply  Contacted patient to discuss. She confirms she still has some medication on hand, denies missed doses. Prefers to contact pharmacy when ready for refill.  Jon VEAR Lindau, PharmD Clinical Pharmacist [phone]

## 2024-09-15 NOTE — Progress Notes (Signed)
   09/15/2024  Patient ID: Alison Cordova, female   DOB: [birth year], 89 y.o.   MRN: [id number]  Pharmacy Quality Measure Review  This patient is appearing on a report for being at risk of failing the adherence measure for cholesterol (statin) medications this calendar year.   Medication: Simvastatin  Last fill date: 08/23/24 for 90 day supply  Insurance report was not up to date. No action needed at this time.   [name], PharmD Clinical Pharmacist [phone]

## 2024-09-28 ENCOUNTER — Ambulatory Visit: Admitting: Podiatry

## 2024-09-28 ENCOUNTER — Encounter: Payer: Self-pay | Admitting: Podiatry

## 2024-09-28 DIAGNOSIS — N182 Chronic kidney disease, stage 2 (mild): Secondary | ICD-10-CM

## 2024-09-28 DIAGNOSIS — M79675 Pain in left toe(s): Secondary | ICD-10-CM

## 2024-09-28 DIAGNOSIS — M79674 Pain in right toe(s): Secondary | ICD-10-CM

## 2024-09-28 DIAGNOSIS — B351 Tinea unguium: Secondary | ICD-10-CM | POA: Diagnosis not present

## 2024-09-28 NOTE — Progress Notes (Signed)
This patient returns to my office for at risk foot care.  This patient requires this care by a professional since this patient will be at risk due to having CKD.   This patient presents to the office with her daughter.  This patient is unable to cut nails herself since the patient cannot reach her nails.These nails are painful walking and wearing shoes.  This patient presents for at risk foot care today.  General Appearance  Alert, conversant and in no acute stress.  Vascular  Dorsalis pedis and posterior tibial  pulses are  weakly palpable  bilaterally.  Capillary return is within normal limits  bilaterally. Cold feet bilaterally.  Neurologic  Senn-Weinstein monofilament wire test within normal limits  bilaterally. Muscle power within normal limits bilaterally.  Nails Thick disfigured discolored nails with subungual debris  hallux toenails bilaterally. No evidence of bacterial infection or drainage bilaterally.  Orthopedic  No limitations of motion  feet .  No crepitus or effusions noted.  No bony pathology or digital deformities noted.  Skin  normotropic skin with no porokeratosis noted bilaterally.  No signs of infections or ulcers noted.     Onychomycosis  Pain in right toes  Pain in left toes  Consent was obtained for treatment procedures.   Mechanical debridement of nails 1-5  bilaterally performed with a nail nipper.  Filed with dremel without incident.    Return office visit   6  months                   Told patient to return for periodic foot care and evaluation due to potential at risk complications.   Gardiner Barefoot DPM

## 2024-12-12 ENCOUNTER — Telehealth: Payer: Self-pay | Admitting: *Deleted

## 2024-12-12 DIAGNOSIS — R0981 Nasal congestion: Secondary | ICD-10-CM

## 2024-12-12 DIAGNOSIS — E039 Hypothyroidism, unspecified: Secondary | ICD-10-CM

## 2024-12-12 DIAGNOSIS — E785 Hyperlipidemia, unspecified: Secondary | ICD-10-CM

## 2024-12-12 DIAGNOSIS — I10 Essential (primary) hypertension: Secondary | ICD-10-CM

## 2024-12-12 MED ORDER — AMLODIPINE BESYLATE 10 MG PO TABS: 10.0000 mg | ORAL_TABLET | Freq: Every day | ORAL | 1 refills | Status: AC

## 2024-12-12 MED ORDER — SIMVASTATIN 20 MG PO TABS: ORAL_TABLET | ORAL | 1 refills | Status: AC

## 2024-12-12 MED ORDER — IPRATROPIUM BROMIDE 0.03 % NA SOLN: 2.0000 | Freq: Two times a day (BID) | NASAL | 1 refills | Status: AC

## 2024-12-12 MED ORDER — ATENOLOL-CHLORTHALIDONE 50-25 MG PO TABS: 1.0000 | ORAL_TABLET | Freq: Every morning | ORAL | 1 refills | Status: AC

## 2024-12-12 MED ORDER — LISINOPRIL 40 MG PO TABS: 40.0000 mg | ORAL_TABLET | Freq: Every day | ORAL | 1 refills | Status: AC

## 2024-12-12 MED ORDER — LEVOTHYROXINE SODIUM 50 MCG PO TABS: 50.0000 ug | ORAL_TABLET | Freq: Every day | ORAL | 1 refills | Status: AC

## 2024-12-12 NOTE — Telephone Encounter (Signed)
 Copied from CRM [id number]. Topic: Clinical - Medication Refill >> Dec 12, 2024 11:50 AM Alfonso HERO wrote: Medication: acetaminophen (TYLENOL) 650 MG CR tablet  amLODipine  (NORVASC ) 10 MG tablet aspirin EC 81 MG tablet  atenolol -chlorthalidone  (TENORETIC ) 50-25 MG tablet  cetirizine  (ZYRTEC ) 10 MG tablet fluticasone  (FLONASE ) 50 MCG/ACT nasal spray  ipratropium (ATROVENT ) 0.03 % nasal spray latanoprost (XALATAN) 0.005 % ophthalmic solution  levothyroxine  (SYNTHROID ) 50 MCG tablet  lisinopril  (ZESTRIL ) 40 MG tablet mirabegron  ER (MYRBETRIQ ) 50 MG TB24 tablet Multiple Vitamins-Minerals (PRESERVISION AREDS PO) Multiple Vitamins-Minerals (WOMENS MULTIVITAMIN PLUS PO) NON FORMULARY NON FORMULARY  simvastatin  (ZOCOR ) 20 MG tablet timolol (BETIMOL) 0.5 % ophthalmic solution   Has the patient contacted their pharmacy? Yes (Agent: If no, request that the patient contact the pharmacy for the refill. If patient does not wish to contact the pharmacy document the reason why and proceed with request.) (Agent: If yes, when and what did the pharmacy advise?)  This is the patient's preferred pharmacy:   [id number] - [address] [place] [address] Phone: [phone] Fax: [phone]  Is this the correct pharmacy for this prescription? Yes If no, delete pharmacy and type the correct one.   Has the prescription been filled recently? Yes  Is the patient out of the medication? Yes  Has the patient been seen for an appointment in the last year OR does the patient have an upcoming appointment? Yes  Can we respond through MyChart? Yes  Agent: Please be advised that Rx refills may take up to 3 business days. We ask that you follow-up with your pharmacy.  Patient changed insurance and all of her [facility].[email or web address]

## 2024-12-12 NOTE — Telephone Encounter (Signed)
 Rxs previously prescribed by Dr Ozell was [facility].  Message sent to PCP as other Rxs were listed as historical meds and/or OTC.

## 2024-12-13 ENCOUNTER — Other Ambulatory Visit: Payer: Self-pay | Admitting: Family Medicine

## 2024-12-13 NOTE — Telephone Encounter (Signed)
 Vitamins and non formulary cannot be sent, tylenol is over the counter and the eye drops should be prescribed by her ophthalmologist.

## 2024-12-13 NOTE — Telephone Encounter (Signed)
 Left a detailed message with the information below on Susan's (patient's daughter) voicemail.

## 2025-02-07 ENCOUNTER — Ambulatory Visit: Admitting: Family Medicine

## 2025-03-28 ENCOUNTER — Ambulatory Visit: Admitting: Podiatry
